# Patient Record
Sex: Female | Born: 1950 | Race: White | Hispanic: No | Marital: Married | Smoking: Former smoker
Health system: Southern US, Community
[De-identification: ages and names within clinical notes are randomized; demographics above are authoritative.]

## PROBLEM LIST (undated history)

## (undated) DIAGNOSIS — Z973 Presence of spectacles and contact lenses: Secondary | ICD-10-CM

## (undated) DIAGNOSIS — E785 Hyperlipidemia, unspecified: Secondary | ICD-10-CM

## (undated) DIAGNOSIS — K219 Gastro-esophageal reflux disease without esophagitis: Secondary | ICD-10-CM

## (undated) DIAGNOSIS — K08109 Complete loss of teeth, unspecified cause, unspecified class: Secondary | ICD-10-CM

## (undated) DIAGNOSIS — C801 Malignant (primary) neoplasm, unspecified: Secondary | ICD-10-CM

## (undated) DIAGNOSIS — I209 Angina pectoris, unspecified: Secondary | ICD-10-CM

## (undated) DIAGNOSIS — E119 Type 2 diabetes mellitus without complications: Secondary | ICD-10-CM

## (undated) DIAGNOSIS — R0602 Shortness of breath: Secondary | ICD-10-CM

## (undated) DIAGNOSIS — E669 Obesity, unspecified: Secondary | ICD-10-CM

## (undated) DIAGNOSIS — C44311 Basal cell carcinoma of skin of nose: Secondary | ICD-10-CM

## (undated) DIAGNOSIS — M1990 Unspecified osteoarthritis, unspecified site: Secondary | ICD-10-CM

## (undated) DIAGNOSIS — I2510 Atherosclerotic heart disease of native coronary artery without angina pectoris: Principal | ICD-10-CM

## (undated) DIAGNOSIS — M797 Fibromyalgia: Secondary | ICD-10-CM

## (undated) DIAGNOSIS — C539 Malignant neoplasm of cervix uteri, unspecified: Secondary | ICD-10-CM

## (undated) DIAGNOSIS — I10 Essential (primary) hypertension: Secondary | ICD-10-CM

## (undated) DIAGNOSIS — Z972 Presence of dental prosthetic device (complete) (partial): Secondary | ICD-10-CM

## (undated) HISTORY — DX: Gastro-esophageal reflux disease without esophagitis: K21.9

## (undated) HISTORY — DX: Obesity, unspecified: E66.9

## (undated) HISTORY — DX: Hyperlipidemia, unspecified: E78.5

## (undated) HISTORY — DX: Malignant (primary) neoplasm, unspecified: C80.1

## (undated) HISTORY — DX: Essential (primary) hypertension: I10

## (undated) HISTORY — PX: CORONARY ANGIOPLASTY WITH STENT PLACEMENT: SHX49

## (undated) HISTORY — DX: Type 2 diabetes mellitus without complications: E11.9

## (undated) HISTORY — DX: Fibromyalgia: M79.7

## (undated) HISTORY — PX: FRACTURE SURGERY: SHX138

---

## 1977-10-31 HISTORY — PX: TUBAL LIGATION: SHX77

## 1984-10-31 DIAGNOSIS — C539 Malignant neoplasm of cervix uteri, unspecified: Secondary | ICD-10-CM

## 1984-10-31 HISTORY — DX: Malignant neoplasm of cervix uteri, unspecified: C53.9

## 1985-03-31 HISTORY — PX: CERVICAL CONE BIOPSY: SUR198

## 1992-07-01 HISTORY — PX: CARPAL TUNNEL RELEASE: SHX101

## 1998-06-11 ENCOUNTER — Other Ambulatory Visit: Admission: RE | Admit: 1998-06-11 | Discharge: 1998-06-11 | Payer: Self-pay | Admitting: Obstetrics and Gynecology

## 2000-04-06 ENCOUNTER — Other Ambulatory Visit: Admission: RE | Admit: 2000-04-06 | Discharge: 2000-04-06 | Payer: Self-pay | Admitting: Obstetrics and Gynecology

## 2002-01-22 ENCOUNTER — Other Ambulatory Visit: Admission: RE | Admit: 2002-01-22 | Discharge: 2002-01-22 | Payer: Self-pay | Admitting: Obstetrics and Gynecology

## 2003-01-27 ENCOUNTER — Encounter: Payer: Self-pay | Admitting: Emergency Medicine

## 2003-01-27 ENCOUNTER — Emergency Department (HOSPITAL_COMMUNITY): Admission: EM | Admit: 2003-01-27 | Discharge: 2003-01-27 | Payer: Self-pay | Admitting: Emergency Medicine

## 2003-08-04 ENCOUNTER — Emergency Department (HOSPITAL_COMMUNITY): Admission: EM | Admit: 2003-08-04 | Discharge: 2003-08-04 | Payer: Self-pay | Admitting: Emergency Medicine

## 2004-10-31 HISTORY — PX: SHOULDER OPEN ROTATOR CUFF REPAIR: SHX2407

## 2007-05-11 ENCOUNTER — Inpatient Hospital Stay (HOSPITAL_COMMUNITY): Admission: EM | Admit: 2007-05-11 | Discharge: 2007-05-15 | Payer: Self-pay | Admitting: Emergency Medicine

## 2007-05-11 ENCOUNTER — Ambulatory Visit: Payer: Self-pay | Admitting: Infectious Diseases

## 2008-05-31 DIAGNOSIS — C44311 Basal cell carcinoma of skin of nose: Secondary | ICD-10-CM

## 2008-05-31 HISTORY — PX: MOHS SURGERY: SHX181

## 2008-05-31 HISTORY — DX: Basal cell carcinoma of skin of nose: C44.311

## 2009-07-31 DIAGNOSIS — I219 Acute myocardial infarction, unspecified: Secondary | ICD-10-CM

## 2009-07-31 HISTORY — DX: Acute myocardial infarction, unspecified: I21.9

## 2009-08-06 ENCOUNTER — Ambulatory Visit: Payer: Self-pay | Admitting: Cardiology

## 2009-08-07 ENCOUNTER — Observation Stay (HOSPITAL_COMMUNITY): Admission: EM | Admit: 2009-08-07 | Discharge: 2009-08-08 | Payer: Self-pay | Admitting: Emergency Medicine

## 2009-09-11 ENCOUNTER — Emergency Department (HOSPITAL_COMMUNITY): Admission: EM | Admit: 2009-09-11 | Discharge: 2009-09-11 | Payer: Self-pay | Admitting: Emergency Medicine

## 2009-09-23 ENCOUNTER — Inpatient Hospital Stay (HOSPITAL_COMMUNITY): Admission: AD | Admit: 2009-09-23 | Discharge: 2009-09-24 | Payer: Self-pay | Admitting: Interventional Cardiology

## 2011-02-02 LAB — DIFFERENTIAL
Eosinophils Absolute: 0.2 10*3/uL (ref 0.0–0.7)
Lymphs Abs: 1.4 10*3/uL (ref 0.7–4.0)
Monocytes Absolute: 0.6 10*3/uL (ref 0.1–1.0)
Monocytes Relative: 8 % (ref 3–12)
Neutro Abs: 5.7 10*3/uL (ref 1.7–7.7)
Neutrophils Relative %: 72 % (ref 43–77)

## 2011-02-02 LAB — CBC
Hemoglobin: 12.6 g/dL (ref 12.0–15.0)
Hemoglobin: 13.4 g/dL (ref 12.0–15.0)
MCHC: 34.8 g/dL (ref 30.0–36.0)
MCHC: 35.2 g/dL (ref 30.0–36.0)
Platelets: 224 10*3/uL (ref 150–400)
Platelets: 258 10*3/uL (ref 150–400)
RDW: 13.9 % (ref 11.5–15.5)
RDW: 13.7 % (ref 11.5–15.5)

## 2011-02-02 LAB — GLUCOSE, CAPILLARY
Glucose-Capillary: 151 mg/dL — ABNORMAL HIGH (ref 70–99)
Glucose-Capillary: 165 mg/dL — ABNORMAL HIGH (ref 70–99)
Glucose-Capillary: 194 mg/dL — ABNORMAL HIGH (ref 70–99)

## 2011-02-02 LAB — APTT: aPTT: 24 seconds (ref 24–37)

## 2011-02-02 LAB — COMPREHENSIVE METABOLIC PANEL
ALT: 28 U/L (ref 0–35)
Albumin: 4 g/dL (ref 3.5–5.2)
Calcium: 9.7 mg/dL (ref 8.4–10.5)
Glucose, Bld: 205 mg/dL — ABNORMAL HIGH (ref 70–99)
Sodium: 135 mEq/L (ref 135–145)
Total Protein: 6.9 g/dL (ref 6.0–8.3)

## 2011-02-02 LAB — BASIC METABOLIC PANEL
BUN: 11 mg/dL (ref 6–23)
CO2: 26 mEq/L (ref 19–32)
Calcium: 8.9 mg/dL (ref 8.4–10.5)
Creatinine, Ser: 0.69 mg/dL (ref 0.4–1.2)
GFR calc non Af Amer: >60 mL/min (ref >60)
Glucose, Bld: 171 mg/dL — ABNORMAL HIGH (ref 70–99)

## 2011-02-02 LAB — POCT CARDIAC MARKERS
CKMB, poc: <1 ng/mL — ABNORMAL LOW (ref 1.0–8.0)
Myoglobin, poc: 68.7 ng/mL (ref 12–200)

## 2011-02-02 LAB — PROTIME-INR: INR: 1.03 (ref 0.00–1.49)

## 2011-02-03 LAB — CBC
HCT: 38.2 % (ref 36.0–46.0)
Hemoglobin: 13.2 g/dL (ref 12.0–15.0)
MCHC: 34.6 g/dL (ref 30.0–36.0)
MCV: 91 fL (ref 78.0–100.0)
Platelets: 218 10*3/uL (ref 150–400)
Platelets: 248 10*3/uL (ref 150–400)
RBC: 4.2 MIL/uL (ref 3.87–5.11)
RDW: 12.9 % (ref 11.5–15.5)
RDW: 13 % (ref 11.5–15.5)
WBC: 7.9 10*3/uL (ref 4.0–10.5)

## 2011-02-03 LAB — BASIC METABOLIC PANEL
BUN: 13 mg/dL (ref 6–23)
Calcium: 10 mg/dL (ref 8.4–10.5)
Chloride: 105 mEq/L (ref 96–112)
GFR calc Af Amer: >60 mL/min (ref >60)
GFR calc non Af Amer: >60 mL/min (ref >60)
Glucose, Bld: 334 mg/dL — ABNORMAL HIGH (ref 70–99)
Potassium: 3.7 mEq/L (ref 3.5–5.1)
Sodium: 136 mEq/L (ref 135–145)

## 2011-02-03 LAB — GLUCOSE, CAPILLARY
Glucose-Capillary: 147 mg/dL — ABNORMAL HIGH (ref 70–99)
Glucose-Capillary: 238 mg/dL — ABNORMAL HIGH (ref 70–99)
Glucose-Capillary: 240 mg/dL — ABNORMAL HIGH (ref 70–99)

## 2011-02-03 LAB — DIFFERENTIAL
Basophils Absolute: 0 10*3/uL (ref 0.0–0.1)
Eosinophils Absolute: 0.1 10*3/uL (ref 0.0–0.7)
Eosinophils Relative: 1 % (ref 0–5)

## 2011-02-03 LAB — CK TOTAL AND CKMB (NOT AT ARMC)
CK, MB: 2.3 ng/mL (ref 0.3–4.0)
Relative Index: INVALID (ref 0.0–2.5)
Total CK: 41 U/L (ref 7–177)

## 2011-02-03 LAB — COMPREHENSIVE METABOLIC PANEL
Albumin: 3.6 g/dL (ref 3.5–5.2)
BUN: 10 mg/dL (ref 6–23)
Chloride: 103 mEq/L (ref 96–112)
Creatinine, Ser: 0.68 mg/dL (ref 0.4–1.2)
GFR calc non Af Amer: >60 mL/min (ref >60)
Total Bilirubin: 0.7 mg/dL (ref 0.3–1.2)

## 2011-02-03 LAB — CARDIAC PANEL(CRET KIN+CKTOT+MB+TROPI)
CK, MB: 10.7 ng/mL — ABNORMAL HIGH (ref 0.3–4.0)
CK, MB: 6.9 ng/mL — ABNORMAL HIGH (ref 0.3–4.0)
Relative Index: INVALID (ref 0.0–2.5)
Total CK: 82 U/L (ref 7–177)
Troponin I: 0.41 ng/mL — ABNORMAL HIGH (ref 0.00–0.06)

## 2011-02-03 LAB — LIPID PANEL
LDL Cholesterol: 161 mg/dL — ABNORMAL HIGH (ref 0–99)
Triglycerides: 222 mg/dL — ABNORMAL HIGH (ref <150)
VLDL: 44 mg/dL — ABNORMAL HIGH (ref 0–40)

## 2011-02-03 LAB — PROTIME-INR
INR: 1.06 (ref 0.00–1.49)
Prothrombin Time: 13.7 seconds (ref 11.6–15.2)

## 2011-02-03 LAB — TROPONIN I: Troponin I: 0.07 ng/mL — ABNORMAL HIGH (ref 0.00–0.06)

## 2011-02-03 LAB — POCT CARDIAC MARKERS: Troponin i, poc: <0.05 ng/mL (ref 0.00–0.09)

## 2011-03-15 NOTE — H&P (Signed)
NAME:  Suzanne Watkins, Suzanne Watkins NO.:  [id number]   MEDICAL RECORD NO.:  [id number]          PATIENT TYPE:  INP   LOCATION:  2302                         FACILITY:  [facility]   PHYSICIAN:  Hind I Elsaid, MD      DATE OF BIRTH:  06-22-1951   DATE OF ADMISSION:  05/11/2007  DATE OF DISCHARGE:                              HISTORY & PHYSICAL   CHIEF COMPLAINT:  Altered mental status, fever and confusion.   HISTORY OF PRESENT ILLNESS:  This is a 60 year old female with morbid  obesity.  History is very limited as the patient has altered mental  status and is confused.  We tried to contact the husband while in the ER  but not able to obtain further history.  History obtained from EMS and  ER chart.  When patient arrived EMS had provided care and the report  indicated the patient complained of severe headache for 2 days.  Also  started with a very high fever of 104.  She also had slurred speech.  No  drooping, no abnormal drift and no weakness or numbness of her muscles.  At the ER the patient became more confused, more agitated.  History of  meningitis versus encephalitis.  We cannot obtain any troubling history.  No history of rash in the record.  Also as per record the patient had an  unsteady gait.  No further history can be obtained.   MEDICATIONS:  1. Darvon dose unknown.  2. Nexium dose unknown.   ALLERGIES:  NO KNOWN DRUG ALLERGIES.   PAST SURGICAL HISTORY:  Unobtainable.   PHYSICAL EXAMINATION:  GENERAL:  Patient only examined in the ER.  She  was very confused.  Very agitated.  She received fentanyl and Versed and  still confused and still very agitated.  Very morbid obese female,  disoriented.  She moves all extremities.  I could not appreciate any  abnormality on the cranial nerves.  VITAL SIGNS:  Temperature 101.6.  Pulse rate 120.  Regular rate and  rhythm.  Blood pressure 141/78.  Saturation 95%.  HEENT:  Extraocular muscle movements within normal.  LUNGS:   Normal vesicular breathing.  HEART:  S1, S2.  Tachycardiac.  No gallop, no murmur.  ABDOMEN:  Distended.  Bowel sounds positive.  EXTREMITIES:  No lower limb edema.  Peripheral pulses intact.   LABORATORY DATA:  White blood cell count 22.3, hemoglobin 13.6,  hematocrit 39.3, platelets 218, sodium 133, potassium 3.5, chloride 98,  carbon dioxide 24, BUN 9, creatinine 0.9,  calcium 9.5, total bilirubin  6.8, albumin 3.5, AST 29, ALT 41.  Chest x-ray no consolidations present  to suggest pneumonia.  Bibasilar linear opacities most consistent with  atelectasis.  CT of the head showed no acute intracranial process.  Again, exam is limited by patient motion.   ASSESSMENT/PLAN:  1. Febrile illness with altered mental status.  2. Possible early meningitis versus encephalitis.   The plan is to get a lumbar puncture.  Send sample for routine white  blood cells due to bacteria culture and gram stain, fungal cultures and  gram stain, VDRL  west Nile virus.  Live antibodies.  Herpes simplex  virus 1 and 2, BCR, white blood cells and differential.  [facility]  Spotted Fever is on the differential.  Will send serology for [facility] Spotted Fever.  We will start the patierocephine 2 grams IV q.  12 hours, acyclovir 700 mg IV q. 8 hours, vancomycin 1 gram IV q. 12  hours.  Will consult ID.  Further recommendations regarding the  antibiotic plan as per lumbar puncture results.  Possibility will  consider MRI of the brain if the above was negative.  We will get  urinalysis and culture.  For patient agitation will continue with Haldol  and Ativan at this time and we will admit the patient for step-down.  Hypertension.  We will hold the blood pressure medications.  DVT and GI  prophylaxis.  Further dictation to be updated if we contact the husband.      Hind Bosie Helper, MD  Electronically Signed     HIE/MEDQ  D:  05/11/2007  T:  05/12/2007  Job:  [id number]

## 2011-03-15 NOTE — Discharge Summary (Signed)
NAME:  Suzanne Watkins, Suzanne Watkins NO.:  [id number]   MEDICAL RECORD NO.:  [id number]          PATIENT TYPE:  INP   LOCATION:  2038                         FACILITY:  [facility]   PHYSICIAN:  Beckey Rutter, MD  DATE OF BIRTH:  04-Oct-1951   DATE OF ADMISSION:  05/11/2007  DATE OF DISCHARGE:  05/15/2007                               DISCHARGE SUMMARY   CHIEF COMPLAINT ON PRESENTATION:  Altered mental status and  aggressiveness.   HISTORY OF PRESENT ILLNESS:  This is a 60 year old Caucasian female with  past medical history significant for diabetes, hypertension and GERD who  presented with altered mental status and fever.  The patient, because of  her presentation, had undergone lumbar puncture which was essentially  negative.  The patient then was continued on antibiotics after the blood  culture grew E-coli and the second one coagulase negative staph aureus.  Now the patient has stable blood count that returned to normal and is to  be discharged.   HOSPITAL CONSULTATION:  Infectious Disease, Dr. Ninetta Lights.   DISCHARGE MEDICATIONS:  1. Diovan dose as before admission.  2. Nexium 40 mg p.o. daily.  3. Augmentin 875 mg p.o. b.i.d. for 10 days.   DISCHARGE DIAGNOSES:  1. Sepsis with altered mental status secondary to E-coli.  2. Urinary tract infection.  3. Diabetes mellitus.   SECONDARY DIAGNOSES:  1. Hypertension.  2. GERD.   DISCHARGE/PLAN:  1. The patient is stable for discharge today to continue on Augmentin      875 mg p.o. for 10 more days.  2. Follow up with her physician as recommended.      Beckey Rutter, MD  Electronically Signed     EME/MEDQ  D:  05/15/2007  T:  05/16/2007  Job:  [id number]

## 2011-08-16 LAB — CULTURE, BLOOD (ROUTINE X 2)
Culture: NO GROWTH
Culture: NO GROWTH

## 2011-08-16 LAB — URINALYSIS, ROUTINE W REFLEX MICROSCOPIC
Glucose, UA: 100 — AB
Nitrite: NEGATIVE
Protein, ur: 30 — AB
Urobilinogen, UA: 0.2
pH: 6.5

## 2011-08-16 LAB — CBC
HCT: 33.5 — ABNORMAL LOW
HCT: 34 — ABNORMAL LOW
Hemoglobin: 11.8 — ABNORMAL LOW
Hemoglobin: 13.6
MCHC: 34.3
MCV: 91.5
MCV: 91.9
Platelets: 217
Platelets: 220
RBC: 3.42 — ABNORMAL LOW
RBC: 4.33
RDW: 13.1
RDW: 13.4
RDW: 13.4
RDW: 13.8
WBC: 17 — ABNORMAL HIGH
WBC: 20.2 — ABNORMAL HIGH

## 2011-08-16 LAB — URINE CULTURE
Colony Count: NO GROWTH
Culture: NO GROWTH
Special Requests: NEGATIVE

## 2011-08-16 LAB — POCT I-STAT 3, ART BLOOD GAS (G3+)
Acid-base deficit: 2
Bicarbonate: 21.7
O2 Saturation: 96
TCO2: 23
pH, Arterial: 7.43 — ABNORMAL HIGH

## 2011-08-16 LAB — COMPREHENSIVE METABOLIC PANEL
ALT: 41 — ABNORMAL HIGH
ALT: 48 — ABNORMAL HIGH
AST: 29
AST: 45 — ABNORMAL HIGH
Albumin: 2.3 — ABNORMAL LOW
Alkaline Phosphatase: 80
Calcium: 8.1 — ABNORMAL LOW
Creatinine, Ser: 0.79
GFR calc Af Amer: >60
GFR calc Af Amer: >60
GFR calc non Af Amer: >60
Glucose, Bld: 260 — ABNORMAL HIGH
Potassium: 3.5
Sodium: 133 — ABNORMAL LOW
Sodium: 137
Total Protein: 5.4 — ABNORMAL LOW
Total Protein: 6.8

## 2011-08-16 LAB — DIFFERENTIAL
Basophils Relative: 0
Eosinophils Absolute: 0
Eosinophils Relative: 0
Monocytes Absolute: 2 — ABNORMAL HIGH
Monocytes Relative: 9
Neutrophils Relative %: 83 — ABNORMAL HIGH

## 2011-08-16 LAB — CSF CELL COUNT WITH DIFFERENTIAL: Tube #: 3

## 2011-08-16 LAB — CSF CULTURE W GRAM STAIN: Culture: NO GROWTH

## 2011-08-16 LAB — ARBOVIRUS PANEL, ~~LOC~~ LAB

## 2011-08-16 LAB — BASIC METABOLIC PANEL
Calcium: 8.5
GFR calc Af Amer: >60
GFR calc non Af Amer: >60
Potassium: 3.9
Sodium: 135

## 2011-08-16 LAB — RAPID URINE DRUG SCREEN, HOSP PERFORMED
Amphetamines: NOT DETECTED
Benzodiazepines: POSITIVE — AB
Cocaine: NOT DETECTED
Opiates: NOT DETECTED
Tetrahydrocannabinol: NOT DETECTED

## 2011-08-16 LAB — APTT: aPTT: 23 — ABNORMAL LOW

## 2011-08-16 LAB — HSV PCR: HSV, PCR: NEGATIVE

## 2011-08-16 LAB — B. BURGDORFI ANTIBODIES: B burgdorferi Ab IgG+IgM: 0.05

## 2011-08-16 LAB — PROTIME-INR: INR: 1.1

## 2011-08-16 LAB — ROCKY MTN SPOTTED FVR AB, IGM-BLOOD: RMSF IgM: 0.14

## 2011-08-16 LAB — URINE MICROSCOPIC-ADD ON

## 2011-08-16 LAB — FUNGUS CULTURE W SMEAR

## 2012-03-16 ENCOUNTER — Encounter: Payer: Self-pay | Admitting: *Deleted

## 2012-03-16 ENCOUNTER — Encounter: Payer: BC Managed Care – PPO | Attending: Family Medicine | Admitting: *Deleted

## 2012-03-16 DIAGNOSIS — E119 Type 2 diabetes mellitus without complications: Secondary | ICD-10-CM | POA: Insufficient documentation

## 2012-03-16 DIAGNOSIS — Z713 Dietary counseling and surveillance: Secondary | ICD-10-CM | POA: Insufficient documentation

## 2012-03-16 NOTE — Progress Notes (Addendum)
  Medical Nutrition Therapy:  Appt start time: 1000  End time:  1100.  Assessment:  T2DM.  Pt here with A1c of 10.0% for assessment. States it is very hard to give up fatty foods and control diet. Does not exercise, but takes stairs at work and parks farther from office. 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 Checks FBG 2-3 times/week and reports FBGs of 190-200s (mg), compared to 280-300s in March 2013. Consumes excessive CHO and high fat foods daily. No pain reported.   MEDICATIONS: See medication list.   DIETARY INTAKE:  Usual eating pattern includes 3 meals and 1-2 snacks per day.  24-hr recall:  B ( AM): Special K flat bread sausage, egg, and cheese  Snk ( AM): NONE or oatmeal cookies or banana w/ peanut butter  L ( PM): Lean Cuisine Snk ( PM): Pizza rolls OR breaded chicken strip D (7 PM): Can of ravioli OR ham and FF cheese sandwich on wheat, handful of chips Snk ( PM): Skinny Cow ice cream OR 2-3 mini chocolate donuts Beverages: Diet coke, water  Usual physical activity:  No structured activity noted.   Estimated energy needs: 1200 calories 135 g carbohydrates 85-90 g protein 35-40 g fat  Progress Towards Goal(s):  In progress.   Nutritional Diagnosis:  [place]-2.1 Inpaired nutrition utilization As related to glucose metabolism.  As evidenced by recent A1c 10.0% and MD referral for assessment and education.    Intervention:  Nutrition education.  Handouts given during visit include:  Living Well with Diabetes - [employer]  Target Blood Glucose Levels  Monitoring/Evaluation:  Dietary intake, exercise, A1c, BG trends, and body weight in 4-6 week(s).

## 2012-03-16 NOTE — Patient Instructions (Addendum)
Diabetes Self-Care  Feet  Daily inspection of feet (look for red/white spots, signs of infection, blisters, dryness)  Dryness of skin: Bathe daily and use lotion containing lanolin  Dry/Callus Areas: Use pumice stone to remove callus tissue  Eyes  Yearly dilated eye exam  Teeth and Mouth  Brush and Floss Daily  Cleaning/Exam every 6 months  Monitoring Blood Glucose  Fasting glucose (80-120)  Check daily  Pre-Meal (80-120)  2 hrs after the first bit of a meal (80-160)  Check 2-3 times a week  Bedtime (100-140)  HgbA1C  Check every 3-6 months per MD  Goal <7%  Exercise  Increase aerobic exercise (walking, biking, hiking, swimming)   Every day for 20-30 min  NUTRITION  Carbohydrates  Convert to glucose  Consume carbohydrates as a part of meals and snacks  Avoid the use of concentrated sweets as they promote the rapid rise in blood glucose (sweet tea, juice, regular soda, and sweetened beverages)  Choose sugar substitutes for sweetening  Fiber  Slow the uptake of glucose from carbohydrate  Fibrous foods include whole grain breads, cereals, vegetables, beans, peas, and lentils  Use food label to determine foods highest in fiber  Breads should have >2 grams per serving  Cereals should have >3 grams per serving  Sugar  Aim for 0-9 grams per serving  Choose your use of foods containing higher sugar levels wisely  Counting carbohydrates  Breakfast =  30 grams carbohydrate (2 carb choices)  AM Snack = 15 grams carbohydrate + protein (1 carb choices)  Lunch =  30 grams carbohydrate (2 carb choices)  PM Snack = 15 grams carbohydrate + protein (1 carb choices)  Dinner =  30 grams carbohydrate (2 carb choices)  Bedtime Snack = 0-15 grams carbohydrate + protein (0-1 carb choices)  OR   Plate Method for Carbohydrate Control  Use an 8 inch plate  Make 1/2 plate non-starchy vegetables  Make 1/4 plate carbohydrate choice (high  fiber)  Make 1/4 plate lean protein  Non-Starchy Vegetables  High in fiber and low in starches/calories  Try to have a large serving a lunch/dinner  Considered a "FREE" choice  High in vitamins, minerals, and anti-oxidants  Deep rich colored vegetables  Proteins  Build and repair tissues and do not contain glucose  Watch for added carbohydrate to protein (such as gravy, breading, sauces)  Choose lean proteins  Bake, broil, grill, and roast proteins,   Serving size = Size of the palm of the hand  Have a protein source at all meals and snacks  Fats  Keep at 1-2 servings per meal  Measure fat servings  Choose reduced fat or low-fat varieties of salad dressings and condiments  Use food label for serving sizes

## 2012-03-17 ENCOUNTER — Encounter: Payer: Self-pay | Admitting: *Deleted

## 2012-04-27 ENCOUNTER — Encounter: Payer: Self-pay | Admitting: *Deleted

## 2012-04-27 ENCOUNTER — Encounter: Payer: BC Managed Care – PPO | Attending: Family Medicine | Admitting: *Deleted

## 2012-04-27 VITALS — Ht 63.0 in | Wt 193.5 lb

## 2012-04-27 DIAGNOSIS — E119 Type 2 diabetes mellitus without complications: Secondary | ICD-10-CM

## 2012-04-27 DIAGNOSIS — Z713 Dietary counseling and surveillance: Secondary | ICD-10-CM | POA: Insufficient documentation

## 2012-04-27 NOTE — Progress Notes (Signed)
  Medical Nutrition Therapy:  Appt start time: 1000  End time:  1100.  Primary Concerns Today:  T2DM; F/U.  Checks FBG 6-7 times/week and reports 140-160 mg and reports pre-prandial BGs (before lunch) of 117-120 (mg). Has decreased intake of CHO and high fat foods and increased exercise/ADLs.   MEDICATIONS:  No changes reported.    DIETARY INTAKE:  Usual eating pattern includes 3 meals and 1-2 snacks per day.  24-hr recall:  B (AM): Special K flat bread sausage, egg, and cheese sandwich OR Smart Ones egg scramble Snk (AM): Protein bar (cashew/almond trail mix bar)  L ( PM): Lean Cuisine or Smart Ones Snk ( PM): Totinos pizza rolls (6) D (7 PM): Grilled chicken, twice baked potato, and green beans OR sandwich on whole wht bread Snk ( PM): 1 med sized chocolate donut OR glass of chocolate milk Beverages: Diet coke, water  Usual physical activity:  Walks 1-2 times/week; parks far away from office to increase activity; Richard Simmons video   Estimated energy needs: 1200 calories 135 g carbohydrates 85-90 g protein 35-40 g fat  Progress Towards Goal(s):  In progress.   Nutritional Diagnosis:  [place]-2.1 Inpaired nutrition utilization As related to glucose metabolism.  As evidenced by recent A1c 10.0% and MD referral for assessment and education.    Intervention:  Nutrition education.  Monitoring/Evaluation:  Dietary intake, exercise, A1c, BG trends, and body weight in 3 months.

## 2012-04-27 NOTE — Patient Instructions (Signed)
   Continue previous goals.   Follow up in 3-6 months or as needed.

## 2012-11-25 ENCOUNTER — Ambulatory Visit (INDEPENDENT_AMBULATORY_CARE_PROVIDER_SITE_OTHER): Payer: BC Managed Care – PPO | Admitting: Emergency Medicine

## 2012-11-25 VITALS — BP 136/78 | HR 102 | Temp 98.7°F | Resp 18 | Ht 63.0 in | Wt 187.0 lb

## 2012-11-25 DIAGNOSIS — N12 Tubulo-interstitial nephritis, not specified as acute or chronic: Secondary | ICD-10-CM

## 2012-11-25 DIAGNOSIS — M549 Dorsalgia, unspecified: Secondary | ICD-10-CM

## 2012-11-25 LAB — POCT UA - MICROSCOPIC ONLY
Casts, Ur, LPF, POC: NEGATIVE
Crystals, Ur, HPF, POC: NEGATIVE
Yeast, UA: NEGATIVE

## 2012-11-25 LAB — POCT URINALYSIS DIPSTICK
Protein, UA: NEGATIVE
Spec Grav, UA: 1.015
Urobilinogen, UA: 0.2

## 2012-11-25 MED ORDER — FLUCONAZOLE 150 MG PO TABS: 150.0000 mg | ORAL_TABLET | Freq: Once | ORAL | Status: DC

## 2012-11-25 MED ORDER — SULFAMETHOXAZOLE-TRIMETHOPRIM 800-160 MG PO TABS: 1.0000 | ORAL_TABLET | Freq: Two times a day (BID) | ORAL | Status: DC

## 2012-11-25 NOTE — Patient Instructions (Addendum)
Pyelonephritis, Adult  Pyelonephritis is a kidney infection. In general, there are 2 main types of pyelonephritis:   Infections that come on quickly without any warning (acute pyelonephritis).   Infections that persist for a long period of time (chronic pyelonephritis).  CAUSES   Two main causes of pyelonephritis are:   Bacteria traveling from the bladder to the kidney. This is a problem especially in pregnant women. The urine in the bladder can become filled with bacteria from multiple causes, including:   Inflammation of the prostate gland (prostatitis).   Sexual intercourse in females.   Bladder infection (cystitis).   Bacteria traveling from the bloodstream to the tissue part of the kidney.  Problems that may increase your risk of getting a kidney infection include:   Diabetes.   Kidney stones or bladder stones.   Cancer.   Catheters placed in the bladder.   Other abnormalities of the kidney or ureter.  SYMPTOMS    Abdominal pain.   Pain in the side or flank area.   Fever.   Chills.   Upset stomach.   Blood in the urine (dark urine).   Frequent urination.   Strong or persistent urge to urinate.   Burning or stinging when urinating.  DIAGNOSIS   Your caregiver may diagnose your kidney infection based on your symptoms. A urine sample may also be taken.  TREATMENT   In general, treatment depends on how severe the infection is.    If the infection is mild and caught early, your caregiver may treat you with oral antibiotics and send you home.   If the infection is more severe, the bacteria may have gotten into the bloodstream. This will require intravenous (IV) antibiotics and a hospital stay. Symptoms may include:   High fever.   Severe flank pain.   Shaking chills.   Even after a hospital stay, your caregiver may require you to be on oral antibiotics for a period of time.   Other treatments may be required depending upon the cause of the infection.  HOME CARE INSTRUCTIONS    Take your  antibiotics as directed. Finish them even if you start to feel better.   Make an appointment to have your urine checked to make sure the infection is gone.   Drink enough fluids to keep your urine clear or pale yellow.   Take medicines for the bladder if you have urgency and frequency of urination as directed by your caregiver.  SEEK IMMEDIATE MEDICAL CARE IF:    You have a fever or persistent symptoms for more than 2-3 days.   You have a fever and your symptoms suddenly get worse.   You are unable to take your antibiotics or fluids.   You develop shaking chills.   You experience extreme weakness or fainting.   There is no improvement after 2 days of treatment.  MAKE SURE YOU:   Understand these instructions.   Will watch your condition.   Will get help right away if you are not doing well or get worse.  Document Released: 10/17/2005 Document Revised: 04/17/2012 Document Reviewed: 03/23/2011  Exit[employer] Patient Information 2013 [employer].

## 2012-11-25 NOTE — Progress Notes (Signed)
[facility] [address], [place] [address] [phone]  Date:  11/25/2012   Name:  Suzanne Watkins   DOB:  April 06, 1951   MRN:  [id number]  PCP:  Paulino Rily, MD    Chief Complaint: Pyelonephritis, Back Pain and Headache   History of Present Illness:  Suzanne Watkins is a 62 y.o. very pleasant female patient who presents with the following:  Ill since Tuesday with back pain and fever of 102-103.  Some chills, nausea and a couple rounds of vomiting.  No stool change.  No cough or coryza.  Has dysuria and urgency.  No abdominal pain.  Feels fatigued and malaise and myalgias.  There is no problem list on file for this patient.   Past Medical History  Diagnosis Date  . Hypertension   . Hyperlipidemia   . GERD (gastroesophageal reflux disease)   . Obesity   . Fibromyalgia   . Myocardial infarction 2010    Minor (per pt)  . Diabetes mellitus   . Heart attack 2010    pt states that it was a minor heart attack, w/ no damage    Past Surgical History  Procedure Date  . Cervical cone biopsy 03/1985  . Tubal ligation 1979  . Carpal tunnel release 07/1992    Right wrist  . Rotator cuff repair 1/06    Right shoulder  . Coronary angioplasty with stent placement 07/2009, 08/2009    History  Substance Use Topics  . Smoking status: Former Games developer  . Smokeless tobacco: Never Used  . Alcohol Use: No    Family History  Problem Relation Age of Onset  . Heart disease Mother   . Hyperlipidemia Mother   . Cancer Father     Throat  . Hyperlipidemia Brother   . Hypertension Brother   . Drug abuse Brother   . Diabetes Daughter   . Drug abuse Daughter   . Heart disease Daughter   . Heart disease Paternal Grandmother   . Stroke Paternal Grandfather   . Lupus Daughter     No Known Allergies  Medication list has been reviewed and updated.  Current Outpatient Prescriptions on File Prior to Visit  Medication Sig Dispense Refill  . aspirin 81 MG tablet  Take 81 mg by mouth daily.      Marland Kitchen atorvastatin (LIPITOR) 80 MG tablet Take 80 mg by mouth daily.      . metoprolol succinate (TOPROL-XL) 25 MG 24 hr tablet Take 25 mg by mouth daily.      . Multiple Vitamin (MULTIVITAMIN) capsule Take 1 capsule by mouth daily.      Marland Kitchen omeprazole (PRILOSEC) 20 MG capsule Take 20 mg by mouth daily.      . sitaGLIPtan-metformin (JANUMET) 50-1000 MG per tablet Take 1 tablet by mouth 2 (two) times daily with a meal.      . Ticagrelor (BRILINTA) 90 MG TABS tablet Take 90 mg by mouth 2 (two) times daily.      . valsartan (DIOVAN) 80 MG tablet Take 80 mg by mouth daily.        Review of Systems:  As per HPI, otherwise negative.    Physical Examination: Filed Vitals:   11/25/12 0900  BP: 136/78  Pulse: 102  Temp: 98.7 F (37.1 C)  Resp: 18   Filed Vitals:   11/25/12 0900  Height: 5\' 3"  (1.6 m)  Weight: 187 lb (84.823 kg)   Body mass index is 33.13 kg/(m^2). Ideal Body Weight:  Weight in (lb) to have BMI = 25: 140.8   GEN: WDWN, ill appearing, Non-toxic, A & O x 3 HEENT: Atraumatic, Normocephalic. Neck supple. No masses, No LAD. Ears and Nose: No external deformity. CV: RRR, No M/G/R. No JVD. No thrill. No extra heart sounds. PULM: CTA B, no wheezes, crackles, rhonchi. No retractions. No resp. distress. No accessory muscle use. ABD: S, NT, ND, +BS. No rebound. No HSM. Right CVA tenderness EXTR: No c/c/e NEURO Normal gait.  PSYCH: Normally interactive. Conversant. Not depressed or anxious appearing.  Calm demeanor.    Assessment and Plan: Pyelonephritis Larene Pickett, MD  Results for orders placed in visit on 11/25/12  POCT URINALYSIS DIPSTICK      Component Value Range   Color, UA yellow     Clarity, UA cloudy     Glucose, UA neg     Bilirubin, UA neg     Ketones, UA neg     Spec Grav, UA 1.015     Blood, UA trace     pH, UA 5.5     Protein, UA neg     Urobilinogen, UA 0.2     Nitrite, UA positive     Leukocytes, UA  Trace    POCT UA - MICROSCOPIC ONLY      Component Value Range   WBC, Ur, HPF, POC 2-3     RBC, urine, microscopic neg     Bacteria, U Microscopic 4+     Mucus, UA neg     Epithelial cells, urine per micros 0-1     Crystals, Ur, HPF, POC neg     Casts, Ur, LPF, POC neg     Yeast, UA neg

## 2013-05-18 ENCOUNTER — Ambulatory Visit (INDEPENDENT_AMBULATORY_CARE_PROVIDER_SITE_OTHER): Payer: BC Managed Care – PPO | Admitting: Family Medicine

## 2013-05-18 VITALS — BP 150/74 | HR 80 | Temp 97.3°F | Resp 16 | Ht 63.0 in | Wt 188.0 lb

## 2013-05-18 DIAGNOSIS — I10 Essential (primary) hypertension: Secondary | ICD-10-CM

## 2013-05-18 DIAGNOSIS — E785 Hyperlipidemia, unspecified: Secondary | ICD-10-CM

## 2013-05-18 DIAGNOSIS — E119 Type 2 diabetes mellitus without complications: Secondary | ICD-10-CM | POA: Insufficient documentation

## 2013-05-18 DIAGNOSIS — Z79899 Other long term (current) drug therapy: Secondary | ICD-10-CM

## 2013-05-18 LAB — MICROALBUMIN / CREATININE URINE RATIO: Microalb, Ur: 2.28 mg/dL — ABNORMAL HIGH (ref 0.00–1.89)

## 2013-05-18 MED ORDER — SITAGLIPTIN PHOS-METFORMIN HCL 50-1000 MG PO TABS: 1.0000 | ORAL_TABLET | Freq: Two times a day (BID) | ORAL | Status: DC

## 2013-05-18 NOTE — Patient Instructions (Addendum)
Diabetes Meal Planning Guide The diabetes meal planning guide is a tool to help you plan your meals and snacks. It is important for people with diabetes to manage their blood glucose (sugar) levels. Choosing the right foods and the right amounts throughout your day will help control your blood glucose. Eating right can even help you improve your blood pressure and reach or maintain a healthy weight. CARBOHYDRATE COUNTING MADE EASY When you eat carbohydrates, they turn to sugar. This raises your blood glucose level. Counting carbohydrates can help you control this level so you feel better. When you plan your meals by counting carbohydrates, you can have more flexibility in what you eat and balance your medicine with your food intake. Carbohydrate counting simply means adding up the total amount of carbohydrate grams in your meals and snacks. Try to eat about the same amount at each meal. Foods with carbohydrates are listed below. Each portion below is 1 carbohydrate serving or 15 grams of carbohydrates. Ask your dietician how many grams of carbohydrates you should eat at each meal or snack. Grains and Starches  1 slice bread.   English muffin or hotdog/hamburger bun.   cup cold cereal (unsweetened).   cup cooked pasta or rice.   cup starchy vegetables (corn, potatoes, peas, beans, winter squash).  1 tortilla (6 inches).   bagel.  1 waffle or pancake (size of a CD).   cup cooked cereal.  4 to 6 small crackers. *Whole grain is recommended. Fruit  1 cup fresh unsweetened berries, melon, papaya, pineapple.  1 small fresh fruit.   banana or mango.   cup fruit juice (4 oz unsweetened).   cup canned fruit in natural juice or water.  2 tbs dried fruit.  12 to 15 grapes or cherries. Milk and Yogurt  1 cup fat-free or 1% milk.  1 cup soy milk.  6 oz light yogurt with sugar-free sweetener.  6 oz low-fat soy yogurt.  6 oz plain yogurt. Vegetables  1 cup raw or  cup  cooked is counted as 0 carbohydrates or a "free" food.  If you eat 3 or more servings at 1 meal, count them as 1 carbohydrate serving. Other Carbohydrates   oz chips or pretzels.   cup ice cream or frozen yogurt.   cup sherbet or sorbet.  2 inch square cake, no frosting.  1 tbs honey, sugar, jam, jelly, or syrup.  2 small cookies.  3 squares of graham crackers.  3 cups popcorn.  6 crackers.  1 cup broth-based soup.  Count 1 cup casserole or other mixed foods as 2 carbohydrate servings.  Foods with less than 20 calories in a serving may be counted as 0 carbohydrates or a "free" food. You may want to purchase a book or computer software that lists the carbohydrate gram counts of different foods. In addition, the nutrition facts panel on the labels of the foods you eat are a good source of this information. The label will tell you how big the serving size is and the total number of carbohydrate grams you will be eating per serving. Divide this number by 15 to obtain the number of carbohydrate servings in a portion. Remember, 1 carbohydrate serving equals 15 grams of carbohydrate. SERVING SIZES Measuring foods and serving sizes helps you make sure you are getting the right amount of food. The list below tells how big or small some common serving sizes are.  1 oz.........4 stacked dice.  3 oz.........Deck of cards.  1 tsp........Tip   of little finger.  1 tbs......Marland KitchenMarland KitchenThumb.  2 tbs.......Marland KitchenGolf ball.   cup......Marland KitchenHalf of a fist.  1 cup.......Marland KitchenA fist. SAMPLE DIABETES MEAL PLAN Below is a sample meal plan that includes foods from the grain and starches, dairy, vegetable, fruit, and meat groups. A dietician can individualize a meal plan to fit your calorie needs and tell you the number of servings needed from each food group. However, controlling the total amount of carbohydrates in your meal or snack is more important than making sure you include all of the food groups at every  meal. You may interchange carbohydrate containing foods (dairy, starches, and fruits). The meal plan below is an example of a 2000 calorie diet using carbohydrate counting. This meal plan has 17 carbohydrate servings. Breakfast  1 cup oatmeal (2 carb servings).   cup light yogurt (1 carb serving).  1 cup blueberries (1 carb serving).   cup almonds. Snack  1 large apple (2 carb servings).  1 low-fat string cheese stick. Lunch  Chicken breast salad.  1 cup spinach.   cup chopped tomatoes.  2 oz chicken breast, sliced.  2 tbs low-fat [place] dressing.  12 whole-wheat crackers (2 carb servings).  12 to 15 grapes (1 carb serving).  1 cup low-fat milk (1 carb serving). Snack  1 cup carrots.   cup hummus (1 carb serving). Dinner  3 oz broiled salmon.  1 cup brown rice (3 carb servings). Snack  1  cups steamed broccoli (1 carb serving) drizzled with 1 tsp olive oil and lemon juice.  1 cup light pudding (2 carb servings). DIABETES MEAL PLANNING WORKSHEET Your dietician can use this worksheet to help you decide how many servings of foods and what types of foods are right for you.  BREAKFAST Food Group and Servings / Carb Servings Grain/Starches __________________________________ Dairy __________________________________________ Vegetable ______________________________________ Fruit ___________________________________________ Meat __________________________________________ Fat ____________________________________________ LUNCH Food Group and Servings / Carb Servings Grain/Starches ___________________________________ Dairy ___________________________________________ Fruit ____________________________________________ Meat ___________________________________________ Fat _____________________________________________ Wonda Cheng Food Group and Servings / Carb Servings Grain/Starches ___________________________________ Dairy  ___________________________________________ Fruit ____________________________________________ Meat ___________________________________________ Fat _____________________________________________ SNACKS Food Group and Servings / Carb Servings Grain/Starches ___________________________________ Dairy ___________________________________________ Vegetable _______________________________________ Fruit ____________________________________________ Meat ___________________________________________ Fat _____________________________________________ DAILY TOTALS Starches _________________________ Vegetable ________________________ Fruit ____________________________ Dairy ____________________________ Meat ____________________________ Fat ______________________________ Document Released: 07/14/2005 Document Revised: 01/09/2012 Document Reviewed: 05/25/2009 [employer] Patient Information 2014 [employer]. Diabetes, Eating Away From Home Sometimes, you might eat in a restaurant or have meals that are prepared by someone else. You can enjoy eating out. However, the portions in restaurants may be much larger than needed. Listed below are some ideas to help you choose foods that will keep your blood glucose (sugar) in better control.  TIPS FOR EATING OUT  Know your meal plan and how many carbohydrate servings you should have at each meal. You may wish to carry a copy of your meal plan in your purse or wallet. Learn the foods included in each food group.  Make a list of restaurants near you that offer healthy choices. Take a copy of the carry-out menus to see what they offer. Then, you can plan what you will order ahead of time.  Become familiar with serving sizes by practicing them at home using measuring cups and spoons. Once you learn to recognize portion sizes, you will be able to correctly estimate the amount of total carbohydrate you are allowed to eat at the restaurant. Ask for a takeout box if the  portion is more than you  should have. When your food comes, leave the amount you should have on the plate, and put the rest in the takeout box before you start eating.  Plan ahead if your mealtime will be different from usual. Check with your caregiver to find out how to time meals and medicine if you are taking insulin.  Avoid high-fat foods, such as fried foods, cream sauces, high-fat salad dressings, or any added butter or margarine.  Do not be afraid to ask questions. Ask your server about the portion size, cooking methods, ingredients and if items can be substituted. Restaurants do not list all available items on the menu. You can ask for your main entree to be prepared using skim milk, oil instead of butter or margarine, and without gravy or sauces. Ask your waiter or waitress to serve salad dressings, gravy, sauces, margarine, and sour cream on the side. You can then add the amount your meal plan suggests.  Add more vegetables whenever possible.  Avoid items that are labeled "jumbo," "giant," "deluxe," or "supersized."  You may want to split an entre with someone and order an extra side salad.  Watch for hidden calories in foods like croutons, bacon, or cheese.  Ask your server to take away the bread basket or chips from your table.  Order a dinner salad as an appetizer. You can eat most foods served in a restaurant. Some foods are better choices than others. Breads and Starches  Recommended: All kinds of bread (wheat, rye, white, oatmeal, [place], [place], raisin), hard or soft dinner rolls, frankfurter or hamburger buns, small bagels, small corn or whole-wheat flour tortillas.  Avoid: Frosted or glazed breads, butter rolls, egg or cheese breads, croissants, sweet rolls, pastries, coffee cake, glazed or frosted doughnuts, muffins. Crackers  Recommended: Animal crackers, graham, rye, saltine, oyster, and matzoth crackers. Bread sticks, melba toast, rusks, pretzels, popcorn (without  fat), zwieback toast.  Avoid: High-fat snack crackers or chips. Buttered popcorn. Cereals  Recommended: Hot and cold cereals. Whole grains such as oatmeal or shredded wheat are good choices.  Avoid: Sugar-coated or granola type cereals. Potatoes/Pasta/Rice/Beans  Recommended: Order baked, boiled, or mashed potatoes, rice or noodles without added fat, whole beans. Order gravies, butter, margarine, or sauces on the side so you can control the amount you add.  Avoid: Hash browns or fried potatoes. Potatoes, pasta, or rice prepared with cream or cheese sauce. Potato or pasta salads prepared with large amounts of dressing. Fried beans or fried rice. Vegetables  Recommended: Order steamed, baked, boiled, or stewed vegetables without sauces or extra fat. Ask that sauce be served on the side. If vegetables are not listed on the menu, ask what is available.  Avoid: Vegetables prepared with cream, butter, or cheese sauce. Fried vegetables. Salad Bars  Recommended: Many of the vegetables at a salad bar are considered "free." Use lemon juice, vinegar, or low-calorie salad dressing (fewer than 20 calories per serving) as "free" dressings for your salad. Look for salad bar ingredients that have no added fat or sugar such as tomatoes, lettuce, cucumbers, broccoli, carrots, onions, and mushrooms.  Avoid: Prepared salads with large amounts of dressing, such as coleslaw, caesar salad, macaroni salad, bean salad, or carrot salad. Fruit  Recommended: Eat fresh fruit or fresh fruit salad without added dressing. A salad bar often offers fresh fruit choices, but canned fruit at a restaurant is usually packed in sugar or syrup.  Avoid: Sweetened canned or frozen fruits, plain or sweetened fruit juice. Fruit salads with dressing,  sour cream, or sugar added to them. Meat and Meat Substitutes  Recommended: Order broiled, baked, roasted, or grilled meat, poultry, or fish. Trim off all visible fat. Do not eat the  skin of poultry. The size stated on the menu is the raw weight. Meat shrinks by  in cooking (for example, 4 oz raw equals 3 oz cooked meat).  Avoid: Deep-fat fried meat, poultry, or fish. Breaded meats. Eggs  Recommended: Order soft, hard-cooked, poached, or scrambled eggs. Omelets may be okay, depending on what ingredients are added. Egg substitutes are also a good choice.  Avoid: Fried eggs, eggs prepared with cream or cheese sauce. Milk  Recommended: Order low-fat or fat-free milk according to your meal plan. Plain, nonfat yogurt or flavored yogurt with no sugar added may be used as a substitute for milk. Soy milk may also be used.  Avoid: Milk shakes or sweetened milk beverages. Soups and Combination Foods  Recommended: Clear broth or consomm are "free" foods and may be used as an appetizer. Broth-based soups with fat removed count as a starch serving and are preferred over cream soups. Soups made with beans or split peas may be eaten but count as a starch.  Avoid: Fatty soups, soup made with cream, cheese soup. Combination foods prepared with excessive amounts of fat or with cream or cheese sauces. Desserts and Sweets  Recommended: Ask for fresh fruit. Sponge or angel food cake without icing, ice milk, no sugar added ice cream, sherbet, or frozen yogurt may fit into your meal plan occasionally.  Avoid: Pastries, puddings, pies, cakes with icing, custard, gelatin desserts. Fats and Oils  Recommended: Choose healthy fats such as olive oil, canola oil, or tub margarine, reduced fat or fat-free sour cream, cream cheese, avocado, or nuts.  Avoid: Any fats in excess of your allowed portion. Deep-fried foods or any food with a large amount of fat. Note: Ask for all fats to be served on the side, and limit your portion sizes according to your meal plan. Document Released: 10/17/2005 Document Revised: 01/09/2012 Document Reviewed: 05/07/2009 [facility] Patient Information 2014 [place],  [place]. Diabetes and Small Vessel Disease Small vessel disease (microvascular disease) includes nephropathy, retinopathy, and neuropathy. People with diabetes are at risk for these problems, but keeping blood glucose (sugar) controlled is helpful in preventing problems. DIABETIC KIDNEY PROBLEMS (DIABETIC NEPHROPATHY)  Diabetic nephropathy occurs in many patients with diabetes.  Damage to the small vessels in the kidneys is the leading cause of end-stage renal disease (ESRD).  Protein in the urine (albuminuria) in the range of 30 to 300 mg/24 h (microalbuminuria) is a sign of the earliest stage of diabetic nephropathy.  Good blood glucose (sugar) and blood pressure control significantly reduce the progression of nephropathy. DIABETIC EYE PROBLEMS (DIABETIC RETINOPATHY)  Diabetic retinopathy is the most common cause of new cases of blindness in adults. It is related to the number of years you have had diabetes.  Common risk factors include high blood sugar (hyperglycemia), high blood pressure (hypertension), and poorly controlled blood lipids such as high blood cholesterol (hypercholesterolemia). DIABETIC NERVE PROBLEMS (DIABETIC NEUROPATHY) Diabetic neuropathy is the most common, long-term complication of diabetes. It is responsible for more than half of leg amputations not due to accidents. The main risk for developing diabetic neuropathy seems to be uncontrolled blood sugars. Hyperglycemia damages the nerve fibers causing sensation (feeling) problems. The closer you can keep the following guidelines, the better chance you will have avoiding problems from small vessel disease.  Working toward near  normal blood glucose or as normal as possible. You will need to keep your blood glucose and A1c at the target range prescribed by your caregiver.  Keep your blood pressure less than 120/80.  Keep your low-density lipoprotein (LDL) cholesterol (one of the fats in your blood) at less than 100 mg/dL. An  LDL less than 70 mg/dL may be recommended for high risk patients. You cannot change your family history, but it is important to change the risk factors that you can. Risk factors you can control include:  Controlling high blood pressure.  Stopping smoking.  Using alcohol only in moderation. Generally, this means about one drink per day for women and two drinks per day for men.  Controlling your blood lipids (cholesterol and triglycerides).  Treating heart problems, if these are contributing to risk. SEEK MEDICAL CARE IF:   You are having problems keeping your blood glucose in goal range.  You notice a change in your vision or new problems with your vision.  You have wound or sore that does not heal.  Your blood pressure is above the target range. Document Released: 10/20/2003 Document Revised: 10/03/2012 Document Reviewed: 03/27/2009 [facility] Patient Information 2014 [place], [place]. Diabetes and Standards of Medical Care  Diabetes is complicated. You may find that your diabetes team includes a dietitian, nurse, diabetes educator, eye doctor, and more. To help everyone know what is going on and to help you get the care you deserve, the following schedule of care was developed to help keep you on track. Below are the tests, exams, vaccines, medicines, education, and plans you will need. A1c test  Performed at least 2 times a year if you are meeting treatment goals.  Performed 4 times a year if therapy has changed or if you are not meeting treatment goals. Blood pressure test  Performed at every routine medical visit. The goal is less than 120/80 mmHg. Dental exam  Follow up with the dentist regularly. Eye exam  Diagnosed with type 1 diabetes as a child: Get an exam upon reaching the age of 10 years or older and having had diabetes for 3 5 years. Yearly eye exams are recommended after that initial eye exam.  Diagnosed with type 1 diabetes as an adult: Get an exam within 5 years  of diagnosis and then yearly.  Diagnosed with type 2 diabetes: Get an exam as soon as possible after the diagnosis and then yearly. Foot care exam  Visual foot exams are performed at every routine medical visit. The exams check for cuts, injuries, or other problems with the feet.  A comprehensive foot exam should be done yearly. This includes visual inspection as well as assessing foot pulses and testing for loss of sensation. Kidney function test (urine microalbumin)  Performed once a year.  Type 1 diabetes: The first test is performed 5 years after diagnosis.  Type 2 diabetes: The first test is performed at the time of diagnosis.  A serum creatinine and estimated glomerular filtration rate (eGFR) test is done once a year to tell the level of chronic kidney disease (CKD), if present. Lipid profile (Cholesterol, HDL, LDL, Triglycerides)  Performed every 5 years for most people.  The goal for LDL is less than 100 mg/dl. If at high risk, the goal is less than 70 mg/dl.  The goal for HDL is 40 mg/dl 50 mg/dl for men and 50 mg/dl 60 mg/dl for women. An HDL cholesterol of 60 mg/dL or higher gives some protection against heart disease.  The goal for triglycerides is less than 150 mg/dl. Influenza vaccine, pneumococcal vaccine, and hepatitis B vaccine  The influenza vaccine is recommended yearly.  The pneumococcal vaccine is generally given once in a lifetime. However, there are some instances when another vaccination is recommended. Check with your caregiver.  The hepatitis B vaccine is also recommended for adults with diabetes. Diabetes self-management education  Recommended at diagnosis and ongoing as needed. Treatment plan  Reviewed at every medical visit. Document Released: 08/14/2009 Document Revised: 10/03/2012 Document Reviewed: 04/19/2011 [facility] Patient Information 2014 [place], [place].

## 2013-05-18 NOTE — Progress Notes (Signed)
  Subjective:    Patient ID: Suzanne Watkins, female    DOB: 03/10/1951, 62 y.o.   MRN: [id number] Chief Complaint  Patient presents with  . Medication Refill    HPI  Has transferred care to [facility] now and here for refill on her DM medicine.  Has been off of Janumet for about 3 wks Last a1c was 6 10/13 down from 10. Went to dm counseling which really helped her control her cbgs. Fasting cbgs 134-154 and then during the day almost always >150. this a.m. was 174 but has been only taking glucophage 1000 bid.  Is under a lot of stress this week which is why BP was higher.  Cardiology refills all of her lipitor, toprol, brilinta, valsartan. She has had a MI and has 2 stents placed 07/2009.  Sees Dr. Abigail Miyamoto yearly now every Sept/Oct.  Saw optho 2 wks ago and nml exam, no retinopathy.  Not fasting.  Past Medical History  Diagnosis Date  . Hypertension   . Hyperlipidemia   . GERD (gastroesophageal reflux disease)   . Obesity   . Fibromyalgia   . Myocardial infarction 2010    Minor (per pt)  . Diabetes mellitus   . Heart attack 2010    pt states that it was a minor heart attack, w/ no damage   Current Outpatient Prescriptions on File Prior to Visit  Medication Sig Dispense Refill  . aspirin 81 MG tablet Take 81 mg by mouth daily.      Marland Kitchen atorvastatin (LIPITOR) 80 MG tablet Take 80 mg by mouth daily.      . metoprolol succinate (TOPROL-XL) 25 MG 24 hr tablet Take 25 mg by mouth daily.      . Multiple Vitamin (MULTIVITAMIN) capsule Take 1 capsule by mouth daily.      Marland Kitchen omeprazole (PRILOSEC) 20 MG capsule Take 20 mg by mouth daily.      . Ticagrelor (BRILINTA) 90 MG TABS tablet Take 90 mg by mouth 2 (two) times daily.      . valsartan (DIOVAN) 80 MG tablet Take 80 mg by mouth daily.      . fluconazole (DIFLUCAN) 150 MG tablet Take 1 tablet (150 mg total) by mouth once. Repeat if needed  2 tablet  0  . sulfamethoxazole-trimethoprim (BACTRIM DS,SEPTRA DS) 800-160 MG per tablet Take 1 tablet  by mouth 2 (two) times daily.  20 tablet  0   No current facility-administered medications on file prior to visit.   No Known Allergies  Review of Systems    BP 150/74  Pulse 80  Temp(Src) 97.3 F (36.3 C) (Oral)  Resp 16  Ht 5\' 3"  (1.6 m)  Wt 188 lb (85.276 kg)  BMI 33.31 kg/m2  SpO2 97% Objective:   Physical Exam        Assessment & Plan:  Type II or unspecified type diabetes mellitus without mention of complication, uncontrolled - Plan: Microalbumin/Creatinine Ratio, Urine  Encounter for long-term (current) use of other medications  Unspecified essential hypertension - Plan: Microalbumin/Creatinine Ratio, Urine  Other and unspecified hyperlipidemia  Meds ordered this encounter  Medications  . sitaGLIPtan-metformin (JANUMET) 50-1000 MG per tablet    Sig: Take 1 tablet by mouth 2 (two) times daily with a meal.    Dispense:  180 tablet    Refill:  1

## 2013-08-06 ENCOUNTER — Other Ambulatory Visit: Payer: Self-pay | Admitting: Interventional Cardiology

## 2013-08-28 ENCOUNTER — Encounter (HOSPITAL_COMMUNITY): Payer: Self-pay | Admitting: Emergency Medicine

## 2013-08-28 ENCOUNTER — Emergency Department (HOSPITAL_COMMUNITY)
Admission: EM | Admit: 2013-08-28 | Discharge: 2013-08-28 | Disposition: A | Discharge status: Home / Self Care | Payer: BC Managed Care – PPO | Attending: Emergency Medicine | Admitting: Emergency Medicine

## 2013-08-28 ENCOUNTER — Emergency Department (HOSPITAL_COMMUNITY): Payer: BC Managed Care – PPO

## 2013-08-28 DIAGNOSIS — Z9861 Coronary angioplasty status: Secondary | ICD-10-CM | POA: Insufficient documentation

## 2013-08-28 DIAGNOSIS — K219 Gastro-esophageal reflux disease without esophagitis: Secondary | ICD-10-CM | POA: Insufficient documentation

## 2013-08-28 DIAGNOSIS — R072 Precordial pain: Secondary | ICD-10-CM | POA: Insufficient documentation

## 2013-08-28 DIAGNOSIS — E669 Obesity, unspecified: Secondary | ICD-10-CM | POA: Insufficient documentation

## 2013-08-28 DIAGNOSIS — E785 Hyperlipidemia, unspecified: Secondary | ICD-10-CM | POA: Insufficient documentation

## 2013-08-28 DIAGNOSIS — R0602 Shortness of breath: Secondary | ICD-10-CM | POA: Insufficient documentation

## 2013-08-28 DIAGNOSIS — I10 Essential (primary) hypertension: Secondary | ICD-10-CM | POA: Insufficient documentation

## 2013-08-28 DIAGNOSIS — I252 Old myocardial infarction: Secondary | ICD-10-CM | POA: Insufficient documentation

## 2013-08-28 DIAGNOSIS — E119 Type 2 diabetes mellitus without complications: Secondary | ICD-10-CM | POA: Insufficient documentation

## 2013-08-28 DIAGNOSIS — Z79899 Other long term (current) drug therapy: Secondary | ICD-10-CM | POA: Insufficient documentation

## 2013-08-28 DIAGNOSIS — R079 Chest pain, unspecified: Secondary | ICD-10-CM

## 2013-08-28 DIAGNOSIS — Z8739 Personal history of other diseases of the musculoskeletal system and connective tissue: Secondary | ICD-10-CM | POA: Insufficient documentation

## 2013-08-28 DIAGNOSIS — Z87891 Personal history of nicotine dependence: Secondary | ICD-10-CM | POA: Insufficient documentation

## 2013-08-28 DIAGNOSIS — R5381 Other malaise: Secondary | ICD-10-CM | POA: Insufficient documentation

## 2013-08-28 LAB — POCT I-STAT TROPONIN I: Troponin i, poc: 0.01 ng/mL (ref 0.00–0.08)

## 2013-08-28 LAB — CBC
HCT: 36.1 % (ref 36.0–46.0)
Platelets: 229 10*3/uL (ref 150–400)
RDW: 13.2 % (ref 11.5–15.5)
WBC: 8.8 10*3/uL (ref 4.0–10.5)

## 2013-08-28 LAB — BASIC METABOLIC PANEL
Calcium: 9.9 mg/dL (ref 8.4–10.5)
Chloride: 100 mEq/L (ref 96–112)
GFR calc Af Amer: >90 mL/min (ref >90)
Potassium: 4.1 mEq/L (ref 3.5–5.1)

## 2013-08-28 LAB — TROPONIN I: Troponin I: <0.3 ng/mL (ref <0.30)

## 2013-08-28 LAB — PRO B NATRIURETIC PEPTIDE: Pro B Natriuretic peptide (BNP): 60.5 pg/mL (ref 0–125)

## 2013-08-28 MED ORDER — NITROGLYCERIN 0.4 MG SL SUBL: 0.4000 mg | SUBLINGUAL_TABLET | SUBLINGUAL | Status: DC | PRN

## 2013-08-28 MED ORDER — ASPIRIN 325 MG PO TABS
325.0000 mg | ORAL_TABLET | ORAL | Status: AC
  Administered 2013-08-28: 325 mg via ORAL
  Filled 2013-08-28: qty 1

## 2013-08-28 NOTE — ED Notes (Signed)
Presents with chest pain that began at midnight tonight pain is sternal chest pressure with radiation into shoulder associated with SOB, dizziness, denies nausea. Nitro made pain better, sitting up makes pain better. Lying flat makes pain worse. Pain rated 3/10 with 2 nitros

## 2013-08-28 NOTE — ED Provider Notes (Addendum)
CSN: [id number]     Arrival date & time 08/28/13  0023 History   First MD Initiated Contact with Patient 08/28/13 0112     Chief Complaint  Patient presents with  . Chest Pain    HPI Is reports intermittent chest discomfort today it was transient but then worsened this evening.  She reports pain is substernal in nature and radiates into her left shoulder with associated shortness of breath.  She denies nausea and vomiting.  She has a history of cardiac disease.  Her last intervention was in 2010.  She tried nitroglycerin at home with some improvement in her symptoms but still has some ongoing chest discomfort at this time.  She states it feels like an aching tightness.  She does report that her pain seems to worsen with lying flat and improved with sitting up.  She does have some associated shortness of breath.  She reports generalized fatigue for most of the day.  She states this feels like her prior cardiac episode that required intervention.  She's given an aspirin on arrival to the emergency department.   Past Medical History  Diagnosis Date  . Hypertension   . Hyperlipidemia   . GERD (gastroesophageal reflux disease)   . Obesity   . Fibromyalgia   . Myocardial infarction 2010    Minor (per pt)  . Diabetes mellitus   . Heart attack 2010    pt states that it was a minor heart attack, w/ no damage   Past Surgical History  Procedure Laterality Date  . Cervical cone biopsy  03/1985  . Tubal ligation  1979  . Carpal tunnel release  07/1992    Right wrist  . Rotator cuff repair  1/06    Right shoulder  . Coronary angioplasty with stent placement  07/2009, 08/2009   Family History  Problem Relation Age of Onset  . Heart disease Mother   . Hyperlipidemia Mother   . Cancer Father     Throat  . Hyperlipidemia Brother   . Hypertension Brother   . Drug abuse Brother   . Diabetes Daughter   . Drug abuse Daughter   . Heart disease Daughter   . Heart disease Paternal Grandmother    . Stroke Paternal Grandfather   . Lupus Daughter    History  Substance Use Topics  . Smoking status: Former Games developer  . Smokeless tobacco: Never Used  . Alcohol Use: No   OB History   Grav Para Term Preterm Abortions TAB SAB Ect Mult Living                 Review of Systems  All other systems reviewed and are negative.    Allergies  Review of patient's allergies indicates no known allergies.  Home Medications   Current Outpatient Rx  Name  Route  Sig  Dispense  Refill  . atorvastatin (LIPITOR) 80 MG tablet      TAKE 1 TABLET BY MOUTH DAILY   30 tablet   0   . metoprolol succinate (TOPROL-XL) 25 MG 24 hr tablet   Oral   Take 25 mg by mouth daily.         . Multiple Vitamin (MULTIVITAMIN) capsule   Oral   Take 1 capsule by mouth daily.         Marland Kitchen omeprazole (PRILOSEC) 20 MG capsule   Oral   Take 20 mg by mouth daily.         . sitaGLIPtan-metformin (JANUMET) 50-1000 MG  per tablet   Oral   Take 1 tablet by mouth 2 (two) times daily with a meal.   180 tablet   1   . valsartan (DIOVAN) 80 MG tablet      TAKE 1 TABLET BY MOUTH DAILY   30 tablet   0    BP 143/71  Pulse 86  Temp(Src) 98.8 F (37.1 C) (Oral)  Resp 21  SpO2 96% Physical Exam  Nursing note and vitals reviewed. Constitutional: She is oriented to person, place, and time. She appears well-developed and well-nourished. No distress.  HENT:  Head: Normocephalic and atraumatic.  Eyes: EOM are normal.  Neck: Normal range of motion.  Cardiovascular: Normal rate, regular rhythm and normal heart sounds.   Pulmonary/Chest: Effort normal and breath sounds normal.  Abdominal: Soft. She exhibits no distension. There is no tenderness.  Musculoskeletal: Normal range of motion.  Neurological: She is alert and oriented to person, place, and time.  Skin: Skin is warm and dry.  Psychiatric: She has a normal mood and affect. Judgment normal.    ED Course  Procedures (including critical care  time) Labs Review Labs Reviewed  BASIC METABOLIC PANEL - Abnormal; Notable for the following:    Glucose, Bld 199 (*)    GFR calc non Af Amer 89 (*)    All other components within normal limits  CBC  PRO B NATRIURETIC PEPTIDE  POCT I-STAT TROPONIN I   Imaging Review No results found.  EKG Interpretation     Ventricular Rate:  89 PR Interval:  138 QRS Duration: 72 QT Interval:  374 QTC Calculation: 455 R Axis:   46 Text Interpretation:  Normal sinus rhythm Normal ECG No significant change was found            MDM   1. Chest pain    3:07 AM The patient feels better at this time.  Her symptoms are concerning to her and feel like her prior NSTEMI that resulted in PCI in 2010.  I think it's worthwhile the patient be seen and evaluated by the cardiologist I think she'll benefit from admission and serial enzymes through the night with possible provocative testing tomorrow.  I will ask cardiology to evaluate.    Lyanne Co, MD 08/28/13 0308  Lyanne Co, MD 08/28/13 [phone]  5:23 AM Patient continues to be chest pain-free at this time.  Repeat troponin is normal.  The patient was seen and evaluated by Dr. Adolm Joseph of cardiology.  He stated that the patient's next troponin I was normal he felt comfortable that this was not a presentation of acute coronary syndrome and that the patient can be discharged home safely with outpatient followup.  Please see consultation note for complete details  Lyanne Co, MD 08/28/13 [phone]

## 2013-08-28 NOTE — Consult Note (Signed)
CARDIOLOGY CONSULT NOTE  Patient ID: Suzanne Watkins, MRN: [id number], DOB/AGE: [birth year] 60 y.o. Admit date: 08/28/2013 Date of Consult: 08/28/2013  Primary Physician: Carmelina Dane, MD Primary Cardiologist: Dr. Eldridge Dace  Chief Complaint: chest pain Reason for Consultation: cardiac cause of chest pain?  HPI: 62 y.o. female w/ PMHx significant for CAD s/p stents 2010, HTN, fibromyalgia who presented to [facility] on 08/28/2013 with complaints of chest discomfort.  She recently saw Dr. Eldridge Dace a month ago at which time she was doing well. She stopped brilinta at that time. Since then, she has been doing well until this evening when she noted intermittent chest pain when she tried to lay down to go to bed. Substernal chest pain seem to get worse when she would lay down. Would improve with sitting up. Also noted getting short of breath. She began to get quite anxious about the symptoms and began monitoring her blood pressure which steadily rose resulting in more anxiety. She denies any excessive exertion or injury to her chest. She does not that her chest wall feels tender now.  She states that her symptoms now do have some similarities to 4 years ago but not completely. Her fibromylgia manifests as fatigue and multipoint tenderness- different that current symptoms.   At home she took a nitro and then came immediately to the ER (lives [place] the road) and the pain had essentially resolved by then.   She reports mild tenderness to her chest currently. She has been up to the rest room and ambulating without chest pain. She reports that she has not been wearing a bra on vacation this week and perhaps that it contributing to her chest discomfort.  Past Medical History  Diagnosis Date  . Hypertension   . Hyperlipidemia   . GERD (gastroesophageal reflux disease)   . Obesity   . Fibromyalgia   . Myocardial infarction 2010    Minor (per pt)  . Diabetes mellitus   . Heart attack  2010    pt states that it was a minor heart attack, w/ no damage      Surgical History:  Past Surgical History  Procedure Laterality Date  . Cervical cone biopsy  03/1985  . Tubal ligation  1979  . Carpal tunnel release  07/1992    Right wrist  . Rotator cuff repair  1/06    Right shoulder  . Coronary angioplasty with stent placement  07/2009, 08/2009     Home Meds: Prior to Admission medications   Medication Sig Start Date End Date Taking? Authorizing Provider  aspirin 325 MG tablet Take 325 mg by mouth daily.   Yes Historical Provider, MD  atorvastatin (LIPITOR) 80 MG tablet Take 80 mg by mouth daily.   Yes Historical Provider, MD  metoprolol succinate (TOPROL-XL) 25 MG 24 hr tablet Take 25 mg by mouth daily.   Yes Historical Provider, MD  Multiple Vitamin (MULTIVITAMIN WITH MINERALS) TABS tablet Take 1 tablet by mouth daily.   Yes Historical Provider, MD  omeprazole (PRILOSEC) 20 MG capsule Take 20 mg by mouth daily.   Yes Historical Provider, MD  sitaGLIPtan-metformin (JANUMET) 50-1000 MG per tablet Take 1 tablet by mouth 2 (two) times daily with a meal. 05/18/13  Yes Sherren Mocha, MD  valsartan (DIOVAN) 80 MG tablet Take 80 mg by mouth daily.   Yes Historical Provider, MD    Inpatient Medications:      Allergies: No Known Allergies  History   Social History  .  Marital Status: Married    Spouse Name: N/A    Number of Children: N/A  . Years of Education: N/A   Occupational History  . Not on file.   Social History Main Topics  . Smoking status: Former Games developer  . Smokeless tobacco: Never Used  . Alcohol Use: No  . Drug Use: Not on file  . Sexual Activity: Not on file   Other Topics Concern  . Not on file   Social History Narrative  . No narrative on file     Family History  Problem Relation Age of Onset  . Heart disease Mother   . Hyperlipidemia Mother   . Cancer Father     Throat  . Hyperlipidemia Brother   . Hypertension Brother   . Drug abuse Brother    . Diabetes Daughter   . Drug abuse Daughter   . Heart disease Daughter   . Heart disease Paternal Grandmother   . Stroke Paternal Grandfather   . Lupus Daughter      Review of Systems: General: negative for chills, fever, night sweats or weight changes.  Cardiovascular: see HPI Dermatological: negative for rash Respiratory: negative for cough or wheezing Urologic: negative for hematuria Abdominal: negative for nausea, vomiting, diarrhea, bright red blood per rectum, melena, or hematemesis Neurologic: negative for visual changes, syncope, or dizziness All other systems reviewed and are otherwise negative except as noted above.  Labs:  Recent Labs  08/28/13 0325  TROPONINI <0.30   Lab Results  Component Value Date   WBC 8.8 08/28/2013   HGB 12.9 08/28/2013   HCT 36.1 08/28/2013   MCV 89.1 08/28/2013   PLT 229 08/28/2013    Recent Labs Lab 08/28/13 0046  NA 138  K 4.1  CL 100  CO2 22  BUN 16  CREATININE 0.77  CALCIUM 9.9  GLUCOSE 199*   Lab Results  Component Value Date   CHOL  Value: 245        ATP III CLASSIFICATION:  <200     mg/dL   Desirable  161-096  mg/dL   Borderline High  >=045    mg/dL   High       * 40/07/8118   HDL 40 08/07/2009   LDLCALC  Value: 161        Total Cholesterol/HDL:CHD Risk Coronary Heart Disease Risk Table                     Men   Women  1/2 Average Risk   3.4   3.3  Average Risk       5.0   4.4  2 X Average Risk   9.6   7.1  3 X Average Risk  23.4   11.0        Use the calculated Patient Ratio above and the CHD Risk Table to determine the patient's CHD Risk.        ATP III CLASSIFICATION (LDL):  <100     mg/dL   Optimal  147-829  mg/dL   Near or Above                    Optimal  130-159  mg/dL   Borderline  562-130  mg/dL   High  >865     mg/dL   Very High* 78/01/6961   TRIG 222* 08/07/2009   No results found for this basename: DDIMER    Radiology/Studies:  Dg Chest Portable 1 View  08/28/2013   CLINICAL  DATA:  Chest pain, shortness of  Breath.  EXAM: PORTABLE CHEST - 1 VIEW  COMPARISON:  09/11/2009  FINDINGS: The heart size and mediastinal contours are within normal limits. Both lungs are clear. The visualized skeletal structures are unremarkable. No effusion. Orthopedic anchors in the right humeral head.  IMPRESSION: No acute cardiopulmonary disease.   Electronically Signed   By: Oley Balm M.D.   On: 08/28/2013 01:43    EKG: sinus, completely normal  Physical Exam: Blood pressure 130/89, pulse 81, temperature 98.8 F (37.1 C), temperature source Oral, resp. rate 16, SpO2 98.00%. General: Well developed, well nourished, in no acute distress. Head: Normocephalic, atraumatic, sclera non-icteric, no xanthomas, nares are without discharge.  Neck: Supple. Negative for carotid bruits. JVD not elevated. Lungs: Clear bilaterally to auscultation without wheezes, rales, or rhonchi. Breathing is unlabored. Heart: RRR with S1 S2. No murmurs, rubs, or gallops appreciated. Mild chest wall tenderness across the entire precordium. Abdomen: Soft, non-tender, non-distended with normoactive bowel sounds. No hepatomegaly. No rebound/guarding. No obvious abdominal masses. Msk:  Strength and tone appear normal for age. Extremities: No clubbing or cyanosis. No edema.  Distal pedal pulses are 2+ and equal bilaterally. Neuro: Alert and oriented X 3. Moves all extremities spontaneously. Psych:  Responds to questions appropriately with a normal affect.   Problem List 1. Chest pain, atypical 2. CAD with history of stents 3. HTN 4. Assessment and Plan:  62 y.o. female w/ PMHx significant for CAD s/p stents 2010, HTN, fibromyalgia who presented to [facility] on 08/28/2013 with complaints of chest discomfort.  Encouragingly, she is chest pain free currently and her initial biomarkers are negative and her EKG at presentation was completely normal. Also arguing against this being cardiac is the atypical aspect of chest wall tenderness and  the positional aspect of the pain (no evidence of pericarditis or CHF which could also have "PND" like symptoms).  Given such a low suspicion for a cardiac cause of her symptoms, I think a repeat troponin now should suffice and if negative, she discharge home. She lives very close to the hospital and both her and her husband are in agreement that is a reasonable plan. She is somewhat bothered by not having a clear alternative diagnosis but relieved that it is unlikely cardiac. MSK causes are at the top of my differential and can be treated with hot/cold packs and/or Tylenol prn.  Summary of recs: -repeat troponin now, if negative, can follow up prn with Dr. Eldridge Dace  Thank you for this consult. Please call with questions.  Signed, Adolm Joseph, Sharaya Boruff C. MD 08/28/2013, 4:23 AM

## 2013-08-29 ENCOUNTER — Other Ambulatory Visit: Payer: Self-pay | Admitting: Interventional Cardiology

## 2013-08-30 ENCOUNTER — Other Ambulatory Visit: Payer: Self-pay | Admitting: Interventional Cardiology

## 2013-08-30 DIAGNOSIS — E782 Mixed hyperlipidemia: Secondary | ICD-10-CM

## 2013-08-30 NOTE — Telephone Encounter (Signed)
Pt is due for fasting lipids.

## 2013-09-04 ENCOUNTER — Other Ambulatory Visit (INDEPENDENT_AMBULATORY_CARE_PROVIDER_SITE_OTHER): Payer: BC Managed Care – PPO

## 2013-09-04 ENCOUNTER — Inpatient Hospital Stay (HOSPITAL_COMMUNITY)
Admission: EM | Admit: 2013-09-04 | Discharge: 2013-09-05 | DRG: 247 | Disposition: A | Discharge status: Home / Self Care | Payer: BC Managed Care – PPO | Attending: Interventional Cardiology | Admitting: Interventional Cardiology

## 2013-09-04 ENCOUNTER — Encounter (HOSPITAL_COMMUNITY)
Admission: EM | Disposition: A | Discharge status: Home / Self Care | Payer: Self-pay | Source: Home / Self Care | Attending: Cardiology

## 2013-09-04 ENCOUNTER — Emergency Department (HOSPITAL_COMMUNITY): Payer: BC Managed Care – PPO

## 2013-09-04 ENCOUNTER — Encounter (HOSPITAL_COMMUNITY): Payer: Self-pay | Admitting: Emergency Medicine

## 2013-09-04 DIAGNOSIS — I2510 Atherosclerotic heart disease of native coronary artery without angina pectoris: Principal | ICD-10-CM

## 2013-09-04 DIAGNOSIS — I252 Old myocardial infarction: Secondary | ICD-10-CM

## 2013-09-04 DIAGNOSIS — E782 Mixed hyperlipidemia: Secondary | ICD-10-CM

## 2013-09-04 DIAGNOSIS — E669 Obesity, unspecified: Secondary | ICD-10-CM

## 2013-09-04 DIAGNOSIS — K219 Gastro-esophageal reflux disease without esophagitis: Secondary | ICD-10-CM

## 2013-09-04 DIAGNOSIS — IMO0001 Reserved for inherently not codable concepts without codable children: Secondary | ICD-10-CM

## 2013-09-04 DIAGNOSIS — E785 Hyperlipidemia, unspecified: Secondary | ICD-10-CM

## 2013-09-04 DIAGNOSIS — E1169 Type 2 diabetes mellitus with other specified complication: Secondary | ICD-10-CM | POA: Diagnosis present

## 2013-09-04 DIAGNOSIS — M797 Fibromyalgia: Secondary | ICD-10-CM

## 2013-09-04 DIAGNOSIS — I200 Unstable angina: Secondary | ICD-10-CM

## 2013-09-04 DIAGNOSIS — E119 Type 2 diabetes mellitus without complications: Secondary | ICD-10-CM | POA: Diagnosis present

## 2013-09-04 DIAGNOSIS — I10 Essential (primary) hypertension: Secondary | ICD-10-CM

## 2013-09-04 DIAGNOSIS — Z87891 Personal history of nicotine dependence: Secondary | ICD-10-CM

## 2013-09-04 HISTORY — DX: Atherosclerotic heart disease of native coronary artery without angina pectoris: I25.10

## 2013-09-04 HISTORY — DX: Type 2 diabetes mellitus without complications: E11.9

## 2013-09-04 HISTORY — DX: Unstable angina: I20.0

## 2013-09-04 HISTORY — DX: Malignant neoplasm of cervix uteri, unspecified: C53.9

## 2013-09-04 HISTORY — PX: LEFT HEART CATHETERIZATION WITH CORONARY ANGIOGRAM: SHX5451

## 2013-09-04 HISTORY — DX: Unspecified osteoarthritis, unspecified site: M19.90

## 2013-09-04 HISTORY — DX: Basal cell carcinoma of skin of nose: C44.311

## 2013-09-04 HISTORY — DX: Shortness of breath: R06.02

## 2013-09-04 LAB — BASIC METABOLIC PANEL
BUN: 19 mg/dL (ref 6–23)
Calcium: 10.2 mg/dL (ref 8.4–10.5)
Chloride: 98 mEq/L (ref 96–112)
Creatinine, Ser: 0.64 mg/dL (ref 0.50–1.10)
GFR calc Af Amer: >90 mL/min (ref >90)

## 2013-09-04 LAB — COMPREHENSIVE METABOLIC PANEL
ALT: 21 U/L (ref 0–35)
AST: 23 U/L (ref 0–37)
Albumin: 3.9 g/dL (ref 3.5–5.2)
Alkaline Phosphatase: 83 U/L (ref 39–117)
Chloride: 99 mEq/L (ref 96–112)
GFR calc Af Amer: >90 mL/min (ref >90)
GFR calc non Af Amer: >90 mL/min (ref >90)
Glucose, Bld: 125 mg/dL — ABNORMAL HIGH (ref 70–99)
Potassium: 4.3 mEq/L (ref 3.5–5.1)
Total Bilirubin: 0.5 mg/dL (ref 0.3–1.2)

## 2013-09-04 LAB — GLUCOSE, CAPILLARY: Glucose-Capillary: 224 mg/dL — ABNORMAL HIGH (ref 70–99)

## 2013-09-04 LAB — CBC
HCT: 38 % (ref 36.0–46.0)
MCH: 31.7 pg (ref 26.0–34.0)
MCHC: 35.3 g/dL (ref 30.0–36.0)
MCV: 89.8 fL (ref 78.0–100.0)
RDW: 12.9 % (ref 11.5–15.5)
WBC: 9.8 10*3/uL (ref 4.0–10.5)

## 2013-09-04 LAB — TROPONIN I: Troponin I: <0.3 ng/mL (ref <0.30)

## 2013-09-04 LAB — LIPID PANEL
HDL: 35.6 mg/dL — ABNORMAL LOW (ref >39.00)
LDL Cholesterol: 84 mg/dL (ref 0–99)
Triglycerides: 170 mg/dL — ABNORMAL HIGH (ref 0.0–149.0)

## 2013-09-04 LAB — APTT: aPTT: 156 seconds — ABNORMAL HIGH (ref 24–37)

## 2013-09-04 LAB — PROTIME-INR: INR: 1.04 (ref 0.00–1.49)

## 2013-09-04 LAB — HEPATIC FUNCTION PANEL
Albumin: 4.2 g/dL (ref 3.5–5.2)
Total Bilirubin: 0.5 mg/dL (ref 0.3–1.2)
Total Protein: 7.3 g/dL (ref 6.0–8.3)

## 2013-09-04 LAB — POCT I-STAT TROPONIN I: Troponin i, poc: 0.08 ng/mL (ref 0.00–0.08)

## 2013-09-04 SURGERY — LEFT HEART CATHETERIZATION WITH CORONARY ANGIOGRAM
Anesthesia: LOCAL

## 2013-09-04 MED ORDER — SODIUM CHLORIDE 0.9 % IV SOLN: INTRAVENOUS | Status: DC

## 2013-09-04 MED ORDER — FENTANYL CITRATE 0.05 MG/ML IJ SOLN
INTRAMUSCULAR | Status: AC
  Filled 2013-09-04: qty 2

## 2013-09-04 MED ORDER — SODIUM CHLORIDE 0.9 % IJ SOLN: 3.0000 mL | Freq: Two times a day (BID) | INTRAMUSCULAR | Status: DC

## 2013-09-04 MED ORDER — HEPARIN (PORCINE) IN NACL 2-0.9 UNIT/ML-% IJ SOLN
INTRAMUSCULAR | Status: AC
  Filled 2013-09-04: qty 1000

## 2013-09-04 MED ORDER — METOPROLOL SUCCINATE ER 25 MG PO TB24
25.0000 mg | ORAL_TABLET | Freq: Every day | ORAL | Status: DC
  Filled 2013-09-04: qty 1

## 2013-09-04 MED ORDER — ASPIRIN EC 81 MG PO TBEC: 81.0000 mg | DELAYED_RELEASE_TABLET | Freq: Every day | ORAL | Status: DC

## 2013-09-04 MED ORDER — DIPHENHYDRAMINE HCL 50 MG PO CAPS
100.0000 mg | ORAL_CAPSULE | Freq: Every evening | ORAL | Status: DC | PRN
  Filled 2013-09-04: qty 2

## 2013-09-04 MED ORDER — ASPIRIN 81 MG PO CHEW
324.0000 mg | CHEWABLE_TABLET | Freq: Once | ORAL | Status: AC
  Administered 2013-09-04: 324 mg via ORAL
  Filled 2013-09-04: qty 4

## 2013-09-04 MED ORDER — LIDOCAINE HCL (PF) 1 % IJ SOLN
INTRAMUSCULAR | Status: AC
  Filled 2013-09-04: qty 30

## 2013-09-04 MED ORDER — HEPARIN SODIUM (PORCINE) 1000 UNIT/ML IJ SOLN
INTRAMUSCULAR | Status: AC
  Filled 2013-09-04: qty 1

## 2013-09-04 MED ORDER — ATORVASTATIN CALCIUM 80 MG PO TABS
80.0000 mg | ORAL_TABLET | Freq: Every day | ORAL | Status: DC
  Administered 2013-09-04: 22:00:00 80 mg via ORAL
  Filled 2013-09-04 (×2): qty 1

## 2013-09-04 MED ORDER — ONDANSETRON HCL 4 MG/2ML IJ SOLN: 4.0000 mg | Freq: Four times a day (QID) | INTRAMUSCULAR | Status: DC | PRN

## 2013-09-04 MED ORDER — TICAGRELOR 90 MG PO TABS
ORAL_TABLET | ORAL | Status: AC
  Administered 2013-09-04: 90 mg via ORAL
  Filled 2013-09-04: qty 1

## 2013-09-04 MED ORDER — LIVING WELL WITH DIABETES BOOK
Freq: Once | Status: AC
  Administered 2013-09-04: 20:00:00
  Filled 2013-09-04: qty 1

## 2013-09-04 MED ORDER — ACETAMINOPHEN 325 MG PO TABS: 650.0000 mg | ORAL_TABLET | ORAL | Status: DC | PRN

## 2013-09-04 MED ORDER — SODIUM CHLORIDE 0.9 % IV SOLN: 250.0000 mL | INTRAVENOUS | Status: DC | PRN

## 2013-09-04 MED ORDER — MIDAZOLAM HCL 2 MG/2ML IJ SOLN
INTRAMUSCULAR | Status: AC
  Filled 2013-09-04: qty 2

## 2013-09-04 MED ORDER — PANTOPRAZOLE SODIUM 40 MG PO TBEC: 40.0000 mg | DELAYED_RELEASE_TABLET | Freq: Every day | ORAL | Status: DC

## 2013-09-04 MED ORDER — ASPIRIN 81 MG PO CHEW: 81.0000 mg | CHEWABLE_TABLET | Freq: Every day | ORAL | Status: DC

## 2013-09-04 MED ORDER — ASPIRIN 81 MG PO CHEW: 81.0000 mg | CHEWABLE_TABLET | ORAL | Status: DC

## 2013-09-04 MED ORDER — ASPIRIN 300 MG RE SUPP: 300.0000 mg | RECTAL | Status: DC

## 2013-09-04 MED ORDER — SODIUM CHLORIDE 0.9 % IJ SOLN: 3.0000 mL | INTRAMUSCULAR | Status: DC | PRN

## 2013-09-04 MED ORDER — ASPIRIN 81 MG PO CHEW: 324.0000 mg | CHEWABLE_TABLET | ORAL | Status: DC

## 2013-09-04 MED ORDER — TICAGRELOR 90 MG PO TABS
90.0000 mg | ORAL_TABLET | Freq: Two times a day (BID) | ORAL | Status: DC
  Administered 2013-09-04: 90 mg via ORAL
  Filled 2013-09-04 (×4): qty 1

## 2013-09-04 MED ORDER — VERAPAMIL HCL 2.5 MG/ML IV SOLN
INTRAVENOUS | Status: AC
Start: 2013-09-04 — End: 2013-09-04
  Filled 2013-09-04: qty 2

## 2013-09-04 MED ORDER — NITROGLYCERIN 0.4 MG SL SUBL: 0.4000 mg | SUBLINGUAL_TABLET | SUBLINGUAL | Status: DC | PRN

## 2013-09-04 MED ORDER — IRBESARTAN 75 MG PO TABS
75.0000 mg | ORAL_TABLET | Freq: Every day | ORAL | Status: DC
  Filled 2013-09-04: qty 1

## 2013-09-04 MED ORDER — DIAZEPAM 5 MG PO TABS: 5.0000 mg | ORAL_TABLET | ORAL | Status: DC

## 2013-09-04 MED ORDER — NITROGLYCERIN 0.2 MG/ML ON CALL CATH LAB
INTRAVENOUS | Status: AC
  Filled 2013-09-04: qty 1

## 2013-09-04 MED ORDER — TICAGRELOR 90 MG PO TABS
ORAL_TABLET | ORAL | Status: AC
  Filled 2013-09-04: qty 1

## 2013-09-04 NOTE — ED Notes (Signed)
Pt here c/o left sided CP with SOB worse with exertion x 4 days; pt sts seen here last week for same

## 2013-09-04 NOTE — Progress Notes (Signed)
TR band removed @ 1835.  Radial and ulnar pulses +2, cap refill less than 3 seconds, site without signs of bleeding.  Occlusive dressing applied.  Pt tolerated procedure well.  Assistance with deflation and removal by Ollen Barges, RN. Ricka Westra, [facility]

## 2013-09-04 NOTE — ED Notes (Signed)
Pt brought back from radiology.

## 2013-09-04 NOTE — CV Procedure (Signed)
       PROCEDURE:  PCI LAD, PCI RCA  INDICATIONS:  Unstable angina  The risks, benefits, and details of the procedure were explained to the patient.  The patient verbalized understanding and wanted to proceed.  Informed written consent was obtained.  PROCEDURE TECHNIQUE:  Dr. Anne Fu performed a diagnostic catheterization which revealed a 90% proximal right coronary artery lesion and an 80% mid left anterior descending lesion. Right coronary angiography was done using a 5 French Judkins R4 guide catheter.  Left coronary angiography was done using a 5 [place] EBU 3.5 guide catheter.  Heparin was used for anticoagulation. An ACT was used to check that the heparin was therapeutic.   The LAD lesion was addressed first. A pro-water wire was placed across the lesion. We tried to direct stent of the stent would not cross the lesion. A 2.5 x 12 balloon was used to predilate. A 2.5 x 16 promise drug-eluting stent was then successfully deployed. The stent was post dilated with a 2.75 x 12 noncompliant. There is an excellent vein graft result.  The RCA lesion was then addressed. The same pro-water wire was used to cross the lesion. The same 2.5 x 12 balloon was used to predilate. A 2.75 x 16 promise drug-eluting stent was deployed. This was post dilated with a 3.0 x 12 noncompliant balloon.  There was an excellent angiographic result. A TR band was used for hemostasis.  Several doses of nitroglycerin were administered through the sheath due to patient discomfort and vasospasm   CONTRAST:  Total of 70 cc.  COMPLICATIONS:  None.       IMPRESSIONS:   1. Successful PCI of left anterior descending artery, midportion with a 2.5 x 16 mm promus drug-eluting stent..  2. Successful PCI of right coronary artery with a 2.75 x 16 promus drug-eluting stent.   RECOMMENDATION:  DAPT for at least a year with Brilinta and aspirin. She did not tolerate Plavix in the past 2 to an intolerance.  I recommended that she  increase her exercise.

## 2013-09-04 NOTE — H&P (Addendum)
Admit date: 09/04/2013 Primary Physician  Carmelina Dane, MD Primary Cardiologist  Dr. Eldridge Dace  CC: Chest pain  HPI: 62 year old female with known coronary artery disease status post DES to RCA and circumflex in 2010, diabetes, obesity, hypertension, hyperlipidemia with mother who died of myocardial infarction at age 37 who presents to the emergency room this morning after several days of worsening exertional chest discomfort concerning for unstable angina.  She works as a Comptroller at [employer]. and has noted over the past several days worsening shortness of breath as well as centralized chest discomfort progressing to a burning in her chest wall When wa from her place of work to the parking garage. She has been taking more and more nitroglycerin over the last several days. Prior to this, she has never needed nitroglycerin she states. He categorizes her symptoms as severe. Her anginal class is 3, markedly limited.  After original ER visit, plan was to perform nuclear stress test as an outpatient. She's had further escalation of symptoms since then.  She has been to the emergency department in late October as well as this morning.   Previously with her shortness of breath, Brilinta was discontinued as possible culprit. She still continues to have symptoms.    In regards to her diabetes, last hemoglobin A1c was 6 down from 10.      PMH:   Past Medical History  Diagnosis Date  . Hypertension   . Hyperlipidemia   . GERD (gastroesophageal reflux disease)   . Obesity   . Fibromyalgia   . Myocardial infarction 2010    Minor (per pt)  . Diabetes mellitus   . Heart attack 2010    pt states that it was a minor heart attack, w/ no damage  . Coronary atherosclerosis of native coronary artery 09/04/2013    RCA and obtuse marginal stent/DES-2010    PSH:   Past Surgical History  Procedure Laterality Date  . Cervical cone biopsy  03/1985  . Tubal ligation  1979  . Carpal tunnel  release  07/1992    Right wrist  . Rotator cuff repair  1/06    Right shoulder  . Coronary angioplasty with stent placement  07/2009, 08/2009   Allergies:  Plavix Prior to Admit Meds:   Prior to Admission medications   Medication Sig Start Date End Date Taking? Authorizing Provider  aspirin 325 MG tablet Take 325 mg by mouth at bedtime.    Yes Historical Provider, MD  atorvastatin (LIPITOR) 80 MG tablet Take 80 mg by mouth at bedtime.    Yes Historical Provider, MD  diphenhydrAMINE (BENADRYL) 50 MG capsule Take 100 mg by mouth at bedtime as needed for sleep.   Yes Historical Provider, MD  ibuprofen (ADVIL,MOTRIN) 200 MG tablet Take 400-600 mg by mouth daily as needed (pain).   Yes Historical Provider, MD  metoprolol succinate (TOPROL-XL) 25 MG 24 hr tablet Take 25 mg by mouth daily.   Yes Historical Provider, MD  Multiple Vitamin (MULTIVITAMIN WITH MINERALS) TABS tablet Take 1 tablet by mouth daily.   Yes Historical Provider, MD  omeprazole (PRILOSEC) 20 MG capsule Take 20 mg by mouth daily.   Yes Historical Provider, MD  sitaGLIPtan-metformin (JANUMET) 50-1000 MG per tablet Take 1 tablet by mouth 2 (two) times daily with a meal. 05/18/13  Yes Sherren Mocha, MD  valsartan (DIOVAN) 80 MG tablet Take 80 mg by mouth daily.   Yes Historical Provider, MD   Fam HX:  Family History  Problem Relation Age of Onset  . Heart disease Mother   . Hyperlipidemia Mother   . Cancer Father     Throat  . Hyperlipidemia Brother   . Hypertension Brother   . Drug abuse Brother   . Diabetes Daughter   . Drug abuse Daughter   . Heart disease Daughter   . Heart disease Paternal Grandmother   . Stroke Paternal Grandfather   . Lupus Daughter    Mother with myocardial infarction/death at age 30.   Social HX:    History   Social History  . Marital Status: Married    Spouse Name: N/A    Number of Children: N/A  . Years of Education: N/A   Occupational History  . Not on file.   Social History Main  Topics  . Smoking status: Former Games developer  . Smokeless tobacco: Never Used  . Alcohol Use: No  . Drug Use: Not on file  . Sexual Activity: Not on file   Other Topics Concern  . Not on file   Social History Narrative  . No narrative on file     ROS:  Nice any strokelike symptoms, no fevers, no chills, no syncope, no palpitations, no orthopnea, no rashes.  All 11 ROS were addressed and are negative except what is stated in the HPI   Physical Exam: Blood pressure 134/72, pulse 81, temperature 98.4 F (36.9 C), temperature source Oral, resp. rate 15, SpO2 100.00%.   General: Well developed, well nourished, in no acute distress Head: Eyes PERRLA, No xanthomas.   Normal cephalic and atramatic  Lungs:  Clear bilaterally to auscultation and percussion. Normal respiratory effort. No wheezes, no rales. Heart:  HRRR S1 S2 Pulses are 2+ & equal. No murmurs, rubs or gallops.             No carotid bruit. No JVD.  No abdominal bruits. Abdomen: Bowel sounds are positive, abdomen soft and non-tender without masses No hepatosplenomegaly. Obese Msk:  Back normal, normal gait. Normal strength and tone for age. Extremities:  No clubbing, cyanosis or edema.  DP +1, 2+ radial pulse  Neuro: Alert and oriented X 3, non-focal, MAE x 4 GU: Deferred Rectal: Deferred Psych:  Good affect, responds appropriately         Labs:   Lab Results  Component Value Date   WBC 9.8 09/04/2013   HGB 13.4 09/04/2013   HCT 38.0 09/04/2013   MCV 89.8 09/04/2013   PLT 219 09/04/2013    Recent Labs Lab 09/04/13 0838  NA 136  K 4.7  CL 98  CO2 24  BUN 19  CREATININE 0.64  CALCIUM 10.2  GLUCOSE 156*    Recent Labs  09/04/13 0830  TROPONINI <0.30   Lab Results  Component Value Date   CHOL  Value: 245        ATP III CLASSIFICATION:  <200     mg/dL   Desirable  657-846  mg/dL   Borderline High  >=962    mg/dL   High       * 95/12/8411   HDL 40 08/07/2009   LDLCALC  Value: 161        Total  Cholesterol/HDL:CHD Risk Coronary Heart Disease Risk Table                     Men   Women  1/2 Average Risk   3.4   3.3  Average Risk  5.0   4.4  2 X Average Risk   9.6   7.1  3 X Average Risk  23.4   11.0        Use the calculated Patient Ratio above and the CHD Risk Table to determine the patient's CHD Risk.        ATP III CLASSIFICATION (LDL):  <100     mg/dL   Optimal  161-096  mg/dL   Near or Above                    Optimal  130-159  mg/dL   Borderline  045-409  mg/dL   High  >811     mg/dL   Very High* 91/01/7828   TRIG 222* 08/07/2009   No results found for this basename: DDIMER     Radiology:  Dg Chest 2 View  09/04/2013   CLINICAL DATA:  Chest pain  EXAM: CHEST  2 VIEW  COMPARISON:  August 28, 2013  FINDINGS: Lungs are clear. Heart size and pulmonary vascularity are normal. No adenopathy. There is postoperative change in the right shoulder.  IMPRESSION: No edema or consolidation.   Electronically Signed   By: Bretta Bang M.D.   On: 09/04/2013 08:57   Personally viewed.   EKG:  Normal sinus rhythm with nonspecific ST changes.  Personally viewed.  Cardiac catheterization: 08/07/2009 as well as 09/23/2009: RCA and obtuse marginal DES. Obtuse marginal stent jailed native circumflex but at end of case, good flow. Mid 40% LAD stenosis-small caliber vessel. Normal EF  ASSESSMENT/PLAN:   62 year old female with known coronary artery disease, diabetes, hypertension, hyperlipidemia, obesity with strong early family history of CAD here with increasing anginal symptoms consistent with unstable angina.   1. Unstable angina-I discussed with her as well as Dr. Eldridge Dace and I feel based upon her symptoms of increasing nitroglycerin use, increasing exertional angina, CCS class III that she should proceed to the cardiac catheterization lab for further evaluation of her coronary anatomy. We will try radial artery approach. Discussed risks and benefits including stroke, heart attack, death,  bleeding. Based upon the severity and escalation of her symptoms, I do not feel that clinically a myocardial perfusion study would be prudent as first step. Thus far, troponin and EKG have been reassuring. I will hold off on giving full dose heparin at this point since she has potential for going to cardiac catheterization lab within the next hour or so. We will determine anticoagulation as well as dual antiplatelet therapy at that time.  2. Diabetes-insulin sliding scale. Hold metformin.  3. Obesity-encourage weight loss.  4. Hypertension-continue with antihypertensives. Monitor.  5. Hyperlipidemia-continue with statin therapy.  Donato Schultz, MD  09/04/2013  10:03 AM

## 2013-09-04 NOTE — ED Notes (Addendum)
Pt c/o intermittent CP the past 1.5 weeks, reports that she has been taking Nitro at home and gets relief, but if she gets up and moves around the CP will return and then becomes SOB. sts she called her cardiologist and they did bld work this morning and they suggested doing a stress test but hasn't been able to set that up yet, last stress test was in 2007. Last heart cath in nov. 2010. Family hx of early MI. Pt sts she would have waited to go there but this morning while she was trying to get up for the day she was just go SOB and thought it was best to come to the ED. Denies cough/fevers at home. Nad, skin warm and dry, resp e/u.

## 2013-09-04 NOTE — Interval H&P Note (Signed)
History and Physical Interval Note:  09/04/2013 12:14 PM  Suzanne Watkins  has presented today for surgery, with the diagnosis of cp  The various methods of treatment have been discussed with the patient and family. After consideration of risks, benefits and other options for treatment, the patient has consented to  Procedure(s): LEFT HEART CATHETERIZATION WITH CORONARY ANGIOGRAM (N/A) as a surgical intervention .  The patient's history has been reviewed, patient examined, no change in status, stable for surgery.  I have reviewed the patient's chart and labs.  Questions were answered to the patient's satisfaction.     Luke Falero

## 2013-09-04 NOTE — ED Provider Notes (Signed)
CSN: [id number]     Arrival date & time 09/04/13  0800 History   First MD Initiated Contact with Patient 09/04/13 0813     Chief Complaint  Patient presents with  . Chest Pain   (Consider location/radiation/quality/duration/timing/severity/associated sxs/prior Treatment) HPI Comments: 62 yo female with DM, CAD/ 2 stents, last stent 2010, past smoker presents with worsening cp sxs since last ED visit.  Exertional, lasting minutes to hour, improves with rest/ nitro, similar to previous stent placement.  No PE/ DVT, recent surgery/ travel, leg pain/ swelling hx.  Non radiating.  Past smoker.   Patient is a 62 y.o. female presenting with chest pain. The history is provided by the patient.  Chest Pain Associated symptoms: nausea   Associated symptoms: no abdominal pain, no back pain, no diaphoresis, no fever, no headache, no shortness of breath and not vomiting     Past Medical History  Diagnosis Date  . Hypertension   . Hyperlipidemia   . GERD (gastroesophageal reflux disease)   . Obesity   . Fibromyalgia   . Myocardial infarction 2010    Minor (per pt)  . Diabetes mellitus   . Heart attack 2010    pt states that it was a minor heart attack, w/ no damage   Past Surgical History  Procedure Laterality Date  . Cervical cone biopsy  03/1985  . Tubal ligation  1979  . Carpal tunnel release  07/1992    Right wrist  . Rotator cuff repair  1/06    Right shoulder  . Coronary angioplasty with stent placement  07/2009, 08/2009   Family History  Problem Relation Age of Onset  . Heart disease Mother   . Hyperlipidemia Mother   . Cancer Father     Throat  . Hyperlipidemia Brother   . Hypertension Brother   . Drug abuse Brother   . Diabetes Daughter   . Drug abuse Daughter   . Heart disease Daughter   . Heart disease Paternal Grandmother   . Stroke Paternal Grandfather   . Lupus Daughter    History  Substance Use Topics  . Smoking status: Former Games developer  . Smokeless tobacco: Never  Used  . Alcohol Use: No   OB History   Grav Para Term Preterm Abortions TAB SAB Ect Mult Living                 Review of Systems  Constitutional: Negative for fever, chills and diaphoresis.  HENT: Negative for congestion.   Eyes: Negative for visual disturbance.  Respiratory: Negative for shortness of breath.   Cardiovascular: Positive for chest pain.  Gastrointestinal: Positive for nausea. Negative for vomiting and abdominal pain.  Genitourinary: Negative for dysuria and flank pain.  Musculoskeletal: Negative for back pain, neck pain and neck stiffness.  Skin: Negative for rash.  Neurological: Negative for light-headedness and headaches.    Allergies  Review of patient's allergies indicates no known allergies.  Home Medications   Current Outpatient Rx  Name  Route  Sig  Dispense  Refill  . aspirin 325 MG tablet   Oral   Take 325 mg by mouth daily.         Marland Kitchen atorvastatin (LIPITOR) 80 MG tablet   Oral   Take 80 mg by mouth daily.         Marland Kitchen atorvastatin (LIPITOR) 80 MG tablet      TAKE 1 TABLET BY MOUTH EVERY DAY   30 tablet   1   . metoprolol  succinate (TOPROL-XL) 25 MG 24 hr tablet   Oral   Take 25 mg by mouth daily.         . Multiple Vitamin (MULTIVITAMIN WITH MINERALS) TABS tablet   Oral   Take 1 tablet by mouth daily.         Marland Kitchen omeprazole (PRILOSEC) 20 MG capsule   Oral   Take 20 mg by mouth daily.         . sitaGLIPtan-metformin (JANUMET) 50-1000 MG per tablet   Oral   Take 1 tablet by mouth 2 (two) times daily with a meal.   180 tablet   1   . valsartan (DIOVAN) 80 MG tablet   Oral   Take 80 mg by mouth daily.         . valsartan (DIOVAN) 80 MG tablet      TAKE 1 TABLET BY MOUTH DAILY   30 tablet   1    BP 134/72  Pulse 81  Temp(Src) 98.4 F (36.9 C) (Oral)  Resp 15  SpO2 100% Physical Exam  Nursing note and vitals reviewed. Constitutional: She is oriented to person, place, and time. She appears well-developed and  well-nourished.  HENT:  Head: Normocephalic and atraumatic.  Eyes: Conjunctivae are normal. Right eye exhibits no discharge. Left eye exhibits no discharge.  Neck: Normal range of motion. Neck supple. No tracheal deviation present.  Cardiovascular: Normal rate and regular rhythm.   Pulmonary/Chest: Effort normal and breath sounds normal.  Abdominal: Soft. She exhibits no distension. There is no tenderness. There is no guarding.  Musculoskeletal: She exhibits no edema and no tenderness.  Neurological: She is alert and oriented to person, place, and time.  Skin: Skin is warm. No rash noted.  Psychiatric: She has a normal mood and affect.    ED Course  Procedures (including critical care time) Labs Review Labs Reviewed  BASIC METABOLIC PANEL - Abnormal; Notable for the following:    Glucose, Bld 156 (*)    All other components within normal limits  APTT - Abnormal; Notable for the following:    aPTT 156 (*)    All other components within normal limits  COMPREHENSIVE METABOLIC PANEL - Abnormal; Notable for the following:    Glucose, Bld 125 (*)    All other components within normal limits  CBC  TROPONIN I  PROTIME-INR  HEMOGLOBIN A1C  TROPONIN I  TROPONIN I  TROPONIN I  POCT I-STAT TROPONIN I   Imaging Review No results found.  EKG Interpretation     Ventricular Rate:  88 PR Interval:  134 QRS Duration: 70 QT Interval:  368 QTC Calculation: 445 R Axis:   -42 Text Interpretation:  Normal sinus rhythm Left axis deviation Nonspecific ST abnormality Abnormal ECG Subtle elevation avL and depression inferior            MDM   1. Hypertension   2. Hyperlipidemia   3. GERD (gastroesophageal reflux disease)   4. Obesity   5. Fibromyalgia   6. Coronary atherosclerosis of native coronary artery   7. Old myocardial infarction   8. Type II or unspecified type diabetes mellitus without mention of complication, uncontrolled   9. Unstable angina    Clinically unstable  angina. No pain in ED. Discussed with cards for admission for cath.  Cardiology evaluated.  The patients results and plan were reviewed and discussed.   Any x-rays performed were personally reviewed by myself.   Differential diagnosis were considered with the presenting HPI.  Diagnosis: unstable angina, htn, chest pain, obesity, CAD  EKG: reviewed  Admission/ observation were discussed with the admitting physician, patient and/or family and they are comfortable with the plan.      Enid Skeens, MD 09/04/13 [phone]

## 2013-09-04 NOTE — CV Procedure (Addendum)
    CARDIAC CATHETERIZATION  PROCEDURE:  Left heart catheterization with selective coronary angiography, left ventriculogram via the radial artery approach.  INDICATIONS:  [age]-year-old female with previous drug eluting stent to the RCA as well as obtuse marginal branch in 2010 has been having progressive worsening symptoms utilization of nitroglycerin as well as beta blocker here with unstable angina.  The risks, benefits, and details of the procedure were explained to the patient, including possibilities of stroke, heart attack, death, renal impairment, arterial damage, bleeding.  The patient verbalized understanding and wanted to proceed.  Informed written consent was obtained.  PROCEDURE TECHNIQUE:  Allen's test was performed pre-and post procedure and was normal. The right radial artery site was prepped and draped in a sterile fashion. One percent lidocaine was used for local anesthesia. Using the modified Seldinger technique a 5 French hydrophilic sheath was inserted into the radial artery without difficulty. 3 mg of verapamil was administered via the sheath. A Judkins right #4 catheter with the guidance of a Versicore wire was placed in the right coronary cusp and selectively cannulated the right coronary artery. After traversing the aortic arch, 4000 units of heparin IV was administered. A Judkins left #3.5 catheter was used to selectively cannulate the left main artery. Multiple views with hand injection of Omnipaque were obtained. Catheter a pigtail catheter was used to cross into the left ventricle, hemodynamics were obtained, and a left ventriculogram was performed in the RAO position with power injection. Following the procedure, sheath was removed, patient was hemodynamically stable, hemostasis was maintained with a Terumo T band.   CONTRAST:  Total of 100 ml.    FLOUROSCOPY TIME: 4.0 min.  COMPLICATIONS:  None.    HEMODYNAMICS:  Aortic pressure was 113/51mmHg; LV systolic pressure was  ; LVEDP .  There was no gradient between the left ventricle and aorta.    ANGIOGRAPHIC DATA:    Left main: Branches into LAD as well the circumflex. No angiographic significant disease.  Left anterior descending (LAD): Mid LAD stenosis has progressed from 40% on prior catheterization in 2010 to now 80% past the septal branch. Mid LAD stenosis.  Circumflex artery (CIRC): Previously placed obtuse marginal stent is widely patent. No other angiographically significant disease.  Right coronary artery (RCA): There is 90% stenosis of proximal RCA, new lesion. PVC placed stent in the mid to distal RCA is widely patent.  LEFT VENTRICULOGRAM:  Left ventricular angiogram was done in the 30 RAO projection and revealed normal left ventricular wall motion and systolic function with an estimated ejection fraction of 65%.   IMPRESSIONS:  New hemodynamically significant lesion in the mid LAD as well as proximal RCA. Previously placed stents in RCA/OM widely patent. Normal left ventricular systolic function.  LVEDP 8 mmHg.  Ejection fraction 65%.  RECOMMENDATION:  We will proceed with percutaneous intervention of LAD and right coronary artery. Discussed with patient and Dr. Eldridge Dace.   TIMI risk - 4 Unstable angina (ACS) CCS 3

## 2013-09-05 DIAGNOSIS — E785 Hyperlipidemia, unspecified: Secondary | ICD-10-CM

## 2013-09-05 DIAGNOSIS — I10 Essential (primary) hypertension: Secondary | ICD-10-CM

## 2013-09-05 DIAGNOSIS — E669 Obesity, unspecified: Secondary | ICD-10-CM

## 2013-09-05 LAB — BASIC METABOLIC PANEL
BUN: 17 mg/dL (ref 6–23)
CO2: 22 mEq/L (ref 19–32)
Chloride: 104 mEq/L (ref 96–112)
Creatinine, Ser: 0.68 mg/dL (ref 0.50–1.10)
GFR calc non Af Amer: >90 mL/min (ref >90)

## 2013-09-05 LAB — LIPID PANEL
Cholesterol: 147 mg/dL (ref 0–200)
Total CHOL/HDL Ratio: 5.9 RATIO
Triglycerides: 185 mg/dL — ABNORMAL HIGH (ref <150)
VLDL: 37 mg/dL (ref 0–40)

## 2013-09-05 LAB — CBC
HCT: 35.6 % — ABNORMAL LOW (ref 36.0–46.0)
MCH: 31.3 pg (ref 26.0–34.0)
MCV: 91.3 fL (ref 78.0–100.0)
RBC: 3.9 MIL/uL (ref 3.87–5.11)
RDW: 13.3 % (ref 11.5–15.5)
WBC: 9.5 10*3/uL (ref 4.0–10.5)

## 2013-09-05 LAB — TROPONIN I: Troponin I: 0.42 ng/mL (ref <0.30)

## 2013-09-05 MED ORDER — NITROGLYCERIN 0.4 MG SL SUBL: 0.4000 mg | SUBLINGUAL_TABLET | SUBLINGUAL | Status: DC | PRN

## 2013-09-05 MED ORDER — ASPIRIN 81 MG PO CHEW: 81.0000 mg | CHEWABLE_TABLET | Freq: Every day | ORAL | Status: DC

## 2013-09-05 MED ORDER — TICAGRELOR 90 MG PO TABS: 90.0000 mg | ORAL_TABLET | Freq: Two times a day (BID) | ORAL | Status: DC

## 2013-09-05 MED ORDER — SITAGLIPTIN PHOS-METFORMIN HCL 50-1000 MG PO TABS: 1.0000 | ORAL_TABLET | Freq: Two times a day (BID) | ORAL | Status: DC

## 2013-09-05 NOTE — Progress Notes (Signed)
CARDIAC REHAB PHASE I   PRE:  Rate/Rhythm: 78 SR    BP: sitting 128/63    SaO2: 100ra  MODE:  Ambulation: 450 ft   POST:  Rate/Rhythm: 94 SR    BP: sitting 136/56     SaO2:    Tolerated well, no c/o. Ed completed. Pt admits she needs more exercise and plans to start walking with her husband. Not interested in CRPII. [phone]  Elissa Lovett [place] CES, ACSM 09/05/2013 8:35 AM

## 2013-09-05 NOTE — Discharge Summary (Signed)
Patient ID: Suzanne Watkins MRN: [id number] DOB/AGE: 1951/10/15 62 y.o.  Admit date: 09/04/2013 Discharge date: 09/06/2013  Primary Discharge Diagnosis unstable angina  Secondary Discharge Diagnosis coronary artery disease, hyperlipidemia, obesity  Significant Diagnostic Studies: angiography: Cardiac cath revealing significant disease in the mid LAD and proximal RCA. LAD stented with a 2.5 x 16 promus drug-eluting stent, postdilated to 2.75 mm.  RCA stented with a 2.75 x 16 promise drug-eluting stent, postdilated to 3 mm.  Consults: None  Hospital Course: 62 year old woman who had been noticing angina coming on with less and less activity. She was using nitroglycerin just to walk from the parking lot to her place of work. She came to the emergency room. Initial cardiac enzymes were negative but her history was concerning for unstable angina. She underwent cardiac catheterization on 09/04/2013. This revealed significant two-vessel coronary artery disease. She had the interventions as noted above. Her right radial artery was accessed. It is difficult to access this vessel. Ultrasound guidance was needed. She had pain during the procedure likely from vasospasm. Overall, she tolerated the procedure well. The next day, she walked with cardiac rehabilitation and had no difficulties. She thought that if she had done that type of walk prior to the intervention, she would've had chest discomfort. Her triglycerides were noted to be mildly elevated and her HDL was low. We talked about the importance of increasing exercise.   Discharge Exam: Blood pressure 128/63, pulse 80, temperature 98.4 F (36.9 C), temperature source Oral, resp. rate 18, height 5\' 3"  (1.6 m), weight 185 lb 13.6 oz (84.3 kg), SpO2 98.00%.   Graf/18 RRR, S1-S2 CTA bilaterally Obese 2+ right radial pulse, no hematoma Labs:   Lab Results  Component Value Date   WBC 9.5 09/05/2013   HGB 12.2 09/05/2013   HCT 35.6* 09/05/2013   MCV 91.3  09/05/2013   PLT 190 09/05/2013     Recent Labs Lab 09/04/13 1550 09/05/13 0226  NA 135 139  K 4.3 4.2  CL 99 104  CO2 22 22  BUN 16 17  CREATININE 0.60 0.68  CALCIUM 9.7 9.6  PROT 6.9  --   BILITOT 0.5  --   ALKPHOS 83  --   ALT 21  --   AST 23  --   GLUCOSE 125* 132*   Lab Results  Component Value Date   CKTOTAL 59 08/07/2009   CKMB 8.8* 08/07/2009   TROPONINI 0.42* 09/05/2013    Lab Results  Component Value Date   CHOL 147 09/05/2013   CHOL 154 09/04/2013   CHOL  Value: 245        ATP III CLASSIFICATION:  <200     mg/dL   Desirable  409-811  mg/dL   Borderline High  >=914    mg/dL   High       * 78/12/9560   Lab Results  Component Value Date   HDL 25* 09/05/2013   HDL 35.60* 09/04/2013   HDL 40 08/07/2009   Lab Results  Component Value Date   LDLCALC 85 09/05/2013   LDLCALC 84 09/04/2013   LDLCALC  Value: 161        Total Cholesterol/HDL:CHD Risk Coronary Heart Disease Risk Table                     Men   Women  1/2 Average Risk   3.4   3.3  Average Risk       5.0   4.4  2 X Average  Risk   9.6   7.1  3 X Average Risk  23.4   11.0        Use the calculated Patient Ratio above and the CHD Risk Table to determine the patient's CHD Risk.        ATP III CLASSIFICATION (LDL):  <100     mg/dL   Optimal  161-096  mg/dL   Near or Above                    Optimal  130-159  mg/dL   Borderline  045-409  mg/dL   High  >811     mg/dL   Very High* 91/01/7828   Lab Results  Component Value Date   TRIG 185* 09/05/2013   TRIG 170.0* 09/04/2013   TRIG 222* 08/07/2009   Lab Results  Component Value Date   CHOLHDL 5.9 09/05/2013   CHOLHDL 4 09/04/2013   CHOLHDL 6.1 08/07/2009   No results found for this basename: LDLDIRECT      EKG:  Normal sinus rhythm, nonspecific ST segment changes inferiorly  FOLLOW UP PLANS AND APPOINTMENTS      Future Appointments Provider Department Dept Phone   09/23/2013 11:15 AM Everette Rank, MD [facility] [phone]       Medication  List    STOP taking these medications       aspirin 325 MG tablet  Replaced by:  aspirin 81 MG chewable tablet     ibuprofen 200 MG tablet  Commonly known as:  ADVIL,MOTRIN      TAKE these medications       aspirin 81 MG chewable tablet  Chew 1 tablet (81 mg total) by mouth daily.     atorvastatin 80 MG tablet  Commonly known as:  LIPITOR  Take 80 mg by mouth at bedtime.     diphenhydrAMINE 50 MG capsule  Commonly known as:  BENADRYL  Take 100 mg by mouth at bedtime as needed for sleep.     metoprolol succinate 25 MG 24 hr tablet  Commonly known as:  TOPROL-XL  Take 25 mg by mouth daily.     multivitamin with minerals Tabs tablet  Take 1 tablet by mouth daily.     nitroGLYCERIN 0.4 MG SL tablet  Commonly known as:  NITROSTAT  Place 1 tablet (0.4 mg total) under the tongue every 5 (five) minutes as needed for chest pain.     omeprazole 20 MG capsule  Commonly known as:  PRILOSEC  Take 20 mg by mouth daily.     sitaGLIPtin-metformin 50-1000 MG per tablet  Commonly known as:  JANUMET  Take 1 tablet by mouth 2 (two) times daily with a meal.     Ticagrelor 90 MG Tabs tablet  Commonly known as:  BRILINTA  Take 1 tablet (90 mg total) by mouth 2 (two) times daily.     valsartan 80 MG tablet  Commonly known as:  DIOVAN  Take 80 mg by mouth daily.       Follow-up Information   Follow up with Corky Crafts., MD In 2 weeks.   Specialty:  Cardiology   Contact information:   [address] [place] [address] [phone]       BRING ALL MEDICATIONS WITH YOU TO FOLLOW UP APPOINTMENTS  Time spent with patient to include physician time: 25 minutes Signed: Hulda Reddix S. 09/06/2013, 8:09 AM

## 2013-09-05 NOTE — Progress Notes (Signed)
CRITICAL VALUE ALERT  Critical value received:  Troponin=0.42  Date of notification:  09/05/13  Time of notification:  04:40 AM  Critical value read back:yes  Nurse who received alert:  Sander Radon RN  MD notified (1st page):  Dr. Terressa Koyanagi  Time of first page:  05:05 AM  MD notified (2nd page):  Time of second page:  Responding MD:  Dr. Terressa Koyanagi  Time MD responded:  05:10 AM

## 2013-09-05 NOTE — Progress Notes (Signed)
Utilization Review Completed.Dorcas Carrow T11/03/2013

## 2013-09-12 ENCOUNTER — Telehealth: Payer: Self-pay | Admitting: Cardiology

## 2013-09-12 DIAGNOSIS — E785 Hyperlipidemia, unspecified: Secondary | ICD-10-CM

## 2013-09-12 MED ORDER — EZETIMIBE 10 MG PO TABS: 10.0000 mg | ORAL_TABLET | Freq: Every day | ORAL | Status: DC

## 2013-09-12 NOTE — Telephone Encounter (Signed)
Pt notified. Meds updated and sent in. Labs ordered.

## 2013-09-12 NOTE — Telephone Encounter (Signed)
Message copied by Theda Sers on Thu Sep 12, 2013  4:41 PM ------      Message from: SMART, Gaspar Skeeters      Created: Thu Sep 05, 2013  5:36 PM       RF:  PCI yesterday for UA, h/o MI, Family h/o CAD, Diabetes, HTN, age - LDL goal < 70, non-HDL goal < 100.      Meds:  Atorvastatin 80 mg qd.        Given recent disease and LDL (84) and non-HDL (118) elevated, will add Zetia 10 mg qd and check an NMR in 3 months to assess LDL-P (as diabetic and family h/o CAD).      LFTs normal.      Plan:      1.  Add Zetia 10 mg qd.      2.  Continue atorvastatin 80 mg qd.      3.  Check an NMR LipoProfile and ALT in 3 months.      Please notify patient, update meds, and set up labs. Thanks. ------

## 2013-09-21 ENCOUNTER — Encounter: Payer: Self-pay | Admitting: Interventional Cardiology

## 2013-09-23 ENCOUNTER — Ambulatory Visit (INDEPENDENT_AMBULATORY_CARE_PROVIDER_SITE_OTHER): Payer: BC Managed Care – PPO | Admitting: Interventional Cardiology

## 2013-09-23 ENCOUNTER — Encounter: Payer: Self-pay | Admitting: Interventional Cardiology

## 2013-09-23 VITALS — BP 134/84 | HR 85 | Ht 63.0 in | Wt 184.0 lb

## 2013-09-23 DIAGNOSIS — I10 Essential (primary) hypertension: Secondary | ICD-10-CM

## 2013-09-23 DIAGNOSIS — I2510 Atherosclerotic heart disease of native coronary artery without angina pectoris: Secondary | ICD-10-CM

## 2013-09-23 DIAGNOSIS — I252 Old myocardial infarction: Secondary | ICD-10-CM

## 2013-09-23 DIAGNOSIS — E669 Obesity, unspecified: Secondary | ICD-10-CM

## 2013-09-23 DIAGNOSIS — E785 Hyperlipidemia, unspecified: Secondary | ICD-10-CM

## 2013-09-23 NOTE — Progress Notes (Signed)
Patient ID: Suzanne Watkins, female   DOB: 1951/03/14, 62 y.o.   MRN: [id number]    [address] [place], [place]  [address] Phone: [phone] Fax:  [phone]  Date:  09/23/2013   ID:  Suzanne Watkins, DOB Mar 22, 1951, MRN [id number]  PCP:  Carmelina Dane, MD      History of Present Illness: Suzanne Watkins is a 62 y.o. female who has stents in her RCA and circumflex.  She had more angina.  She had another RCA stent and an LAD stent or [place].   Since then: CAD/ASCVD:  Denies : Chest pain.  Dizziness.  Leg edema.  Nitroglycerin.  Orthopnea.  Palpitations.  Paroxysmal nocturnal dyspnea.  Syncope.  walking some for exercise. uses stairs. no angina with that.    Wt Readings from Last 3 Encounters:  09/23/13 184 lb (83.462 kg)  09/05/13 185 lb 13.6 oz (84.3 kg)  09/05/13 185 lb 13.6 oz (84.3 kg)     Past Medical History  Diagnosis Date  . Hypertension   . Hyperlipidemia   . GERD (gastroesophageal reflux disease)   . Obesity   . Coronary atherosclerosis of native coronary artery 09/04/2013    RCA and obtuse marginal stent/DES-2010  . Myocardial infarction 07/2009    pt states that it was a minor heart attack, w/ no damage  . Shortness of breath     "just related to angina I was having recently" (09/04/2013)  . Type II diabetes mellitus   . Arthritis     "probably in my hands" (09/04/2013)  . Fibromyalgia     "dx'd in 1994"  . Cervical cancer 1986  . 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 aspirin 81 MG chewable tablet Chew 1 tablet (81 mg total) by mouth daily.      Marland Kitchen atorvastatin (LIPITOR) 80 MG tablet Take 80 mg by mouth at bedtime.       . diphenhydrAMINE (BENADRYL) 50 MG capsule Take 100 mg by mouth at bedtime as needed for sleep.      Marland Kitchen ezetimibe (ZETIA) 10 MG tablet Take 1 tablet (10 mg total) by mouth daily.  90 tablet  1  . metoprolol succinate (TOPROL-XL) 25 MG 24 hr  tablet Take 25 mg by mouth daily.      . Multiple Vitamin (MULTIVITAMIN WITH MINERALS) TABS tablet Take 1 tablet by mouth daily.      . nitroGLYCERIN (NITROSTAT) 0.4 MG SL tablet Place 1 tablet (0.4 mg total) under the tongue every 5 (five) minutes as needed for chest pain.  25 tablet  12  . omeprazole (PRILOSEC) 20 MG capsule Take 20 mg by mouth daily.      . sitaGLIPtin-metformin (JANUMET) 50-1000 MG per tablet Take 1 tablet by mouth 2 (two) times daily with a meal.  180 tablet  1  . Ticagrelor (BRILINTA) 90 MG TABS tablet Take 1 tablet (90 mg total) by mouth 2 (two) times daily.  60 tablet  11  . valsartan (DIOVAN) 80 MG tablet Take 80 mg by mouth daily.       No current facility-administered medications for this visit.    Allergies:    Allergies  Allergen Reactions  . Plavix [Clopidogrel Bisulfate] Other (See Comments)    Stomach upset    Social History:  The patient  reports that she quit smoking about 7 years ago. Her smoking use included Cigarettes. She has a 80  pack-year smoking history. She has never used smokeless tobacco. She reports that she does not drink alcohol or use illicit drugs.   Family History:  The patient's family history includes Cancer in her father; Diabetes in her daughter; Drug abuse in her brother and daughter; Heart disease in her daughter, mother, and paternal grandmother; Hyperlipidemia in her brother and mother; Hypertension in her brother; Lupus in her daughter; Stroke in her paternal grandfather.   ROS:  Please see the history of present illness.  No nausea, vomiting.  No fevers, chills.  No focal weakness.  No dysuria.  Angina resolved after most recent stents in October 2014. She is not planning on doing cardiac rehabilitation.  All other systems reviewed and negative.   PHYSICAL EXAM: VS:  BP 134/84  Pulse 85  Ht 5\' 3"  (1.6 m)  Wt 184 lb (83.462 kg)  BMI 32.60 kg/m2 Well nourished, well developed, in no acute distress HEENT: normal Neck: no JVD,  no carotid bruits Cardiac:  normal S1, S2; RRR;  Lungs:  clear to auscultation bilaterally, no wheezing, rhonchi or rales Abd: soft, nontender, no hepatomegaly Ext: no edema, 1+ right radial pulse; small area of bruising Skin: warm and dry Neuro:   no focal abnormalities noted  EKG:       ASSESSMENT AND PLAN:  Coronary atherosclerosis of native coronary artery  Continue Brilinta Tablet, 90 MG, 1 tablet, Orally, twice a day, (maintanence aspirin should be less than 100 mg when used with Brilinta), 60  Aspirin EC Lo-Dose Tablet Delayed Release, 81 MG, 1 tablet, Orally, Once a day, 30 day(s), 30 Refill Nitroglycerin 0.4 mg tablet, 0.4 mg, 1 tablet as directed, SL, as directed prn chest pain, 1 vial, Refills 6 IMAGING: EKG   Watkins,Suzanne 07/19/2013 11:52:20 AM > Suzanne Watkins,JAY 07/19/2013 12:23:02 PM > NSR, no ST segment changes   Notes: No angina. Old Stents are nearly 60 years old. Back on  Brilinta due to recent stents.  2. Essential hypertension, benign  Continue Diovan Tablet, 80 MG, 1 tablet, Orally, Once a day Continue Metoprolol Succinate Tablet Extended Release 24 Hour, 25 MG, 1 tablet, Orally, Once a day  3. Mixed hyperlipidemia  Continue Atorvastatin Calcium Tablet, 80 MG, 1 tablet, Orally, Once a day, 30 day(s), 30, Refills 11  Notes: LDL controlled in March. HDL low at 28. Not interested in study for new drug to raise HDL. LDL 85; HDL 25, TG 185 in 16/10. 4. Obesity, unspecified  Notes: She has lost weight with lifestyle modifications.    Signed, Fredric Mare, MD, [facility] 09/23/2013 12:07 PM

## 2013-09-23 NOTE — Patient Instructions (Signed)
Your physician recommends that you continue on your current medications as directed. Please refer to the Current Medication list given to you today.  Your physician recommends that you schedule a follow-up appointment in: 4 months with Dr. Varanasi.  

## 2013-10-22 ENCOUNTER — Other Ambulatory Visit: Payer: Self-pay | Admitting: Interventional Cardiology

## 2013-10-22 ENCOUNTER — Encounter: Payer: Self-pay | Admitting: Interventional Cardiology

## 2013-10-23 ENCOUNTER — Other Ambulatory Visit: Payer: Self-pay | Admitting: *Deleted

## 2013-10-23 MED ORDER — EZETIMIBE 10 MG PO TABS: 10.0000 mg | ORAL_TABLET | Freq: Every day | ORAL | Status: DC

## 2013-10-23 NOTE — Telephone Encounter (Signed)
Called pt and left message explaining prescription receipt confirmed by pharmacy and available for pt pick up

## 2013-11-03 ENCOUNTER — Other Ambulatory Visit: Payer: Self-pay | Admitting: Interventional Cardiology

## 2013-11-19 ENCOUNTER — Other Ambulatory Visit: Payer: Self-pay | Admitting: Interventional Cardiology

## 2013-12-06 ENCOUNTER — Other Ambulatory Visit: Payer: Self-pay | Admitting: Interventional Cardiology

## 2013-12-16 ENCOUNTER — Other Ambulatory Visit (INDEPENDENT_AMBULATORY_CARE_PROVIDER_SITE_OTHER): Payer: BC Managed Care – PPO

## 2013-12-16 DIAGNOSIS — E785 Hyperlipidemia, unspecified: Secondary | ICD-10-CM

## 2013-12-16 LAB — HEPATIC FUNCTION PANEL
ALBUMIN: 4 g/dL (ref 3.5–5.2)
ALT: 26 U/L (ref 0–35)
AST: 22 U/L (ref 0–37)
Alkaline Phosphatase: 71 U/L (ref 39–117)
BILIRUBIN TOTAL: 0.4 mg/dL (ref 0.3–1.2)
Bilirubin, Direct: 0 mg/dL (ref 0.0–0.3)
Total Protein: 6.9 g/dL (ref 6.0–8.3)

## 2013-12-18 LAB — NMR LIPOPROFILE WITH LIPIDS
Cholesterol, Total: 111 mg/dL (ref <200)
HDL Particle Number: 23.9 umol/L — ABNORMAL LOW (ref >=30.5)
HDL SIZE: 8.4 nm — AB (ref >=9.2)
HDL-C: 31 mg/dL — ABNORMAL LOW (ref >=40)
LDL CALC: 52 mg/dL (ref <100)
LDL Particle Number: 1120 nmol/L — ABNORMAL HIGH (ref <1000)
LDL SIZE: 19.7 nm — AB (ref >20.5)
LP-IR SCORE: 77 — AB (ref <=45)
Large HDL-P: <1.3 umol/L — ABNORMAL LOW (ref >=4.8)
Large VLDL-P: 5.8 nmol/L — ABNORMAL HIGH (ref <=2.7)
SMALL LDL PARTICLE NUMBER: 902 nmol/L — AB (ref <=527)
Triglycerides: 139 mg/dL (ref <150)
VLDL Size: 51 nm — ABNORMAL HIGH (ref <=46.6)

## 2013-12-27 ENCOUNTER — Telehealth: Payer: Self-pay | Admitting: Cardiology

## 2013-12-27 ENCOUNTER — Other Ambulatory Visit: Payer: Self-pay | Admitting: Interventional Cardiology

## 2013-12-27 DIAGNOSIS — E785 Hyperlipidemia, unspecified: Secondary | ICD-10-CM

## 2013-12-27 MED ORDER — PSYLLIUM 30.9 % PO POWD: ORAL | Status: DC

## 2013-12-27 NOTE — Telephone Encounter (Signed)
Meds updated and labs ordered.  

## 2014-01-27 ENCOUNTER — Ambulatory Visit: Payer: BC Managed Care – PPO | Admitting: Interventional Cardiology

## 2014-01-29 ENCOUNTER — Other Ambulatory Visit: Payer: Self-pay | Admitting: Interventional Cardiology

## 2014-02-07 ENCOUNTER — Other Ambulatory Visit: Payer: Self-pay | Admitting: Interventional Cardiology

## 2014-02-26 ENCOUNTER — Ambulatory Visit: Payer: BC Managed Care – PPO | Admitting: Interventional Cardiology

## 2014-02-27 ENCOUNTER — Other Ambulatory Visit: Payer: Self-pay | Admitting: Interventional Cardiology

## 2014-03-02 ENCOUNTER — Other Ambulatory Visit: Payer: Self-pay | Admitting: Interventional Cardiology

## 2014-03-18 ENCOUNTER — Encounter: Payer: Self-pay | Admitting: Interventional Cardiology

## 2014-03-18 ENCOUNTER — Ambulatory Visit (INDEPENDENT_AMBULATORY_CARE_PROVIDER_SITE_OTHER): Payer: BC Managed Care – PPO | Admitting: Interventional Cardiology

## 2014-03-18 VITALS — BP 156/72 | HR 70 | Ht 62.0 in | Wt 187.0 lb

## 2014-03-18 DIAGNOSIS — I252 Old myocardial infarction: Secondary | ICD-10-CM

## 2014-03-18 DIAGNOSIS — I2510 Atherosclerotic heart disease of native coronary artery without angina pectoris: Secondary | ICD-10-CM

## 2014-03-18 DIAGNOSIS — E669 Obesity, unspecified: Secondary | ICD-10-CM

## 2014-03-18 DIAGNOSIS — E785 Hyperlipidemia, unspecified: Secondary | ICD-10-CM

## 2014-03-18 NOTE — Progress Notes (Signed)
Patient ID: Nafeesa Dils, female   DOB: 09/22/1951, 63 y.o.   MRN: [id number] Patient ID: Tionna Gigante, female   DOB: 1951-02-13, 63 y.o.   MRN: [id number]    [place], [place], [place]  [address] Phone: [phone] Fax:  [phone]  Date:  03/18/2014   ID:  Hatley Henegar, DOB 10-12-1951, MRN [id number]  PCP:  Roselee Culver, MD      History of Present Illness: Mayan Dolney is a 63 y.o. female who has stents in her RCA and circumflex.  She had more angina.  She had another RCA stent and an LAD stent or [place].  Walking limited by hip. Since then: CAD/ASCVD:  Denies : Chest pain.  Dizziness.  Leg edema.  Nitroglycerin.  Orthopnea.  Palpitations.  Paroxysmal nocturnal dyspnea.  Syncope.  walking some for exercise. uses stairs. no angina with that.    Wt Readings from Last 3 Encounters:  03/18/14 187 lb (84.823 kg)  09/23/13 184 lb (83.462 kg)  09/05/13 185 lb 13.6 oz (84.3 kg)     Past Medical History  Diagnosis Date  . Hypertension   . Hyperlipidemia   . GERD (gastroesophageal reflux disease)   . Obesity   . Coronary atherosclerosis of native coronary artery 09/04/2013    RCA and obtuse marginal stent/DES-2010  . Myocardial infarction 07/2009    pt states that it was a minor heart attack, w/ no damage  . Shortness of breath     "just related to angina I was having recently" (09/04/2013)  . Type II diabetes mellitus   . Arthritis     "probably in my hands" (09/04/2013)  . Fibromyalgia     "dx'd in 1994"  . Cervical cancer 1986  . 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 aspirin 81 MG chewable tablet Chew 1 tablet (81 mg total) by mouth daily.      Marland Kitchen atorvastatin (LIPITOR) 80 MG tablet TAKE 1 TABLET BY MOUTH DAILY  90 tablet  0  . diphenhydrAMINE (BENADRYL) 50 MG capsule Take 100 mg by mouth at bedtime as needed for sleep.      Marland Kitchen ezetimibe (ZETIA) 10 MG tablet Take 1  tablet (10 mg total) by mouth daily.  30 tablet  5  . metoprolol succinate (TOPROL-XL) 25 MG 24 hr tablet TAKE 1 TABLET BY MOUTH EVERY DAY  30 tablet  0  . Multiple Vitamin (MULTIVITAMIN WITH MINERALS) TABS tablet Take 1 tablet by mouth daily.      . nitroGLYCERIN (NITROSTAT) 0.4 MG SL tablet Place 1 tablet (0.4 mg total) under the tongue every 5 (five) minutes as needed for chest pain.  25 tablet  12  . omeprazole (PRILOSEC) 20 MG capsule Take 20 mg by mouth daily.      . Psyllium (METAMUCIL) 30.9 % POWD 1 tablespoon with water or juice daily      . sitaGLIPtin-metformin (JANUMET) 50-1000 MG per tablet Take 1 tablet by mouth 2 (two) times daily with a meal.  180 tablet  1  . Ticagrelor (BRILINTA) 90 MG TABS tablet Take 1 tablet (90 mg total) by mouth 2 (two) times daily.  60 tablet  11  . valsartan (DIOVAN) 80 MG tablet TAKE 1 TABLET BY MOUTH DAILY  90 tablet  0   No current facility-administered medications for this visit.    Allergies:    Allergies  Allergen Reactions  . Plavix [Clopidogrel  Bisulfate] Other (See Comments)    Stomach upset    Social History:  The patient  reports that she quit smoking about 7 years ago. Her smoking use included Cigarettes. She has a 29 pack-year smoking history. She has never used smokeless tobacco. She reports that she does not drink alcohol or use illicit drugs.   Family History:  The patient's family history includes Cancer in her father; Diabetes in her daughter; Drug abuse in her brother and daughter; Heart disease in her daughter, mother, and paternal grandmother; Hyperlipidemia in her brother and mother; Hypertension in her brother; Lupus in her daughter; Stroke in her paternal grandfather.   ROS:  Please see the history of present illness.  No nausea, vomiting.  No fevers, chills.  No focal weakness.  No dysuria.  Angina resolved after most recent stents in October 2014. She is not planning on doing cardiac rehabilitation.  All other systems  reviewed and negative.   PHYSICAL EXAM: VS:  BP 156/72  Pulse 70  Ht 5\' 2"  (1.575 m)  Wt 187 lb (84.823 kg)  BMI 34.19 kg/m2 Well nourished, well developed, in no acute distress HEENT: normal Neck: no JVD, no carotid bruits Cardiac:  normal S1, S2; RRR;  Lungs:  clear to auscultation bilaterally, no wheezing, rhonchi or rales Abd: soft, nontender, no hepatomegaly Ext: no edema, 1+ right radial pulse; small area of bruising Skin: warm and dry Neuro:   no focal abnormalities noted       ASSESSMENT AND PLAN:  Coronary atherosclerosis of native coronary artery  Continue Brilinta Tablet, 90 MG, 1 tablet, Orally, twice a day, (maintanence aspirin should be less than 100 mg when used with Brilinta), 60  Aspirin EC Lo-Dose Tablet Delayed Release, 81 MG, 1 tablet, Orally, Once a day, 30 day(s), 30 Refill Nitroglycerin 0.4 mg tablet, 0.4 mg, 1 tablet as directed, SL, as directed prn chest pain, 1 vial, Refills 6   Notes: No angina. Old Stents are nearly 61 years old. Back on  Brilinta due to recent stents in 11/14.  2. Essential hypertension, benign  Continue Diovan Tablet, 80 MG, 1 tablet, Orally, Once a day Continue Metoprolol Succinate Tablet Extended Release 24 Hour, 25 MG, 1 tablet, Orally, Once a day BP in the 481E and below systolic at home.  3. Mixed hyperlipidemia  Continue Atorvastatin Calcium Tablet, 80 MG, 1 tablet, Orally, Once a day, 30 day(s), 30, Refills 11  Notes: LDL controlled in March. HDL low at 28. Not interested in study for new drug to raise HDL. LDL 85; HDL 25, TG 185 in 10/14.; 2/15: LDL 52; TG 139.  Better on Zetia. 4. Obesity, unspecified  Notes: She has lost weight with lifestyle modifications. Look for exercises that she can do that do not irritate her hip.     Signed, Mina Marble, MD, [facility] 03/18/2014 4:02 PM

## 2014-03-18 NOTE — Patient Instructions (Signed)
Your physician recommends that you continue on your current medications as directed. Please refer to the Current Medication list given to you today.   Your physician wants you to follow-up in: 6 MONTHS WITH DR VARANASI You will receive a reminder letter in the mail two months in advance. If you don't receive a letter, please call our office to schedule the follow-up appointment.  

## 2014-03-29 ENCOUNTER — Other Ambulatory Visit: Payer: Self-pay | Admitting: Interventional Cardiology

## 2014-04-28 ENCOUNTER — Ambulatory Visit (INDEPENDENT_AMBULATORY_CARE_PROVIDER_SITE_OTHER): Payer: BC Managed Care – PPO | Admitting: *Deleted

## 2014-04-28 DIAGNOSIS — E785 Hyperlipidemia, unspecified: Secondary | ICD-10-CM

## 2014-04-28 LAB — HEPATIC FUNCTION PANEL
ALT: 24 U/L (ref 0–35)
AST: 24 U/L (ref 0–37)
Albumin: 4.3 g/dL (ref 3.5–5.2)
Alkaline Phosphatase: 70 U/L (ref 39–117)
BILIRUBIN DIRECT: 0.1 mg/dL (ref 0.0–0.3)
TOTAL PROTEIN: 7.3 g/dL (ref 6.0–8.3)
Total Bilirubin: 0.4 mg/dL (ref 0.2–1.2)

## 2014-04-29 LAB — NMR LIPOPROFILE WITH LIPIDS
Cholesterol, Total: 112 mg/dL (ref <200)
HDL Particle Number: 26.9 umol/L — ABNORMAL LOW (ref >=30.5)
HDL Size: 8.1 nm — ABNORMAL LOW (ref >=9.2)
HDL-C: 33 mg/dL — ABNORMAL LOW (ref >=40)
LDL (calc): 43 mg/dL (ref <100)
LDL Particle Number: 998 nmol/L (ref <1000)
LDL Size: 19.9 nm — ABNORMAL LOW (ref >20.5)
LP-IR Score: 85 — ABNORMAL HIGH (ref <=45)
Large VLDL-P: 9.1 nmol/L — ABNORMAL HIGH (ref <=2.7)
Small LDL Particle Number: 722 nmol/L — ABNORMAL HIGH (ref <=527)
TRIGLYCERIDES: 178 mg/dL — AB (ref <150)
VLDL Size: 55.1 nm — ABNORMAL HIGH (ref <=46.6)

## 2014-05-01 ENCOUNTER — Other Ambulatory Visit: Payer: Self-pay | Admitting: Interventional Cardiology

## 2014-05-05 NOTE — Progress Notes (Signed)
Agree with Jeremy's recs

## 2014-05-09 ENCOUNTER — Other Ambulatory Visit: Payer: Self-pay | Admitting: Cardiology

## 2014-05-09 ENCOUNTER — Encounter: Payer: Self-pay | Admitting: Family Medicine

## 2014-05-09 ENCOUNTER — Ambulatory Visit (INDEPENDENT_AMBULATORY_CARE_PROVIDER_SITE_OTHER): Payer: BC Managed Care – PPO | Admitting: Family Medicine

## 2014-05-09 VITALS — BP 149/76 | HR 89 | Temp 98.9°F | Resp 16 | Ht 62.25 in | Wt 183.8 lb

## 2014-05-09 DIAGNOSIS — Z79899 Other long term (current) drug therapy: Secondary | ICD-10-CM

## 2014-05-09 DIAGNOSIS — IMO0001 Reserved for inherently not codable concepts without codable children: Secondary | ICD-10-CM

## 2014-05-09 DIAGNOSIS — Z23 Encounter for immunization: Secondary | ICD-10-CM

## 2014-05-09 DIAGNOSIS — E1165 Type 2 diabetes mellitus with hyperglycemia: Principal | ICD-10-CM

## 2014-05-09 DIAGNOSIS — E785 Hyperlipidemia, unspecified: Secondary | ICD-10-CM

## 2014-05-09 DIAGNOSIS — I10 Essential (primary) hypertension: Secondary | ICD-10-CM

## 2014-05-09 LAB — POCT URINALYSIS DIPSTICK
BILIRUBIN UA: NEGATIVE
Blood, UA: NEGATIVE
Glucose, UA: NEGATIVE
Ketones, UA: NEGATIVE
LEUKOCYTES UA: NEGATIVE
Nitrite, UA: NEGATIVE
PROTEIN UA: NEGATIVE
Spec Grav, UA: 1.01
Urobilinogen, UA: 0.2
pH, UA: 5.5

## 2014-05-09 LAB — BASIC METABOLIC PANEL
BUN: 21 mg/dL (ref 6–23)
CO2: 23 mEq/L (ref 19–32)
Calcium: 10.3 mg/dL (ref 8.4–10.5)
Chloride: 101 mEq/L (ref 96–112)
Creat: 0.74 mg/dL (ref 0.50–1.10)
GLUCOSE: 104 mg/dL — AB (ref 70–99)
Potassium: 4.9 mEq/L (ref 3.5–5.3)
Sodium: 136 mEq/L (ref 135–145)

## 2014-05-09 LAB — GLUCOSE, POCT (MANUAL RESULT ENTRY): POC Glucose: 117 mg/dl — AB (ref 70–99)

## 2014-05-09 LAB — POCT GLYCOSYLATED HEMOGLOBIN (HGB A1C): HEMOGLOBIN A1C: 5.7

## 2014-05-09 MED ORDER — VALSARTAN 160 MG PO TABS: ORAL_TABLET | ORAL | Status: DC

## 2014-05-09 MED ORDER — ZOSTER VACCINE LIVE 19400 UNT/0.65ML ~~LOC~~ SOLR: 0.6500 mL | Freq: Once | SUBCUTANEOUS | Status: DC

## 2014-05-09 MED ORDER — SITAGLIPTIN PHOS-METFORMIN HCL 50-1000 MG PO TABS: 1.0000 | ORAL_TABLET | Freq: Two times a day (BID) | ORAL | Status: DC

## 2014-05-09 NOTE — Progress Notes (Signed)
Subjective:  This chart was scribed for Delman Cheadle, MD by Roxan Diesel, Scribe.  This patient was seen in [place] [facility] and the patient's care was started at 3:11 PM.   Patient ID: Suzanne Watkins, female    DOB: [birth year], 63 y.o.   MRN: [id number]  Chief Complaint  Patient presents with  . Medication Refill    janumet refill    HPI  HPI Comments: Suzanne Watkins is a 63 y.o. female who presents to [facility] for diabetes f/u and Janumet refill.  Diabetes: Pt was last seen for her diabetes 1 year ago.  At that time her hemoglobin A1C had improved significantly on Janumet, down to 6.3.  She was also working hard on TLC after having gone to diabetic education.  She is working hard to keep her sugar low.  She denies side effects from [place].  Cardiac disease: Pt had a hospitalization in November for unstable angina, with a cardiac cath and had 2 additional stents placed.  She had 2 placed previously and now has 4 stents.  She was discharged to cardiac rehab.  Pt was continued on Lipitor 80 and started on Zetia by her cardiologist.  She is now to follow-up with her cardiologist every 6 months.  Her next appointment is in November.    Hypertension: Home BP has ranged from 101-751 systolic.  It is 149/76 on arrival today.  She is taking Diovan 80mg  with no side effects.   Patient Active Problem List   Diagnosis Date Noted  . Unstable angina 09/04/2013  . Coronary atherosclerosis of native coronary artery 09/04/2013  . 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 Hypertension   . Hyperlipidemia   . GERD (gastroesophageal reflux disease)   . Obesity   . Fibromyalgia   . Type II or unspecified type diabetes mellitus without mention of complication, uncontrolled 05/18/2013    Past Medical History  Diagnosis Date  . Hypertension   . Hyperlipidemia   . GERD (gastroesophageal reflux disease)   . Obesity   . Coronary atherosclerosis of native coronary artery 09/04/2013    RCA and obtuse  marginal stent/DES-2010  . Myocardial infarction 07/2009    pt states that it was a minor heart attack, w/ no damage  . Shortness of breath     "just related to angina I was having recently" (09/04/2013)  . Type II diabetes mellitus   . Arthritis     "probably in my hands" (09/04/2013)  . Fibromyalgia     "dx'd in 1994"  . Cervical cancer 1986  . 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 aspirin 81 MG chewable tablet Chew 1 tablet (81 mg total) by mouth daily.      Marland Kitchen atorvastatin (LIPITOR) 80 MG tablet TAKE 1 TABLET BY MOUTH EVERY DAY  90 tablet  1  . diphenhydrAMINE (BENADRYL) 50 MG capsule Take 100 mg by mouth at bedtime as needed for sleep.      . metoprolol succinate (TOPROL-XL) 25 MG 24 hr tablet TAKE 1 TABLET BY MOUTH EVERY DAY  90 tablet  1  . Multiple Vitamin (MULTIVITAMIN WITH MINERALS) TABS tablet Take 1 tablet by mouth daily.      . nitroGLYCERIN (NITROSTAT) 0.4 MG SL tablet Place 1 tablet (0.4 mg total) under the tongue every 5 (five) minutes as needed for chest pain.  25 tablet  12  . omeprazole (PRILOSEC) 20 MG  capsule Take 20 mg by mouth daily.      . Psyllium (METAMUCIL) 30.9 % POWD 1 tablespoon with water or juice daily      . sitaGLIPtin-metformin (JANUMET) 50-1000 MG per tablet Take 1 tablet by mouth 2 (two) times daily with a meal.  180 tablet  1  . Ticagrelor (BRILINTA) 90 MG TABS tablet Take 1 tablet (90 mg total) by mouth 2 (two) times daily.  60 tablet  11  . valsartan (DIOVAN) 80 MG tablet TAKE 1 TABLET BY MOUTH DAILY  90 tablet  0  . ZETIA 10 MG tablet TAKE 1 TABLET BY MOUTH DAILY  30 tablet  3   No current facility-administered medications on file prior to visit.    Allergies  Allergen Reactions  . Plavix [Clopidogrel Bisulfate] Other (See Comments)    Stomach upset     Review of Systems  Constitutional: Negative for fever, activity change, appetite change and  unexpected weight change.  Respiratory: Negative for cough and shortness of breath.   Cardiovascular: Negative for chest pain and leg swelling.  Gastrointestinal: Negative for nausea, vomiting and abdominal pain.  Endocrine: Negative for polyphagia.  Musculoskeletal: Negative for gait problem and myalgias.      BP 149/76  Pulse 89  Temp(Src) 98.9 F (37.2 C) (Oral)  Resp 16  Ht 5' 2.25" (1.581 m)  Wt 183 lb 12.8 oz (83.371 kg)  BMI 33.35 kg/m2  SpO2 97%      Objective:   Physical Exam  Nursing note and vitals reviewed. Constitutional: She is oriented to person, place, and time. She appears well-developed and well-nourished. No distress.  HENT:  Head: Normocephalic and atraumatic.  Eyes: Conjunctivae and EOM are normal.  Neck: Neck supple. No tracheal deviation present. No thyromegaly present.  Cardiovascular: Normal rate, regular rhythm, S1 normal and S2 normal.   No murmur heard. Pulmonary/Chest: Effort normal and breath sounds normal. No respiratory distress. She has no decreased breath sounds. She has no wheezes. She has no rhonchi.  Musculoskeletal: Normal range of motion.  Lymphadenopathy:    She has no cervical adenopathy.  Neurological: She is alert and oriented to person, place, and time.  Skin: Skin is warm and dry.  Psychiatric: She has a normal mood and affect. Her behavior is normal.   Results for orders placed in visit on 05/09/14  GLUCOSE, POCT (MANUAL RESULT ENTRY)      Result Value Ref Range   POC Glucose 117 (*) 70 - 99 mg/dl  POCT GLYCOSYLATED HEMOGLOBIN (HGB A1C)      Result Value Ref Range   Hemoglobin A1C 5.7    POCT URINALYSIS DIPSTICK      Result Value Ref Range   Color, UA yellow     Clarity, UA clear     Glucose, UA neg     Bilirubin, UA neg     Ketones, UA neg     Spec Grav, UA 1.010     Blood, UA neg     pH, UA 5.5     Protein, UA neg     Urobilinogen, UA 0.2     Nitrite, UA neg     Leukocytes, UA Negative         Assessment &  Plan:   Type II or unspecified type diabetes mellitus without mention of complication, uncontrolled - Plan: POCT glucose (manual entry), POCT glycosylated hemoglobin (Hb A1C), POCT urinalysis dipstick, HM Diabetes Foot Exam - Doing REALLY well!!!  Has been working hard on  diet and a1c dec from 6.3-> 5.7!!!! No side effects to the med so would like to cont on same dose for now, no hypoglycemia. If a1c cont to remain stable at next OV in 6 mos, rec trying to transition to metformin alone  Need for shingles vaccine - Plan: zoster vaccine live, PF, (ZOSTAVAX) 07121 UNT/0.65ML injection  Essential hypertension, benign - Plan: sitaGLIPtin-metformin (JANUMET) 50-1000 MG per tablet, TSH, Microalbumin, urine, Basic metabolic panel - increase valsartan from 80 to 160 - cont checking bp at home.  Other and unspecified hyperlipidemia - goal LDL <70 followed by cardiology on lipitor, zetia, coenzyme q10, and l-arginine. Has f/u w/ cards in 4 mos  Encounter for long-term (current) use of other medications - Plan: sitaGLIPtin-metformin (JANUMET) 50-1000 MG per tablet, TSH, Microalbumin, urine, Basic metabolic panel  Rec sched CPE and DM recheck in 6 mos - does not need to be fasting since cardiology does her lipid panel  Meds ordered this encounter  Medications  . zoster vaccine live, PF, (ZOSTAVAX) 97588 UNT/0.65ML injection    Sig: Inject 19,400 Units into the skin once.    Dispense:  1 each    Refill:  0  . sitaGLIPtin-metformin (JANUMET) 50-1000 MG per tablet    Sig: Take 1 tablet by mouth 2 (two) times daily with a meal.    Dispense:  180 tablet    Refill:  1  . valsartan (DIOVAN) 160 MG tablet    Sig: TAKE 1 TABLET BY MOUTH DAILY    Dispense:  90 tablet    Refill:  1    **Patient requests 90 days supply**    I personally performed the services described in this documentation, which was scribed in my presence. The recorded information has been reviewed and considered, and addended by me as  needed.  Delman Cheadle, MD MPH

## 2014-05-10 LAB — TSH: TSH: 3.854 u[IU]/mL (ref 0.350–4.500)

## 2014-05-10 LAB — MICROALBUMIN, URINE: Microalb, Ur: 0.69 mg/dL (ref 0.00–1.89)

## 2014-06-25 ENCOUNTER — Telehealth: Payer: Self-pay | Admitting: *Deleted

## 2014-06-25 NOTE — Telephone Encounter (Signed)
Left message to schedule appointment with Dr Brigitte Pulse to follow up diabetes. If appointment is schedule come at the date/time.

## 2014-06-28 ENCOUNTER — Other Ambulatory Visit: Payer: Self-pay | Admitting: Interventional Cardiology

## 2014-08-07 ENCOUNTER — Telehealth: Payer: Self-pay

## 2014-08-07 NOTE — Telephone Encounter (Signed)
Dr Brigitte Pulse    Patient is requesting an order of A1C and other labs as you wish to order for her.  Planning on coming in either Friday or Saturday morning.   [phone]

## 2014-08-29 ENCOUNTER — Other Ambulatory Visit (HOSPITAL_COMMUNITY): Payer: Self-pay | Admitting: Interventional Cardiology

## 2014-09-01 ENCOUNTER — Other Ambulatory Visit: Payer: Self-pay | Admitting: Interventional Cardiology

## 2014-09-24 ENCOUNTER — Other Ambulatory Visit: Payer: Self-pay | Admitting: Interventional Cardiology

## 2014-09-29 ENCOUNTER — Other Ambulatory Visit: Payer: Self-pay | Admitting: Interventional Cardiology

## 2014-10-03 ENCOUNTER — Other Ambulatory Visit: Payer: Self-pay | Admitting: Interventional Cardiology

## 2014-10-04 NOTE — Telephone Encounter (Signed)
Rx was sent to pharmacy electronically. 

## 2014-10-09 ENCOUNTER — Encounter (HOSPITAL_COMMUNITY): Payer: Self-pay | Admitting: Cardiology

## 2014-10-28 ENCOUNTER — Other Ambulatory Visit: Payer: Self-pay | Admitting: Interventional Cardiology

## 2014-10-28 ENCOUNTER — Other Ambulatory Visit (INDEPENDENT_AMBULATORY_CARE_PROVIDER_SITE_OTHER): Payer: BC Managed Care – PPO | Admitting: *Deleted

## 2014-10-28 DIAGNOSIS — E785 Hyperlipidemia, unspecified: Secondary | ICD-10-CM

## 2014-10-28 LAB — HEPATIC FUNCTION PANEL
ALBUMIN: 4.1 g/dL (ref 3.5–5.2)
ALK PHOS: 71 U/L (ref 39–117)
ALT: 20 U/L (ref 0–35)
AST: 19 U/L (ref 0–37)
Bilirubin, Direct: 0 mg/dL (ref 0.0–0.3)
TOTAL PROTEIN: 6.9 g/dL (ref 6.0–8.3)
Total Bilirubin: 0.4 mg/dL (ref 0.2–1.2)

## 2014-10-30 LAB — NMR LIPOPROFILE WITH LIPIDS
Cholesterol, Total: 127 mg/dL (ref 100–199)
HDL PARTICLE NUMBER: 25.5 umol/L — AB (ref >=30.5)
HDL SIZE: 8.1 nm — AB (ref >=9.2)
HDL-C: 30 mg/dL — ABNORMAL LOW (ref >39)
LDL CALC: 59 mg/dL (ref 0–99)
LDL Particle Number: 990 nmol/L (ref <1000)
LDL SIZE: 20.3 nm (ref >=20.8)
LP-IR SCORE: 100 — AB (ref <=45)
Large HDL-P: <1.3 umol/L — ABNORMAL LOW (ref >=4.8)
Large VLDL-P: 12.2 nmol/L — ABNORMAL HIGH (ref <=2.7)
Small LDL Particle Number: 627 nmol/L — ABNORMAL HIGH (ref <=527)
Triglycerides: 192 mg/dL — ABNORMAL HIGH (ref 0–149)
VLDL Size: 64.7 nm — ABNORMAL HIGH (ref <=46.6)

## 2014-11-04 ENCOUNTER — Encounter: Payer: Self-pay | Admitting: Interventional Cardiology

## 2014-11-04 ENCOUNTER — Ambulatory Visit (INDEPENDENT_AMBULATORY_CARE_PROVIDER_SITE_OTHER): Payer: BC Managed Care – PPO | Admitting: Interventional Cardiology

## 2014-11-04 VITALS — BP 110/58 | HR 88 | Ht 62.0 in | Wt 186.0 lb

## 2014-11-04 DIAGNOSIS — I2510 Atherosclerotic heart disease of native coronary artery without angina pectoris: Secondary | ICD-10-CM

## 2014-11-04 DIAGNOSIS — E785 Hyperlipidemia, unspecified: Secondary | ICD-10-CM

## 2014-11-04 DIAGNOSIS — E669 Obesity, unspecified: Secondary | ICD-10-CM

## 2014-11-04 DIAGNOSIS — I10 Essential (primary) hypertension: Secondary | ICD-10-CM

## 2014-11-04 NOTE — Progress Notes (Signed)
Patient ID: Suzanne Watkins, female   DOB: 11/23/1950, 64 y.o.   MRN: [id number] Patient ID: Suzanne Watkins, female   DOB: 1950/11/16, 64 y.o.   MRN: [id number] Patient ID: Suzanne Watkins, female   DOB: [birth year], 64 y.o.   MRN: [id number]    [address], [address], [place]  [address] Phone: [phone] Fax:  [phone]  Date:  11/04/2014   ID:  Lorel Lembo, DOB February 26, 1951, MRN [id number]  PCP:  Roselee Culver, MD      History of Present Illness: Rockie Vawter is a 64 y.o. female who has stents in her RCA and circumflex.  She had more angina.  She had another RCA stent and an LAD stent or [place] in 11/14.  Walking limited by hip. Since then: CAD/ASCVD:  Denies : Chest pain.  Dizziness.  Leg edema.  Nitroglycerin.  Orthopnea.  Palpitations.  Paroxysmal nocturnal dyspnea.  Syncope.  walking some for exercise. uses stairs. no angina with that.  No sx like she had prior to PCIs in the past.    Wt Readings from Last 3 Encounters:  11/04/14 186 lb (84.369 kg)  05/09/14 183 lb 12.8 oz (83.371 kg)  03/18/14 187 lb (84.823 kg)     Past Medical History  Diagnosis Date  . Hypertension   . Hyperlipidemia   . GERD (gastroesophageal reflux disease)   . Obesity   . Coronary atherosclerosis of native coronary artery 09/04/2013    RCA and obtuse marginal stent/DES-2010  . Myocardial infarction 07/2009    pt states that it was a minor heart attack, w/ no damage  . Shortness of breath     "just related to angina I was having recently" (09/04/2013)  . Type II diabetes mellitus   . Arthritis     "probably in my hands" (09/04/2013)  . Fibromyalgia     "dx'd in 1994"  . Cervical cancer 1986  . 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 aspirin 81 MG chewable tablet Chew 1 tablet (81 mg total) by mouth daily.    Marland Kitchen atorvastatin (LIPITOR) 80 MG tablet TAKE 1 TABLET BY MOUTH DAILY 90 tablet 0  . BRILINTA  90 MG TABS tablet TAKE 1 TABLET BY MOUTH TWICE DAILY 60 tablet 0  . diphenhydrAMINE (BENADRYL) 50 MG capsule Take 100 mg by mouth at bedtime as needed for sleep.    . metoprolol succinate (TOPROL-XL) 25 MG 24 hr tablet TAKE 1 TABLET BY MOUTH DAILY 90 tablet 1  . Multiple Vitamin (MULTIVITAMIN WITH MINERALS) TABS tablet Take 1 tablet by mouth daily.    . nitroGLYCERIN (NITROSTAT) 0.4 MG SL tablet Place 1 tablet (0.4 mg total) under the tongue every 5 (five) minutes as needed for chest pain. 25 tablet 12  . omeprazole (PRILOSEC) 20 MG capsule Take 20 mg by mouth daily.    . Psyllium (METAMUCIL) 30.9 % POWD 1 tablespoon with water or juice daily    . sitaGLIPtin-metformin (JANUMET) 50-1000 MG per tablet Take 1 tablet by mouth 2 (two) times daily with a meal. 180 tablet 1  . valsartan (DIOVAN) 160 MG tablet TAKE 1 TABLET BY MOUTH DAILY 90 tablet 1  . ZETIA 10 MG tablet TAKE 1 TABLET BY MOUTH EVERY DAY 30 tablet 3  . zoster vaccine live, PF, (ZOSTAVAX) 43329 UNT/0.65ML injection Inject 19,400 Units into the skin once. 1 each 0   No current facility-administered medications for this visit.  Allergies:    Allergies  Allergen Reactions  . Plavix [Clopidogrel Bisulfate] Other (See Comments)    Stomach upset    Social History:  The patient  reports that she quit smoking about 8 years ago. Her smoking use included Cigarettes. She has a 29 pack-year smoking history. She has never used smokeless tobacco. She reports that she does not drink alcohol or use illicit drugs.   Family History:  The patient's family history includes Cancer in her father; Diabetes in her daughter; Drug abuse in her brother and daughter; Heart disease in her daughter, mother, and paternal grandmother; Hyperlipidemia in her brother and mother; Hypertension in her brother; Lupus in her daughter; Stroke in her paternal grandfather.   ROS:  Please see the history of present illness.  No nausea, vomiting.  No fevers, chills.  No  focal weakness.  No dysuria.  Angina resolved after most recent stents in October 2014. She is not planning on doing cardiac rehabilitation.  All other systems reviewed and negative.   PHYSICAL EXAM: VS:  BP 110/58 mmHg  Pulse 88  Ht 5\' 2"  (1.575 m)  Wt 186 lb (84.369 kg)  BMI 34.01 kg/m2 Well nourished, well developed, in no acute distress HEENT: normal Neck: no JVD, no carotid bruits Cardiac:  normal S1, S2; RRR;  Lungs:  clear to auscultation bilaterally, no wheezing, rhonchi or rales Abd: soft, nontender, no hepatomegaly Ext: no edema, Skin: warm and dry Neuro:   no focal abnormalities noted Psych: normal affect       ASSESSMENT AND PLAN:  Coronary atherosclerosis of native coronary artery  Continue Brilinta Tablet, 90 MG, 1 tablet, Orally, twice a day, (maintanence aspirin should be less than 100 mg when used with Brilinta), 60  Aspirin EC Lo-Dose Tablet Delayed Release, 81 MG, 1 tablet, Orally, Once a day, 30 day(s), 30 Refill Nitroglycerin 0.4 mg tablet, 0.4 mg, 1 tablet as directed, SL, as directed prn chest pain, 1 vial, Refills 6   Notes: No angina. Old Stents are nearly 62 years old. Back on  Brilinta due to more recent stents in 11/14. She had stomach upset with Plavix. Consider changing to low dose Brilinta next year.  2. Essential hypertension, benign  Continue Diovan Tablet, 80 MG, 1 tablet, Orally, Once a day Continue Metoprolol Succinate Tablet Extended Release 24 Hour, 25 MG, 1 tablet, Orally, Once a day BP in the 465K and below systolic at home.  3. Mixed hyperlipidemia  Continue Atorvastatin Calcium Tablet, 80 MG, 1 tablet, Orally, Once a day, 30 day(s), 30, Refills 11  Notes: LDL controlled in12/29/15. LDL 85; HDL 25, TG 185 in 10/14.; 2/15: LDL 52; TG 139.  Better on Zetia. 10/28/14: LDL 59; TG 192 which is higher than prior-likely related to holiday eating. 4. Obesity, unspecified  Notes: She has maintatined weight loss with lifestyle modifications. Look  for exercises that she can do that do not irritate her hip.  She does not do any dedicated exercise.  SHe just tries to be active.  Encouraged 150 minutes of exercise /week.     Signed, Mina Marble, MD, [facility] 11/04/2014 4:40 PM

## 2014-11-04 NOTE — Patient Instructions (Signed)
Your physician wants you to follow-up in: 9 months You will receive a reminder letter in the mail two months in advance. If you don't receive a letter, please call our office to schedule the follow-up appointment.  

## 2014-11-14 ENCOUNTER — Ambulatory Visit (INDEPENDENT_AMBULATORY_CARE_PROVIDER_SITE_OTHER): Payer: BC Managed Care – PPO | Admitting: Family Medicine

## 2014-11-14 ENCOUNTER — Encounter: Payer: Self-pay | Admitting: Family Medicine

## 2014-11-14 VITALS — BP 155/84 | HR 79 | Temp 98.8°F | Resp 16 | Ht 62.5 in | Wt 186.4 lb

## 2014-11-14 DIAGNOSIS — E785 Hyperlipidemia, unspecified: Secondary | ICD-10-CM

## 2014-11-14 DIAGNOSIS — I25119 Atherosclerotic heart disease of native coronary artery with unspecified angina pectoris: Secondary | ICD-10-CM

## 2014-11-14 DIAGNOSIS — I10 Essential (primary) hypertension: Secondary | ICD-10-CM

## 2014-11-14 DIAGNOSIS — E1165 Type 2 diabetes mellitus with hyperglycemia: Secondary | ICD-10-CM

## 2014-11-14 LAB — POCT GLYCOSYLATED HEMOGLOBIN (HGB A1C): Hemoglobin A1C: 5.9

## 2014-11-14 LAB — GLUCOSE, POCT (MANUAL RESULT ENTRY): POC Glucose: 99 mg/dl (ref 70–99)

## 2014-11-14 MED ORDER — VALSARTAN 160 MG PO TABS: ORAL_TABLET | ORAL | Status: DC

## 2014-11-14 NOTE — Progress Notes (Addendum)
Subjective:    Patient ID: Suzanne Watkins, female    DOB: 1951-01-28, 64 y.o.   MRN: [id number]  This chart was scribed for Shawnee Knapp, MD by Stephania Fragmin, ED Scribe. This patient was seen in room 23 and the patient's care was started at 5:39 PM.   Chief Complaint  Patient presents with  . Diabetes  . Medication Refill    Diabetes Pertinent negatives for hypoglycemia include no dizziness. Pertinent negatives for diabetes include no fatigue and no weakness.    HPI Comments: Suzanne Watkins is a 64 y.o. female who presents to the [facility] center for medication refill and DM recheck.    Her systolic blood pressure is usually 136-146, taken in the afternoon after work.   Patient reports some shakiness, if she doesn't eat regularly. The lowest blood sugar reading she has taken has been at 81. She takes her blood sugar several times a day, and sometimes if it feels low.   She sees her cardiologist every 6-9 months. Patient's mother died of an MI when patient was 62.      Review of Systems  Constitutional: Negative for fever, chills, appetite change and fatigue.  HENT: Negative for congestion.   Respiratory: Negative for cough.   Neurological: Negative for dizziness and weakness.     Past Medical History  Diagnosis Date  . Hypertension   . Hyperlipidemia   . GERD (gastroesophageal reflux disease)   . Obesity   . Coronary atherosclerosis of native coronary artery 09/04/2013    RCA and obtuse marginal stent/DES-2010  . Myocardial infarction 07/2009    pt states that it was a minor heart attack, w/ no damage  . Shortness of breath     "just related to angina I was having recently" (09/04/2013)  . Type II diabetes mellitus   . Arthritis     "probably in my hands" (09/04/2013)  . Fibromyalgia     "dx'd in 1994"  . Cervical cancer 1986  . 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 aspirin 81 MG chewable tablet Chew 1 tablet (81 mg total) by mouth daily.    Marland Kitchen atorvastatin (LIPITOR) 80 MG tablet TAKE 1 TABLET BY MOUTH DAILY 90 tablet 0  . BRILINTA 90 MG TABS tablet TAKE 1 TABLET BY MOUTH TWICE DAILY 60 tablet 0  . diphenhydrAMINE (BENADRYL) 50 MG capsule Take 100 mg by mouth at bedtime as needed for sleep.    . metoprolol succinate (TOPROL-XL) 25 MG 24 hr tablet TAKE 1 TABLET BY MOUTH DAILY 90 tablet 1  . Multiple Vitamin (MULTIVITAMIN WITH MINERALS) TABS tablet Take 1 tablet by mouth daily.    . nitroGLYCERIN (NITROSTAT) 0.4 MG SL tablet Place 1 tablet (0.4 mg total) under the tongue every 5 (five) minutes as needed for chest pain. 25 tablet 12  . omeprazole (PRILOSEC) 20 MG capsule Take 20 mg by mouth daily.    . Psyllium (METAMUCIL) 30.9 % POWD 1 tablespoon with water or juice daily    . sitaGLIPtin-metformin (JANUMET) 50-1000 MG per tablet Take 1 tablet by mouth 2 (two) times daily with a meal. 180 tablet 1  . ZETIA 10 MG tablet TAKE 1 TABLET BY MOUTH EVERY DAY 30 tablet 3  . zoster vaccine live, PF, (ZOSTAVAX) 79892 UNT/0.65ML injection Inject 19,400 Units into the skin once. (Patient not taking: Reported on 11/14/2014) 1 each 0  No current facility-administered medications on file prior to visit.       Objective:   BP 155/84 mmHg  Pulse 79  Temp(Src) 98.8 F (37.1 C) (Oral)  Resp 16  Ht 5' 2.5" (1.588 m)  Wt 186 lb 6.4 oz (84.55 kg)  BMI 33.53 kg/m2  SpO2 99%    Physical Exam  Constitutional: She is oriented to person, place, and time. She appears well-developed and well-nourished. No distress.  HENT:  Head: Normocephalic and atraumatic.  Eyes: Conjunctivae and EOM are normal.  Neck: Neck supple. No tracheal deviation present.  Cardiovascular: Normal rate.   Pulmonary/Chest: Effort normal. No respiratory distress.  Musculoskeletal: Normal range of motion.  Neurological: She is alert and oriented to person, place, and time.  Skin:  Skin is warm and dry.  Psychiatric: She has a normal mood and affect. Her behavior is normal.  Nursing note and vitals reviewed.  Blood pressure reading, manually taken on right arm with large cuff:  110/60       Assessment & Plan:   5:53 PM-Discussed treatment plan which includes medication refill and follow-up in 6 months, with pt and pt agreed to plan.  Essential hypertension, benign - Plan: BASIC METABOLIC PANEL WITH GFR - valsartan refilled, cont metoprolol - white coat HTN and elevates with automatic cuff - pt monitoring BP at home and at goal.   Type 2 diabetes mellitus with hyperglycemia - Plan: HM Diabetes Foot Exam, POCT glycosylated hemoglobin (Hb A1C), POCT glucose (manual entry), Microalbumin, urine - Janumet refilled.  hbga1c excellent at 5.9 w/o hypoglycemia.  Coronary artery disease - Followed every 6 months by Dr. Irish Lack due to mult stents - they follow her choleterol with NMR lipoprofile and LFTs 2 wks ago - on brilinta w/ prn nitroglycerin  HPL - followed by Dr. Irish Lack on lipitor 80 and zetia 10  Meds ordered this encounter  Medications  . valsartan (DIOVAN) 160 MG tablet    Sig: TAKE 1 TABLET BY MOUTH DAILY    Dispense:  90 tablet    Refill:  1    **Patient requests 90 days supply**    I personally performed the services described in this documentation, which was scribed in my presence. The recorded information has been reviewed and considered, and addended by me as needed.  Delman Cheadle, MD MPH  Results for orders placed or performed in visit on 99/35/70  BASIC METABOLIC PANEL WITH GFR  Result Value Ref Range   Sodium 138 135 - 145 mEq/L   Potassium 4.7 3.5 - 5.3 mEq/L   Chloride 101 96 - 112 mEq/L   CO2 23 19 - 32 mEq/L   Glucose, Bld 87 70 - 99 mg/dL   BUN 21 6 - 23 mg/dL   Creat 0.74 0.50 - 1.10 mg/dL   Calcium 10.3 8.4 - 10.5 mg/dL   GFR, Est African American >89 mL/min   GFR, Est Non African American 88 mL/min  Microalbumin, urine  Result Value  Ref Range   Microalb, Ur 0.4 <2.0 mg/dL  POCT glycosylated hemoglobin (Hb A1C)  Result Value Ref Range   Hemoglobin A1C 5.9   POCT glucose (manual entry)  Result Value Ref Range   POC Glucose 99 70 - 99 mg/dl

## 2014-11-15 LAB — BASIC METABOLIC PANEL WITH GFR
BUN: 21 mg/dL (ref 6–23)
CHLORIDE: 101 meq/L (ref 96–112)
CO2: 23 mEq/L (ref 19–32)
Calcium: 10.3 mg/dL (ref 8.4–10.5)
Creat: 0.74 mg/dL (ref 0.50–1.10)
GFR, EST NON AFRICAN AMERICAN: 88 mL/min
GFR, Est African American: >89 mL/min
Glucose, Bld: 87 mg/dL (ref 70–99)
POTASSIUM: 4.7 meq/L (ref 3.5–5.3)
SODIUM: 138 meq/L (ref 135–145)

## 2014-11-16 ENCOUNTER — Other Ambulatory Visit: Payer: Self-pay | Admitting: Family Medicine

## 2014-11-16 LAB — MICROALBUMIN, URINE: MICROALB UR: 0.4 mg/dL (ref <2.0)

## 2014-11-17 ENCOUNTER — Encounter: Payer: Self-pay | Admitting: Family Medicine

## 2014-11-22 ENCOUNTER — Other Ambulatory Visit: Payer: Self-pay | Admitting: Family Medicine

## 2014-11-22 MED ORDER — SITAGLIPTIN PHOS-METFORMIN HCL 50-1000 MG PO TABS: ORAL_TABLET | ORAL | Status: DC

## 2014-11-28 ENCOUNTER — Other Ambulatory Visit: Payer: Self-pay | Admitting: Interventional Cardiology

## 2014-12-31 ENCOUNTER — Other Ambulatory Visit: Payer: Self-pay | Admitting: Interventional Cardiology

## 2015-03-19 ENCOUNTER — Encounter: Payer: Self-pay | Admitting: Family Medicine

## 2015-03-20 ENCOUNTER — Other Ambulatory Visit: Payer: Self-pay

## 2015-03-20 MED ORDER — GLUCOSE BLOOD VI STRP: ORAL_STRIP | Status: DC

## 2015-04-03 ENCOUNTER — Other Ambulatory Visit: Payer: Self-pay | Admitting: Interventional Cardiology

## 2015-04-03 ENCOUNTER — Encounter: Payer: Self-pay | Admitting: *Deleted

## 2015-05-29 ENCOUNTER — Other Ambulatory Visit: Payer: Self-pay | Admitting: Family Medicine

## 2015-07-08 ENCOUNTER — Other Ambulatory Visit: Payer: Self-pay | Admitting: Interventional Cardiology

## 2015-08-11 ENCOUNTER — Telehealth: Payer: Self-pay | Admitting: Family Medicine

## 2015-08-11 NOTE — Telephone Encounter (Signed)
Left a message for patient to call and schedule appointment for diabetes and hypertension follow up.  Also needs a mammogram

## 2015-08-18 ENCOUNTER — Encounter: Payer: Self-pay | Admitting: Family Medicine

## 2015-08-20 ENCOUNTER — Ambulatory Visit (INDEPENDENT_AMBULATORY_CARE_PROVIDER_SITE_OTHER): Payer: BC Managed Care – PPO | Admitting: Family Medicine

## 2015-08-20 VITALS — BP 140/80 | HR 86 | Temp 99.4°F | Resp 18 | Ht 63.0 in | Wt 183.2 lb

## 2015-08-20 DIAGNOSIS — E1142 Type 2 diabetes mellitus with diabetic polyneuropathy: Secondary | ICD-10-CM | POA: Diagnosis not present

## 2015-08-20 DIAGNOSIS — Z283 Underimmunization status: Secondary | ICD-10-CM

## 2015-08-20 DIAGNOSIS — I10 Essential (primary) hypertension: Secondary | ICD-10-CM

## 2015-08-20 DIAGNOSIS — Z23 Encounter for immunization: Secondary | ICD-10-CM

## 2015-08-20 DIAGNOSIS — E785 Hyperlipidemia, unspecified: Secondary | ICD-10-CM

## 2015-08-20 DIAGNOSIS — Z2839 Other underimmunization status: Secondary | ICD-10-CM

## 2015-08-20 LAB — LIPID PANEL
CHOL/HDL RATIO: 3.7 ratio (ref <=5.0)
Cholesterol: 140 mg/dL (ref 125–200)
HDL: 38 mg/dL — AB (ref >=46)
LDL Cholesterol: 73 mg/dL (ref <130)
Triglycerides: 146 mg/dL (ref <150)
VLDL: 29 mg/dL (ref <30)

## 2015-08-20 LAB — COMPLETE METABOLIC PANEL WITH GFR
ALT: 24 U/L (ref 6–29)
AST: 21 U/L (ref 10–35)
Albumin: 4.4 g/dL (ref 3.6–5.1)
Alkaline Phosphatase: 79 U/L (ref 33–130)
BUN: 15 mg/dL (ref 7–25)
CHLORIDE: 107 mmol/L (ref 98–110)
CO2: 20 mmol/L (ref 20–31)
Calcium: 10 mg/dL (ref 8.6–10.4)
Creat: 0.68 mg/dL (ref 0.50–0.99)
GFR, Est African American: >89 mL/min (ref >=60)
GLUCOSE: 149 mg/dL — AB (ref 65–99)
POTASSIUM: 4.6 mmol/L (ref 3.5–5.3)
SODIUM: 140 mmol/L (ref 135–146)
Total Bilirubin: 0.4 mg/dL (ref 0.2–1.2)
Total Protein: 7.2 g/dL (ref 6.1–8.1)

## 2015-08-20 LAB — POCT GLYCOSYLATED HEMOGLOBIN (HGB A1C): Hemoglobin A1C: 5.5

## 2015-08-20 LAB — GLUCOSE, POCT (MANUAL RESULT ENTRY): POC Glucose: 159 mg/dl — AB (ref 70–99)

## 2015-08-20 LAB — HEMOGLOBIN A1C: Hgb A1c MFr Bld: 5.5 % (ref 4.0–6.0)

## 2015-08-20 MED ORDER — VALSARTAN 160 MG PO TABS: 160.0000 mg | ORAL_TABLET | Freq: Every day | ORAL | Status: DC

## 2015-08-20 MED ORDER — GLUCOSE BLOOD VI STRP: ORAL_STRIP | Status: DC

## 2015-08-20 MED ORDER — SITAGLIPTIN PHOS-METFORMIN HCL 50-1000 MG PO TABS: ORAL_TABLET | ORAL | Status: DC

## 2015-08-20 NOTE — Progress Notes (Signed)
Pathology results Patient ID: Suzanne Watkins, female    DOB: 08/14/1951  Age: 64 y.o. MRN: [id number]  Chief Complaint  Patient presents with  . Medication Refill    valsartan 160mg  and janumet 50-1000mg     Subjective:   64 year old lady who is here for her diabetic check and refill of medications as well as her blood pressure medication. She is doing well. She usually sees Dr. Brigitte Pulse. She has a history of coronary artery disease stenting, and is getting regular exercise About over 10,000 steps per day. She has no major acute health complaints. She takes her medications faithfully. She checks his sugars faithfully. Does not have much of a history of getting respiratory illnesses, but she is interested in going ahead and getting the flu shot and also needs a Pneumovax.  Her daughter who is a Software engineer recommends various things to her.  Current allergies, medications, problem list, past/family and social histories reviewed.  Objective:  BP 140/80 mmHg  Pulse 86  Temp(Src) 99.4 F (37.4 C) (Oral)  Resp 18  Ht 5\' 3"  (1.6 m)  Wt 183 lb 3.2 oz (83.099 kg)  BMI 32.46 kg/m2  SpO2 98%  No acute distress. Neck supple without nodes or thyromegaly. Chest clear. Heart regular without murmurs. No CVA tenderness. Abdomen soft without mass or tenderness. Feet are grossly normal but has poor sensation on filament testing up to the ankle. Hands test normally.  Assessment & Plan:   Assessment: No diagnosis found.    Plan: Refill her diabetes and blood pressure medications. Check labs. Give her a flu and Pneumovax.    Results for orders placed or performed in visit on 08/20/15  POCT glucose (manual entry)  Result Value Ref Range   POC Glucose 159 (A) 70 - 99 mg/dl  POCT glycosylated hemoglobin (Hb A1C)  Result Value Ref Range   Hemoglobin A1C 5.5         There are no Patient Instructions on file for this visit.   No Follow-up on file.   Ameisha Mcclellan, MD 08/20/2015

## 2015-08-20 NOTE — Patient Instructions (Addendum)
Continue to carefully monitor glucose and get regular exercise.  Continue same medications except reduce the Janumet to one pill daily.    Some of the labs will not be back for a few days and we will let you know the results of them.  You have received the Pneumovax and influenza vaccine today.  Feel free to return at anytime in between visits if problems arise

## 2015-08-25 ENCOUNTER — Other Ambulatory Visit: Payer: Self-pay | Admitting: Family Medicine

## 2015-08-25 DIAGNOSIS — E1142 Type 2 diabetes mellitus with diabetic polyneuropathy: Secondary | ICD-10-CM

## 2015-08-25 DIAGNOSIS — I10 Essential (primary) hypertension: Secondary | ICD-10-CM

## 2015-08-25 MED ORDER — VALSARTAN 320 MG PO TABS: 320.0000 mg | ORAL_TABLET | Freq: Every day | ORAL | Status: DC

## 2015-08-25 MED ORDER — SITAGLIPTIN PHOS-METFORMIN HCL 50-1000 MG PO TABS: ORAL_TABLET | ORAL | Status: DC

## 2015-08-28 ENCOUNTER — Encounter: Payer: Self-pay | Admitting: Family Medicine

## 2015-09-02 ENCOUNTER — Other Ambulatory Visit: Payer: Self-pay | Admitting: Interventional Cardiology

## 2015-09-10 ENCOUNTER — Ambulatory Visit: Payer: BC Managed Care – PPO | Admitting: Family Medicine

## 2015-10-02 ENCOUNTER — Other Ambulatory Visit: Payer: Self-pay | Admitting: Interventional Cardiology

## 2015-10-02 ENCOUNTER — Other Ambulatory Visit: Payer: Self-pay | Admitting: Family Medicine

## 2015-10-04 ENCOUNTER — Encounter: Payer: Self-pay | Admitting: Interventional Cardiology

## 2015-10-22 ENCOUNTER — Telehealth: Payer: Self-pay | Admitting: Interventional Cardiology

## 2015-10-22 NOTE — Telephone Encounter (Signed)
Will forward to Dr. Irish Lack and his nurse Jeani Hawking.

## 2015-10-22 NOTE — Telephone Encounter (Signed)
New message      Pt has an annual follow up appt scheduled for 12-07-15.  She want to know if she will need to come in early for blood work.  Please send a message thru mychart and let her know if she needs to come in early for labs.

## 2015-10-28 ENCOUNTER — Other Ambulatory Visit: Payer: Self-pay | Admitting: Interventional Cardiology

## 2015-10-28 NOTE — Telephone Encounter (Signed)
Lipid, CMET and Hgb A1C done 08/20/15 by Windell Hummingbird, PA-C. (In [facility])

## 2015-11-04 NOTE — Telephone Encounter (Signed)
The pt is advised that she had labs drawn on 08/20/15 so she will not need to have any lab work done prior to her f/u with Dr Irish Lack. She verbalized understanding and thanked me for calling her back.

## 2015-11-04 NOTE — Telephone Encounter (Signed)
LMTCB

## 2015-11-29 ENCOUNTER — Other Ambulatory Visit: Payer: Self-pay | Admitting: Interventional Cardiology

## 2015-12-07 ENCOUNTER — Ambulatory Visit (INDEPENDENT_AMBULATORY_CARE_PROVIDER_SITE_OTHER): Payer: BC Managed Care – PPO | Admitting: Interventional Cardiology

## 2015-12-07 ENCOUNTER — Encounter: Payer: Self-pay | Admitting: Interventional Cardiology

## 2015-12-07 VITALS — BP 154/90 | HR 80 | Ht 63.0 in | Wt 179.8 lb

## 2015-12-07 DIAGNOSIS — E1159 Type 2 diabetes mellitus with other circulatory complications: Secondary | ICD-10-CM

## 2015-12-07 DIAGNOSIS — I10 Essential (primary) hypertension: Secondary | ICD-10-CM | POA: Diagnosis not present

## 2015-12-07 DIAGNOSIS — E785 Hyperlipidemia, unspecified: Secondary | ICD-10-CM

## 2015-12-07 DIAGNOSIS — I252 Old myocardial infarction: Secondary | ICD-10-CM

## 2015-12-07 DIAGNOSIS — I2510 Atherosclerotic heart disease of native coronary artery without angina pectoris: Secondary | ICD-10-CM | POA: Diagnosis not present

## 2015-12-07 NOTE — Progress Notes (Signed)
Patient ID: Suzanne Watkins, female   DOB: 09-24-1951, 65 y.o.   MRN: [id number]     Cardiology Office Note   Date:  12/07/2015   ID:  Suzanne Watkins, DOB [birth year], MRN [id number]  PCP:  Roselee Culver, MD    No chief complaint on file.  F/u CAD  Wt Readings from Last 3 Encounters:  12/07/15 179 lb 12.8 oz (81.557 kg)  08/20/15 183 lb 3.2 oz (83.099 kg)  11/14/14 186 lb 6.4 oz (84.55 kg)       History of Present Illness: Suzanne Watkins is a 65 y.o. female  Who has had CAD.  She has had stents in all three coronary vessels.  Most recent were in 11/14.  She had been walking a lot, up to 13,000 steps daily.  She uses her headphones and enjoys the walking.  She walks around campus.  She is walking faster.  No chest pain or SHOB.  No NTG.  No edema.  No sx like prior angina.    No bleeding problems. No other medication side effects.  Blood sugars well controlled.  Hypoglycemia at Christmas.    Past Medical History  Diagnosis Date  . Hypertension   . Hyperlipidemia   . GERD (gastroesophageal reflux disease)   . Obesity   . Coronary atherosclerosis of native coronary artery 09/04/2013    RCA and obtuse marginal stent/DES-2010  . Myocardial infarction ([place]) 07/2009    pt states that it was a minor heart attack, w/ no damage  . Shortness of breath     "just related to angina I was having recently" (09/04/2013)  . Type II diabetes mellitus ([place])   . Arthritis     "probably in my hands" (09/04/2013)  . Fibromyalgia     "dx'd in 1994"  . Cervical cancer ([place]) 1986  . 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 Cervical cone biopsy  03/1985  . Tubal ligation  1979  . Carpal tunnel release Right 07/1992  . Shoulder open rotator cuff repair Right 10/2004    "got 5 pins in" (09/04/2013)  . Coronary angioplasty with stent placement  07/2009, 08/2009; 09/04/2013    "1+1+2; total of 4" (09/04/2013)  . Mohs surgery Left 05/2008   "nostril"  . Left heart catheterization with coronary angiogram N/A 09/04/2013    Procedure: LEFT HEART CATHETERIZATION WITH CORONARY ANGIOGRAM;  Surgeon: Candee Furbish, MD;  Location: [facility] CATH LAB;  Service: Cardiovascular;  Laterality: N/A;     Current Outpatient Prescriptions  Medication Sig Dispense Refill  . aspirin 81 MG chewable tablet Chew 1 tablet (81 mg total) by mouth daily.    Marland Kitchen atorvastatin (LIPITOR) 80 MG tablet TAKE 1 TABLET BY MOUTH DAILY 30 tablet 6  . BRILINTA 90 MG TABS tablet TAKE 1 TABLET BY MOUTH TWICE DAILY 60 tablet 0  . diphenhydrAMINE (BENADRYL) 50 MG capsule Take 100 mg by mouth at bedtime as needed for sleep.    Marland Kitchen glucose blood test strip Use as instructed. One touch ultra meter. 100 each 5  . metoprolol succinate (TOPROL-XL) 25 MG 24 hr tablet TAKE 1 TABLET BY MOUTH DAILY 30 tablet 6  . Multiple Vitamin (MULTIVITAMIN WITH MINERALS) TABS tablet Take 1 tablet by mouth daily.    . nitroGLYCERIN (NITROSTAT) 0.4 MG SL tablet Place 1 tablet (0.4 mg total) under the tongue every 5 (five) minutes as needed for chest pain. 25 tablet 12  . omeprazole (PRILOSEC)  20 MG capsule Take 20 mg by mouth daily.    . ONE TOUCH ULTRA TEST test strip USE AS DIRECTED 100 each 3  . Psyllium (METAMUCIL) 30.9 % POWD 1 tablespoon with water or juice daily    . sitaGLIPtin-metformin (JANUMET) 50-1000 MG tablet TAKE 1 TABLET BY MOUTH once daily with breakfast 180 tablet 1  . valsartan (DIOVAN) 320 MG tablet Take 1 tablet (320 mg total) by mouth daily. PATIENT NEEDS OFFICE VISIT FOR ADDITIONAL REFILLS 90 tablet 1  . ZETIA 10 MG tablet TAKE 1 TABLET BY MOUTH EVERY DAY 30 tablet 5   No current facility-administered medications for this visit.    Allergies:   Plavix    Social History:  The patient  reports that she quit smoking about 9 years ago. Her smoking use included Cigarettes. She has a 29 pack-year smoking history. She has never used smokeless tobacco. She reports that she does not drink  alcohol or use illicit drugs.   Family History:  The patient's family history includes Cancer in her father; Diabetes in her daughter; Drug abuse in her brother and daughter; Heart disease in her daughter, mother, and paternal grandmother; Hyperlipidemia in her brother and mother; Hypertension in her brother; Lupus in her daughter; Stroke in her paternal grandfather.    ROS:  Please see the history of present illness.   Otherwise, review of systems are positive for one episode of low blood sugar.   All other systems are reviewed and negative.    PHYSICAL EXAM: VS:  BP 154/90 mmHg  Pulse 80  Ht 5\' 3"  (1.6 m)  Wt 179 lb 12.8 oz (81.557 kg)  BMI 31.86 kg/m2 , BMI Body mass index is 31.86 kg/(m^2). GEN: Well nourished, well developed, in no acute distress HEENT: normal Neck: no JVD, carotid bruits, or masses Cardiac: RRR; no murmurs, rubs, or gallops,no edema  Respiratory:  clear to auscultation bilaterally, normal work of breathing GI: soft, nontender, nondistended, + BS MS: no deformity or atrophy Skin: warm and dry, no rash Neuro:  Strength and sensation are intact Psych: euthymic mood, full affect   EKG:   The ekg ordered today demonstrates normal ECG   Recent Labs: 08/20/2015: ALT 24; BUN 15; Creat 0.68; Potassium 4.6; Sodium 140   Lipid Panel    Component Value Date/Time   CHOL 140 08/20/2015 1132   CHOL 127 10/28/2014 0801   TRIG 146 08/20/2015 1132   TRIG 192* 10/28/2014 0801   HDL 38* 08/20/2015 1132   HDL 30* 10/28/2014 0801   CHOLHDL 3.7 08/20/2015 1132   VLDL 29 08/20/2015 1132   LDLCALC 73 08/20/2015 1132   LDLCALC 59 10/28/2014 0801     Other studies Reviewed: Additional studies/ records that were reviewed today with results demonstrating: lipids as above; multiple DES.   ASSESSMENT AND PLAN:  1. CAD/old MI: No angina.  She needed Prilosec so she took Brilinta instead. Stop aspirin.  She prefers to stay on Brilinta rather than go back to Plavix,  although I think Pavix would be reasonable with PPI.  If there are bleeding issues, would switch to clopidogrel.  She is not alleergic to Plavix.  It was the potential interaction with prilosec and her inability to tolerate a different PPI that caused [place] to leave her on Brilinta. 2. DM: Well controlled.  A1C was less than 6 per her report. Watch for low blood sugar.  SHe sometimes holds one of the DM meds if sugar is low.  I explained  to there that too low of an A1C would put her at risk for hypoglycemia.   3. Hyperlipidemia: LDL controlled.   TG improved.  COntinue atorvastatin.  Will recheck labs in 10/17. 4. I encouraged her to continue to walk and lose weight. 5. HTN: Continue to monitor BP.  Should improve with exercise.   Current medicines are reviewed at length with the patient today.  The patient concerns regarding her medicines were addressed.  The following changes have been made:  No change  Labs/ tests ordered today include:  No orders of the defined types were placed in this encounter.    Recommend 150 minutes/week of aerobic exercise Low fat, low carb, high fiber diet recommended  Disposition:   FU in 1 year   Teresita Madura., MD  12/07/2015 11:11 AM    [facility] [address], [place], [place]  [address] Phone: [phone]; Fax: [phone]

## 2015-12-07 NOTE — Patient Instructions (Addendum)
Medication Instructions:  Your physician has recommended you make the following change in your medication:  1.  STOP the Aspriin   Labwork: 08/08/2016:  LIPID PANEL & CMET  Testing/Procedures: NONE ORDERED  Follow-Up: Your physician wants you to follow-up in: [place] DR. VARANASI.  You will receive a reminder letter in the mail two months in advance. If you don't receive a letter, please call our office to schedule the follow-up appointment.    Any Other Special Instructions Will Be Listed Below (If Applicable).   If you need a refill on your cardiac medications before your next appointment, please call your pharmacy.

## 2016-01-01 ENCOUNTER — Other Ambulatory Visit: Payer: Self-pay | Admitting: Interventional Cardiology

## 2016-02-03 ENCOUNTER — Ambulatory Visit (INDEPENDENT_AMBULATORY_CARE_PROVIDER_SITE_OTHER): Payer: BC Managed Care – PPO | Admitting: Family Medicine

## 2016-02-03 VITALS — BP 130/58 | HR 93 | Temp 100.1°F | Resp 16 | Ht 62.25 in | Wt 180.2 lb

## 2016-02-03 DIAGNOSIS — E1142 Type 2 diabetes mellitus with diabetic polyneuropathy: Secondary | ICD-10-CM

## 2016-02-03 DIAGNOSIS — N3000 Acute cystitis without hematuria: Secondary | ICD-10-CM

## 2016-02-03 DIAGNOSIS — M545 Low back pain, unspecified: Secondary | ICD-10-CM

## 2016-02-03 DIAGNOSIS — R509 Fever, unspecified: Secondary | ICD-10-CM

## 2016-02-03 LAB — POCT URINALYSIS DIP (MANUAL ENTRY)
BILIRUBIN UA: NEGATIVE
Glucose, UA: NEGATIVE
Ketones, POC UA: NEGATIVE
NITRITE UA: NEGATIVE
PH UA: 6
Protein Ur, POC: NEGATIVE
RBC UA: NEGATIVE
SPEC GRAV UA: 1.01
UROBILINOGEN UA: 0.2

## 2016-02-03 LAB — POCT CBC
Granulocyte percent: 79.5 %G (ref 37–80)
HEMATOCRIT: 35.1 % — AB (ref 37.7–47.9)
Hemoglobin: 12.5 g/dL (ref 12.2–16.2)
LYMPH, POC: 0.6 (ref 0.6–3.4)
MCH, POC: 32.2 pg — AB (ref 27–31.2)
MCHC: 35.5 g/dL — AB (ref 31.8–35.4)
MCV: 90.7 fL (ref 80–97)
MID (cbc): 0.6 (ref 0–0.9)
MPV: 8.1 fL (ref 0–99.8)
POC GRANULOCYTE: 4.7 (ref 2–6.9)
POC LYMPH %: 10.8 % (ref 10–50)
POC MID %: 9.7 %M (ref 0–12)
Platelet Count, POC: 185 10*3/uL (ref 142–424)
RBC: 3.87 M/uL — AB (ref 4.04–5.48)
RDW, POC: 12.9 %
WBC: 5.9 10*3/uL (ref 4.6–10.2)

## 2016-02-03 LAB — POC MICROSCOPIC URINALYSIS (UMFC)

## 2016-02-03 LAB — POCT INFLUENZA A/B
INFLUENZA A, POC: NEGATIVE
INFLUENZA B, POC: NEGATIVE

## 2016-02-03 MED ORDER — SITAGLIPTIN PHOS-METFORMIN HCL 50-1000 MG PO TABS: ORAL_TABLET | ORAL | Status: DC

## 2016-02-03 MED ORDER — CIPROFLOXACIN HCL 250 MG PO TABS: 250.0000 mg | ORAL_TABLET | Freq: Two times a day (BID) | ORAL | Status: DC

## 2016-02-03 NOTE — Progress Notes (Signed)
   HPI  Patient presents today here with fever and r sided low back pain  Pt explains she has similar pain to previous UTI/pyelonephritis She feels malaise and low back pain with colicky character and radiation to the R groin.  Started about 12-18 hours ago No abd pain and no dysuria  Pain described in area of R SI area  She is eating and drinking normally., She has no chest pain, cough, or dyspnea.  She has a Hx of CAD with 3 vessels stented, last in 11/14  PMH: Smoking status noted ROS: Per HPI  Objective: BP 130/58 mmHg  Pulse 93  Temp(Src) 100.1 F (37.8 C) (Oral)  Resp 16  Ht 5' 2.25" (1.581 m)  Wt 180 lb 3.2 oz (81.738 kg)  BMI 32.70 kg/m2  SpO2 98% Gen: NAD, alert, cooperative with exam HEENT: NCAT CV: RRR, good S1/S2, no murmur Resp: CTABL, no wheezes, non-labored Abd: softm, mild tenderness to palp in BL LQ, no suprapubic pain, + BS, NO guarding, No CVA tenderness Ext: No edema, warm Neuro: Alert and oriented, No gross deficits MSK: Tenderness to palp of R SI joint,   Recent Labs Lab 02/03/16 1550  HGB 12.5  HCT 35.1*  WBC 5.9      Assessment and plan:  # Fever, UTI With mild abd pain, fever, and findings on UA I am treating UTI Culture to cobfirm Discussed with patient that her UA is not completely convincing but given the circumstance treatment ios likely the right answer WBC WNL   # Back pain MSK back pain consitent with SI dysfunction With fever and bony back pain I did worry about diskitis and other serious infectipon but without leukocytosis I think this is unlikely RTC if any additional symptoms arise or does not get better as expected    Orders Placed This Encounter  Procedures  . POCT Microscopic Urinalysis ([facility])  . POCT urinalysis dipstick    Meds ordered this encounter  Medications  . ciprofloxacin (CIPRO) 250 MG tablet    Sig: Take 1 tablet (250 mg total) by mouth 2 (two) times daily.    Dispense:  14 tablet    Refill:   0  . sitaGLIPtin-metformin (JANUMET) 50-1000 MG tablet    Sig: TAKE 1 TABLET BY MOUTH once daily with breakfast    Dispense:  20 tablet    Refill:  0    Laroy Apple, MD Tristan Schroeder [facility] 02/03/2016, 2:57 PM

## 2016-02-03 NOTE — Patient Instructions (Signed)
Great to meet you!  We are treating you for a UTI  Finish all antibiotics  If you have any additional symptoms or do not get better as expected please get seen again.   Urinary Tract Infection Urinary tract infections (UTIs) can develop anywhere along your urinary tract. Your urinary tract is your body's drainage system for removing wastes and extra water. Your urinary tract includes two kidneys, two ureters, a bladder, and a urethra. Your kidneys are a pair of bean-shaped organs. Each kidney is about the size of your fist. They are located below your ribs, one on each side of your spine. CAUSES Infections are caused by microbes, which are microscopic organisms, including fungi, viruses, and bacteria. These organisms are so small that they can only be seen through a microscope. Bacteria are the microbes that most commonly cause UTIs. SYMPTOMS  Symptoms of UTIs may vary by age and gender of the patient and by the location of the infection. Symptoms in young women typically include a frequent and intense urge to urinate and a painful, burning feeling in the bladder or urethra during urination. Older women and men are more likely to be tired, shaky, and weak and have muscle aches and abdominal pain. A fever may mean the infection is in your kidneys. Other symptoms of a kidney infection include pain in your back or sides below the ribs, nausea, and vomiting. DIAGNOSIS To diagnose a UTI, your caregiver will ask you about your symptoms. Your caregiver will also ask you to provide a urine sample. The urine sample will be tested for bacteria and white blood cells. White blood cells are made by your body to help fight infection. TREATMENT  Typically, UTIs can be treated with medication. Because most UTIs are caused by a bacterial infection, they usually can be treated with the use of antibiotics. The choice of antibiotic and length of treatment depend on your symptoms and the type of bacteria causing your  infection. HOME CARE INSTRUCTIONS  If you were prescribed antibiotics, take them exactly as your caregiver instructs you. Finish the medication even if you feel better after you have only taken some of the medication.  Drink enough water and fluids to keep your urine clear or pale yellow.  Avoid caffeine, tea, and carbonated beverages. They tend to irritate your bladder.  Empty your bladder often. Avoid holding urine for long periods of time.  Empty your bladder before and after sexual intercourse.  After a bowel movement, women should cleanse from front to back. Use each tissue only once. SEEK MEDICAL CARE IF:   You have back pain.  You develop a fever.  Your symptoms do not begin to resolve within 3 days. SEEK IMMEDIATE MEDICAL CARE IF:   You have severe back pain or lower abdominal pain.  You develop chills.  You have nausea or vomiting.  You have continued burning or discomfort with urination. MAKE SURE YOU:   Understand these instructions.  Will watch your condition.  Will get help right away if you are not doing well or get worse.   This information is not intended to replace advice given to you by your health care provider. Make sure you discuss any questions you have with your health care provider.   Document Released: 07/27/2005 Document Revised: 07/08/2015 Document Reviewed: 11/25/2011 Elsevier Interactive Patient Education [employer].

## 2016-02-04 LAB — URINE CULTURE

## 2016-02-23 ENCOUNTER — Other Ambulatory Visit: Payer: Self-pay | Admitting: Family Medicine

## 2016-03-16 ENCOUNTER — Other Ambulatory Visit: Payer: Self-pay | Admitting: Family Medicine

## 2016-04-07 ENCOUNTER — Other Ambulatory Visit: Payer: Self-pay | Admitting: Family Medicine

## 2016-04-07 ENCOUNTER — Ambulatory Visit (INDEPENDENT_AMBULATORY_CARE_PROVIDER_SITE_OTHER): Payer: BC Managed Care – PPO | Admitting: Family Medicine

## 2016-04-07 VITALS — BP 148/90 | HR 81 | Temp 98.5°F | Resp 16 | Ht 62.0 in | Wt 181.0 lb

## 2016-04-07 DIAGNOSIS — M797 Fibromyalgia: Secondary | ICD-10-CM

## 2016-04-07 DIAGNOSIS — E785 Hyperlipidemia, unspecified: Secondary | ICD-10-CM

## 2016-04-07 DIAGNOSIS — Z1159 Encounter for screening for other viral diseases: Secondary | ICD-10-CM | POA: Diagnosis not present

## 2016-04-07 DIAGNOSIS — I2510 Atherosclerotic heart disease of native coronary artery without angina pectoris: Secondary | ICD-10-CM | POA: Diagnosis not present

## 2016-04-07 DIAGNOSIS — I10 Essential (primary) hypertension: Secondary | ICD-10-CM

## 2016-04-07 DIAGNOSIS — Z114 Encounter for screening for human immunodeficiency virus [HIV]: Secondary | ICD-10-CM | POA: Diagnosis not present

## 2016-04-07 DIAGNOSIS — I252 Old myocardial infarction: Secondary | ICD-10-CM | POA: Diagnosis not present

## 2016-04-07 DIAGNOSIS — K219 Gastro-esophageal reflux disease without esophagitis: Secondary | ICD-10-CM | POA: Diagnosis not present

## 2016-04-07 DIAGNOSIS — E1159 Type 2 diabetes mellitus with other circulatory complications: Secondary | ICD-10-CM

## 2016-04-07 DIAGNOSIS — E669 Obesity, unspecified: Secondary | ICD-10-CM | POA: Diagnosis not present

## 2016-04-07 LAB — POCT GLYCOSYLATED HEMOGLOBIN (HGB A1C): HEMOGLOBIN A1C: 6

## 2016-04-07 NOTE — Patient Instructions (Addendum)
IF you received an x-ray today, you will receive an invoice from [facility]. Please contact [facility] at [phone] with questions or concerns regarding your invoice.   IF you received labwork today, you will receive an invoice from [employer]. Please contact [facility] at [phone] with questions or concerns regarding your invoice.   Our billing staff will not be able to assist you with questions regarding bills from these companies.  You will be contacted with the lab results as soon as they are available. The fastest way to get your results is to activate your My Chart account. Instructions are located on the last page of this paperwork. If you have not heard from [place] regarding the results in 2 weeks, please contact this office.         We recommend that you schedule a mammogram for breast cancer screening. Typically, you do not need a referral to do this. Please contact a local imaging center to schedule your mammogram.  [facility] - [phone]  *ask for the Radiology Department [facility]) - [phone] or [phone]  [facility] - [phone] [place] [phone] [facility] - [phone]  *ask for the [facility] - [phone]  *ask for the Radiology Department [facility] - [phone]  *ask for the [facility] - [phone]     Basic Carbohydrate Counting for Diabetes Mellitus Carbohydrate counting is a method for keeping track of the amount of carbohydrates you eat. Eating carbohydrates naturally increases the level of sugar (glucose) in your blood, so it is important for you to know the amount that is okay for you to have in every meal. Carbohydrate counting helps keep the level of glucose in your blood within normal limits. The amount of  carbohydrates allowed is different for every person. A dietitian can help you calculate the amount that is right for you. Once you know the amount of carbohydrates you can have, you can count the carbohydrates in the foods you want to eat. Carbohydrates are found in the following foods:  Grains, such as breads and cereals.  Dried beans and soy products.  Starchy vegetables, such as potatoes, peas, and corn.  Fruit and fruit juices.  Milk and yogurt.  Sweets and snack foods, such as cake, cookies, candy, chips, soft drinks, and fruit drinks. CARBOHYDRATE COUNTING There are two ways to count the carbohydrates in your food. You can use either of the methods or a combination of both. Reading the "Nutrition Facts" on [place] The "Nutrition Facts" is an area that is included on the labels of almost all packaged food and beverages in the [place]. It includes the serving size of that food or beverage and information about the nutrients in each serving of the food, including the grams (g) of carbohydrate per serving.  Decide the number of servings of this food or beverage that you will be able to eat or drink. Multiply that number of servings by the number of grams of carbohydrate that is listed on the label for that serving. The total will be the amount of carbohydrates you will be having when you eat or drink this food or beverage. Learning Standard Serving Sizes of Food When you eat food that is not packaged or does not include "Nutrition Facts" on the label, you need to measure the servings in order to count  the amount of carbohydrates.A serving of most carbohydrate-rich foods contains about 15 g of carbohydrates. The following list includes serving sizes of carbohydrate-rich foods that provide 15 g ofcarbohydrate per serving:   1 slice of bread (1 oz) or 1 six-inch tortilla.    of a hamburger bun or English muffin.  4-6 crackers.   cup unsweetened dry cereal.    cup hot  cereal.   cup rice or pasta.    cup mashed potatoes or  of a large baked potato.  1 cup fresh fruit or one small piece of fruit.    cup canned or frozen fruit or fruit juice.  1 cup milk.   cup plain fat-free yogurt or yogurt sweetened with artificial sweeteners.   cup cooked dried beans or starchy vegetable, such as peas, corn, or potatoes.  Decide the number of standard-size servings that you will eat. Multiply that number of servings by 15 (the grams of carbohydrates in that serving). For example, if you eat 2 cups of strawberries, you will have eaten 2 servings and 30 g of carbohydrates (2 servings x 15 g = 30 g). For foods such as soups and casseroles, in which more than one food is mixed in, you will need to count the carbohydrates in each food that is included. EXAMPLE OF CARBOHYDRATE COUNTING Sample Dinner  3 oz chicken breast.   cup of brown rice.   cup of corn.  1 cup milk.   1 cup strawberries with sugar-free whipped topping.  Carbohydrate Calculation Step 1: Identify the foods that contain carbohydrates:   Rice.   Corn.   Milk.   Strawberries. Step 2:Calculate the number of servings eaten of each:   2 servings of rice.   1 serving of corn.   1 serving of milk.   1 serving of strawberries. Step 3: Multiply each of those number of servings by 15 g:   2 servings of rice x 15 g = 30 g.   1 serving of corn x 15 g = 15 g.   1 serving of milk x 15 g = 15 g.   1 serving of strawberries x 15 g = 15 g. Step 4: Add together all of the amounts to find the total grams of carbohydrates eaten: 30 g + 15 g + 15 g + 15 g = 75 g.   This information is not intended to replace advice given to you by your health care provider. Make sure you discuss any questions you have with your health care provider.   Document Released: 10/17/2005 Document Revised: 11/07/2014 Document Reviewed: 09/13/2013 Elsevier Interactive Patient Education [employer]

## 2016-04-07 NOTE — Progress Notes (Signed)
Subjective:    Patient ID: Suzanne Watkins, female    DOB: 06/14/1951, 65 y.o.   MRN: [id number]  04/07/2016  Medication Refill   HPI This 65 y.o. female presents for six month follow-up for the following:   1. DMII: Patient reports good compliance with medication, good tolerance to medication, and good symptom control.  Decreased to one Janumet daily; last HgbA1c 5.5.  Ran out of Janumet in past two days.  Last diabetic eye exam 2 years ago.  [facility].    2.  Hyperlipidemia:  Patient reports good compliance with medication, good tolerance to medication, and good symptom control.  Atorvastatin 80mg  daily; not taking Zetia for months.   3. CAD: taking Brilinta, Metoprolol, Diovan.  Last visit 12/2015.  Annually.  Four stents.    4. Insmonia: taking Benadryl PRN.  5.  GERD: Patient reports good compliance with medication, good tolerance to medication, and good symptom control.  OTC Prilosec.  6. Brain aneurysm: s/p MRI for unsteady gait; detected on MRI. No intervention.     Review of Systems  Constitutional: Negative for fever, chills, diaphoresis and fatigue.  Eyes: Negative for visual disturbance.  Respiratory: Negative for cough and shortness of breath.   Cardiovascular: Negative for chest pain, palpitations and leg swelling.  Gastrointestinal: Negative for nausea, vomiting, abdominal pain, diarrhea and constipation.  Endocrine: Negative for cold intolerance, heat intolerance, polydipsia, polyphagia and polyuria.  Neurological: Negative for dizziness, tremors, seizures, syncope, facial asymmetry, speech difficulty, weakness, light-headedness, numbness and headaches.    Past Medical History  Diagnosis Date  . Hypertension   . Hyperlipidemia   . GERD (gastroesophageal reflux disease)   . Obesity   . Coronary atherosclerosis of native coronary artery 09/04/2013    RCA and obtuse marginal stent/DES-2010  . Myocardial infarction ([place]) 07/2009    pt states that it was a  minor heart attack, w/ no damage  . Shortness of breath     "just related to angina I was having recently" (09/04/2013)  . Type II diabetes mellitus ([place])   . Arthritis     "probably in my hands" (09/04/2013)  . Fibromyalgia     "dx'd in 1994"  . Cervical cancer ([place]) 1986  . Basal cell carcinoma of nostril 05/2008   Past Surgical History  Procedure Laterality Date  . Cervical cone biopsy  03/1985  . Tubal ligation  1979  . Carpal tunnel release Right 07/1992  . Shoulder open rotator cuff repair Right 10/2004    "got 5 pins in" (09/04/2013)  . Coronary angioplasty with stent placement  07/2009, 08/2009; 09/04/2013    "1+1+2; total of 4" (09/04/2013)  . Mohs surgery Left 05/2008    "nostril"  . Left heart catheterization with coronary angiogram N/A 09/04/2013    Procedure: LEFT HEART CATHETERIZATION WITH CORONARY ANGIOGRAM;  Surgeon: Candee Furbish, MD;  Location: [facility] CATH LAB;  Service: Cardiovascular;  Laterality: N/A;   Allergies  Allergen Reactions  . Plavix [Clopidogrel Bisulfate] Other (See Comments)    Stomach upset    Social History   Social History  . Marital Status: Married    Spouse Name: N/A  . Number of Children: N/A  . Years of Education: N/A   Occupational History  . Not on file.   Social History Main Topics  . Smoking status: Former Smoker -- 1.00 packs/day for 29 years    Types: Cigarettes    Quit date: 06/14/2006  . Smokeless tobacco: Never Used  . Alcohol Use:  No  . Drug Use: No  . Sexual Activity: Yes   Other Topics Concern  . Not on file   Social History Narrative   Marital status: married      Children: grown children; 9 grandchildren; 1 gg      Lives: with husband, 2 birds/[employer]      Employment:  [employer] x 18 years; billing      Tobacco: quit smoking 05/2006      Alcohol:  None     Exercise: walking 30 minutes per day   Family History  Problem Relation Age of Onset  . Heart disease Mother 13    AMI; cause of death  .  Hyperlipidemia Mother   . Cancer Father     Throat  . Hyperlipidemia Brother   . Hypertension Brother   . Drug abuse Brother   . Diabetes Daughter   . Drug abuse Daughter   . Heart disease Daughter   . Heart disease Paternal Grandmother   . Stroke Paternal Grandfather   . Lupus Daughter        Objective:    BP 148/90 mmHg  Pulse 81  Temp(Src) 98.5 F (36.9 C) (Oral)  Resp 16  Ht 5\' 2"  (1.575 m)  Wt 181 lb (82.101 kg)  BMI 33.10 kg/m2  SpO2 97% Physical Exam  Constitutional: She is oriented to person, place, and time. She appears well-developed and well-nourished. No distress.  HENT:  Head: Normocephalic and atraumatic.  Right Ear: External ear normal.  Left Ear: External ear normal.  Nose: Nose normal.  Mouth/Throat: Oropharynx is clear and moist.  Eyes: Conjunctivae and EOM are normal. Pupils are equal, round, and reactive to light.  Neck: Normal range of motion. Neck supple. Carotid bruit is not present. No thyromegaly present.  Cardiovascular: Normal rate, regular rhythm, normal heart sounds and intact distal pulses.  Exam reveals no gallop and no friction rub.   No murmur heard. Pulmonary/Chest: Effort normal and breath sounds normal. She has no wheezes. She has no rales.  Abdominal: Soft. Bowel sounds are normal. She exhibits no distension and no mass. There is no tenderness. There is no rebound and no guarding.  Lymphadenopathy:    She has no cervical adenopathy.  Neurological: She is alert and oriented to person, place, and time. No cranial nerve deficit.  Skin: Skin is warm and dry. No rash noted. She is not diaphoretic. No erythema. No pallor.  Psychiatric: She has a normal mood and affect. Her behavior is normal.        Assessment & Plan:   1. Type 2 diabetes mellitus with other circulatory complication, without long-term current use of insulin ([place])   2. Atherosclerosis of native coronary artery of native heart without angina pectoris   3. Fibromyalgia    4. Gastroesophageal reflux disease without esophagitis   5. Hyperlipidemia   6. Essential hypertension   7. Obesity   8. Old myocardial infarction   9. Screening for HIV (human immunodeficiency virus)   10. Need for hepatitis C screening test     Orders Placed This Encounter  Procedures  . CBC with Differential/Platelet  . Comprehensive metabolic panel    Order Specific Question:  Has the patient fasted?    Answer:  Yes  . Lipid panel    Order Specific Question:  Has the patient fasted?    Answer:  Yes  . TSH  . HIV antibody  . Hepatitis C antibody  . Microalbumin, urine  .  POCT glycosylated hemoglobin (Hb A1C)   No orders of the defined types were placed in this encounter.    Return in about 6 months (around 10/07/2016) for recheck.    Kellen Dutch Elayne Guerin, M.D. [facility] [address] [place], [place]  [address] [phone] phone [phone] fax

## 2016-04-08 ENCOUNTER — Other Ambulatory Visit: Payer: Self-pay

## 2016-04-08 ENCOUNTER — Encounter: Payer: Self-pay | Admitting: Family Medicine

## 2016-04-08 DIAGNOSIS — E1142 Type 2 diabetes mellitus with diabetic polyneuropathy: Secondary | ICD-10-CM

## 2016-04-08 LAB — COMPREHENSIVE METABOLIC PANEL
ALK PHOS: 79 U/L (ref 33–130)
ALT: 23 U/L (ref 6–29)
AST: 20 U/L (ref 10–35)
Albumin: 4.4 g/dL (ref 3.6–5.1)
BUN: 15 mg/dL (ref 7–25)
CHLORIDE: 104 mmol/L (ref 98–110)
CO2: 22 mmol/L (ref 20–31)
CREATININE: 0.71 mg/dL (ref 0.50–0.99)
Calcium: 9.4 mg/dL (ref 8.6–10.4)
GLUCOSE: 102 mg/dL — AB (ref 65–99)
POTASSIUM: 4.6 mmol/L (ref 3.5–5.3)
Sodium: 140 mmol/L (ref 135–146)
Total Bilirubin: 0.4 mg/dL (ref 0.2–1.2)
Total Protein: 7 g/dL (ref 6.1–8.1)

## 2016-04-08 LAB — CBC WITH DIFFERENTIAL/PLATELET
BASOS ABS: 81 {cells}/uL (ref 0–200)
BASOS PCT: 1 %
EOS PCT: 7 %
Eosinophils Absolute: 567 cells/uL — ABNORMAL HIGH (ref 15–500)
HCT: 38.6 % (ref 35.0–45.0)
HEMOGLOBIN: 13.1 g/dL (ref 11.7–15.5)
LYMPHS ABS: 1620 {cells}/uL (ref 850–3900)
Lymphocytes Relative: 20 %
MCH: 30.8 pg (ref 27.0–33.0)
MCHC: 33.9 g/dL (ref 32.0–36.0)
MCV: 90.8 fL (ref 80.0–100.0)
MONOS PCT: 7 %
MPV: 11.1 fL (ref 7.5–12.5)
Monocytes Absolute: 567 cells/uL (ref 200–950)
NEUTROS ABS: 5265 {cells}/uL (ref 1500–7800)
Neutrophils Relative %: 65 %
Platelets: 229 10*3/uL (ref 140–400)
RBC: 4.25 MIL/uL (ref 3.80–5.10)
RDW: 13.7 % (ref 11.0–15.0)
WBC: 8.1 10*3/uL (ref 3.8–10.8)

## 2016-04-08 LAB — LIPID PANEL
CHOL/HDL RATIO: 4.5 ratio (ref <=5.0)
Cholesterol: 156 mg/dL (ref 125–200)
HDL: 35 mg/dL — ABNORMAL LOW (ref >=46)
LDL Cholesterol: 80 mg/dL (ref <130)
Triglycerides: 206 mg/dL — ABNORMAL HIGH (ref <150)
VLDL: 41 mg/dL — AB (ref <30)

## 2016-04-08 LAB — MICROALBUMIN, URINE: Microalb, Ur: 3.5 mg/dL

## 2016-04-08 LAB — HEPATITIS C ANTIBODY: HCV Ab: NEGATIVE

## 2016-04-08 LAB — TSH: TSH: 2.48 m[IU]/L

## 2016-04-08 LAB — HIV ANTIBODY (ROUTINE TESTING W REFLEX): HIV: NONREACTIVE

## 2016-04-08 MED ORDER — SITAGLIPTIN PHOS-METFORMIN HCL 50-1000 MG PO TABS: ORAL_TABLET | ORAL | Status: DC

## 2016-04-20 ENCOUNTER — Other Ambulatory Visit: Payer: Self-pay | Admitting: Interventional Cardiology

## 2016-04-29 MED ORDER — ATORVASTATIN CALCIUM 80 MG PO TABS: 80.0000 mg | ORAL_TABLET | Freq: Every day | ORAL | Status: DC

## 2016-04-29 MED ORDER — VALSARTAN 320 MG PO TABS: ORAL_TABLET | ORAL | Status: DC

## 2016-04-29 MED ORDER — METOPROLOL SUCCINATE ER 25 MG PO TB24: 25.0000 mg | ORAL_TABLET | Freq: Every day | ORAL | Status: DC

## 2016-04-29 MED ORDER — SITAGLIPTIN PHOS-METFORMIN HCL 50-1000 MG PO TABS: 1.0000 | ORAL_TABLET | Freq: Two times a day (BID) | ORAL | Status: DC

## 2016-04-29 NOTE — Addendum Note (Signed)
Addended by: Wardell Honour on: 04/29/2016 02:58 PM   Modules accepted: Orders

## 2016-08-03 ENCOUNTER — Encounter (HOSPITAL_COMMUNITY): Payer: Self-pay | Admitting: Emergency Medicine

## 2016-08-03 ENCOUNTER — Emergency Department (HOSPITAL_COMMUNITY): Payer: BC Managed Care – PPO

## 2016-08-03 ENCOUNTER — Emergency Department (HOSPITAL_COMMUNITY)
Admission: EM | Admit: 2016-08-03 | Discharge: 2016-08-03 | Disposition: A | Discharge status: Home / Self Care | Payer: BC Managed Care – PPO | Attending: Emergency Medicine | Admitting: Emergency Medicine

## 2016-08-03 DIAGNOSIS — Z8541 Personal history of malignant neoplasm of cervix uteri: Secondary | ICD-10-CM | POA: Diagnosis not present

## 2016-08-03 DIAGNOSIS — I10 Essential (primary) hypertension: Secondary | ICD-10-CM | POA: Diagnosis not present

## 2016-08-03 DIAGNOSIS — R791 Abnormal coagulation profile: Secondary | ICD-10-CM | POA: Insufficient documentation

## 2016-08-03 DIAGNOSIS — E119 Type 2 diabetes mellitus without complications: Secondary | ICD-10-CM | POA: Diagnosis not present

## 2016-08-03 DIAGNOSIS — Z79899 Other long term (current) drug therapy: Secondary | ICD-10-CM | POA: Diagnosis not present

## 2016-08-03 DIAGNOSIS — I2510 Atherosclerotic heart disease of native coronary artery without angina pectoris: Secondary | ICD-10-CM | POA: Insufficient documentation

## 2016-08-03 DIAGNOSIS — Z87891 Personal history of nicotine dependence: Secondary | ICD-10-CM | POA: Insufficient documentation

## 2016-08-03 DIAGNOSIS — I252 Old myocardial infarction: Secondary | ICD-10-CM | POA: Insufficient documentation

## 2016-08-03 DIAGNOSIS — R42 Dizziness and giddiness: Secondary | ICD-10-CM | POA: Diagnosis present

## 2016-08-03 DIAGNOSIS — Z7984 Long term (current) use of oral hypoglycemic drugs: Secondary | ICD-10-CM | POA: Diagnosis not present

## 2016-08-03 DIAGNOSIS — Z955 Presence of coronary angioplasty implant and graft: Secondary | ICD-10-CM | POA: Diagnosis not present

## 2016-08-03 LAB — URINALYSIS, ROUTINE W REFLEX MICROSCOPIC
BILIRUBIN URINE: NEGATIVE
Glucose, UA: NEGATIVE mg/dL
HGB URINE DIPSTICK: NEGATIVE
Ketones, ur: NEGATIVE mg/dL
Leukocytes, UA: NEGATIVE
NITRITE: NEGATIVE
PROTEIN: NEGATIVE mg/dL
SPECIFIC GRAVITY, URINE: 1.019 (ref 1.005–1.030)
pH: 6 (ref 5.0–8.0)

## 2016-08-03 LAB — RAPID URINE DRUG SCREEN, HOSP PERFORMED
Amphetamines: NOT DETECTED
BARBITURATES: NOT DETECTED
Benzodiazepines: NOT DETECTED
COCAINE: NOT DETECTED
Opiates: NOT DETECTED
TETRAHYDROCANNABINOL: NOT DETECTED

## 2016-08-03 LAB — CBC
HCT: 39.2 % (ref 36.0–46.0)
HEMOGLOBIN: 13.1 g/dL (ref 12.0–15.0)
MCH: 31 pg (ref 26.0–34.0)
MCHC: 33.4 g/dL (ref 30.0–36.0)
MCV: 92.7 fL (ref 78.0–100.0)
PLATELETS: 205 10*3/uL (ref 150–400)
RBC: 4.23 MIL/uL (ref 3.87–5.11)
RDW: 13.2 % (ref 11.5–15.5)
WBC: 8.3 10*3/uL (ref 4.0–10.5)

## 2016-08-03 LAB — BASIC METABOLIC PANEL
Anion gap: 11 (ref 5–15)
BUN: 15 mg/dL (ref 6–20)
CALCIUM: 9.9 mg/dL (ref 8.9–10.3)
CO2: 21 mmol/L — ABNORMAL LOW (ref 22–32)
CREATININE: 0.83 mg/dL (ref 0.44–1.00)
Chloride: 105 mmol/L (ref 101–111)
GLUCOSE: 134 mg/dL — AB (ref 65–99)
Potassium: 4.3 mmol/L (ref 3.5–5.1)
Sodium: 137 mmol/L (ref 135–145)

## 2016-08-03 LAB — HEPATIC FUNCTION PANEL
ALT: 20 U/L (ref 14–54)
AST: 30 U/L (ref 15–41)
Albumin: 3.9 g/dL (ref 3.5–5.0)
Alkaline Phosphatase: 64 U/L (ref 38–126)
BILIRUBIN DIRECT: 0.2 mg/dL (ref 0.1–0.5)
BILIRUBIN INDIRECT: 0.4 mg/dL (ref 0.3–0.9)
BILIRUBIN TOTAL: 0.6 mg/dL (ref 0.3–1.2)
TOTAL PROTEIN: 6.3 g/dL — AB (ref 6.5–8.1)

## 2016-08-03 LAB — PROTIME-INR
INR: 0.95
Prothrombin Time: 12.6 seconds (ref 11.4–15.2)

## 2016-08-03 LAB — ETHANOL

## 2016-08-03 LAB — CBG MONITORING, ED: GLUCOSE-CAPILLARY: 133 mg/dL — AB (ref 65–99)

## 2016-08-03 LAB — TROPONIN I

## 2016-08-03 LAB — APTT: aPTT: 26 seconds (ref 24–36)

## 2016-08-03 MED ORDER — MECLIZINE HCL 25 MG PO TABS: 25.0000 mg | ORAL_TABLET | Freq: Three times a day (TID) | ORAL | 0 refills | Status: DC | PRN

## 2016-08-03 MED ORDER — LORAZEPAM 2 MG/ML IJ SOLN: 1.0000 mg | Freq: Once | INTRAMUSCULAR | Status: DC

## 2016-08-03 NOTE — ED Notes (Signed)
Patient transported to MRI 

## 2016-08-03 NOTE — ED Triage Notes (Signed)
Pt in reporting dizziness/hypertension. Pt woke up from sleep to walk to bathroom & ran into a wall. Checked BP at home and it was 215/97. Denies CP, SOB, etc.

## 2016-08-03 NOTE — Discharge Instructions (Signed)
Please follow with your primary care doctor in the next 2 days for a check-up. They must obtain records for further management.  ° °Do not hesitate to return to the Emergency Department for any new, worsening or concerning symptoms.  ° °

## 2016-08-03 NOTE — ED Provider Notes (Signed)
[name] DEPT Provider Note   CSN: [id number] Arrival date & time: 08/03/16  [id number]     History   Chief Complaint Chief Complaint  Patient presents with  . Dizziness  . Hypertension    HPI  Blood pressure (!) 205/86, pulse 91, temperature 98.3 F (36.8 C), temperature source Oral, resp. rate 14, height 5\' 2"  (1.575 m), weight 88.5 kg, SpO2 100 %.  Suzanne Watkins is a 65 y.o. female with past medical history significant for insulin-dependent diabetes, CAD, hypertension, hyperlipidemia, anticoagulated with Brilinta complaining of dizziness sensation that the room is spinning onset when she woke up this morning at 5:15 AM, last seen normal last night at 10:30 PM. States she feels unsteady and nauseous, when she walks she has to steady herself, she ran into a wall this morning when standing up and walking to the bathroom. She also states that she had some difficulty swallowing her pills this morning, states is likely because she took a handful at once. She was able to get down her hypertension medication. She denies chest pain, headache, change in vision, dysarthria, cervicalgia, unilateral weakness, numbness. Husband reports that she had MRI which showed a Berry aneurysm in 2011 and feels that she is more dyspneic than normal.   HPI  Past Medical History:  Diagnosis Date  . Arthritis    "probably in my hands" (09/04/2013)  . 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 Cervical cancer ([place]) 1986  . Coronary atherosclerosis of native coronary artery 09/04/2013   RCA and obtuse marginal stent/DES-2010  . Fibromyalgia    "dx'd in 1994"  . GERD (gastroesophageal reflux disease)   . Hyperlipidemia   . Hypertension   . Myocardial infarction 07/2009   pt states that it was a minor heart attack, w/ no damage  . Obesity   . Shortness of breath    "just related to angina I was having recently" (09/04/2013)  . Type II diabetes mellitus [facility])     Patient Active Problem List   Diagnosis  Date Noted  . Unstable angina ([place]) 09/04/2013  . Coronary atherosclerosis of native coronary artery 09/04/2013  . 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 Hypertension   . Hyperlipidemia   . GERD (gastroesophageal reflux disease)   . Obesity   . Fibromyalgia   . Diabetes ([place]) 05/18/2013    Past Surgical History:  Procedure Laterality Date  . CARPAL TUNNEL RELEASE Right 07/1992  . CERVICAL CONE BIOPSY  03/1985  . CORONARY ANGIOPLASTY WITH STENT PLACEMENT  07/2009, 08/2009; 09/04/2013   "1+1+2; total of 4" (09/04/2013)  . LEFT HEART CATHETERIZATION WITH CORONARY ANGIOGRAM N/A 09/04/2013   Procedure: LEFT HEART CATHETERIZATION WITH CORONARY ANGIOGRAM;  Surgeon: Candee Furbish, MD;  Location: [facility] CATH LAB;  Service: Cardiovascular;  Laterality: N/A;  . MOHS SURGERY Left 05/2008   "nostril"  . SHOULDER OPEN ROTATOR CUFF REPAIR Right 10/2004   "got 5 pins in" (09/04/2013)  . TUBAL LIGATION  1979    OB History    No data available       Home Medications    Prior to Admission medications   Medication Sig Start Date End Date Taking? Authorizing Provider  atorvastatin (LIPITOR) 80 MG tablet Take 1 tablet (80 mg total) by mouth daily. 04/29/16  Yes Wardell Honour, MD  BRILINTA 90 MG TABS tablet TAKE 1 TABLET BY MOUTH TWICE DAILY 01/01/16  Yes Jettie Booze, MD  diphenhydrAMINE (BENADRYL) 50 MG capsule Take 100 mg by mouth at bedtime.  Yes Historical Provider, MD  glucose blood test strip Use as instructed. One touch ultra meter. 08/20/15  Yes Posey Boyer, MD  metoprolol succinate (TOPROL-XL) 25 MG 24 hr tablet Take 1 tablet (25 mg total) by mouth daily. 04/29/16  Yes Wardell Honour, MD  Multiple Vitamin (MULTIVITAMIN WITH MINERALS) TABS tablet Take 1 tablet by mouth daily.   Yes Historical Provider, MD  nitroGLYCERIN (NITROSTAT) 0.4 MG SL tablet Place 1 tablet (0.4 mg total) under the tongue every 5 (five) minutes as needed for chest pain. 09/05/13  Yes Jettie Booze, MD  omeprazole  (PRILOSEC) 20 MG capsule Take 20 mg by mouth daily.   Yes Historical Provider, MD  sitaGLIPtin-metformin (JANUMET) 50-1000 MG tablet Take 1 tablet by mouth 2 (two) times daily with a meal. 04/29/16  Yes Wardell Honour, MD  valsartan (DIOVAN) 320 MG tablet TAKE 1 TABLET(320 MG) BY MOUTH DAILY 04/29/16  Yes Wardell Honour, MD  ZETIA 10 MG tablet TAKE 1 TABLET BY MOUTH EVERY DAY 04/20/16  Yes Jettie Booze, MD  meclizine (ANTIVERT) 25 MG tablet Take 1 tablet (25 mg total) by mouth 3 (three) times daily as needed for dizziness. 08/03/16   Georjean Toya, PA-C  ONE TOUCH ULTRA TEST test strip USE AS DIRECTED 10/05/15   Roselee Culver, MD    Family History Family History  Problem Relation Age of Onset  . Heart disease Mother 73    AMI; cause of death  . Hyperlipidemia Mother   . Cancer Father     Throat  . Hyperlipidemia Brother   . Hypertension Brother   . Drug abuse Brother   . Diabetes Daughter   . Drug abuse Daughter   . Heart disease Daughter   . Lupus Daughter   . Heart disease Paternal Grandmother   . Stroke Paternal Grandfather     Social History Social History  Substance Use Topics  . Smoking status: Former Smoker    Packs/day: 1.00    Years: 29.00    Types: Cigarettes    Quit date: 06/14/2006  . Smokeless tobacco: Never Used  . Alcohol use No     Allergies   Plavix [clopidogrel bisulfate]   Review of Systems Review of Systems  10 systems reviewed and found to be negative, except as noted in the HPI.  Physical Exam Updated Vital Signs BP 142/78   Pulse 80   Temp 98.3 F (36.8 C) (Oral)   Resp 18   Ht 5\' 2"  (1.575 m)   Wt 88.5 kg   SpO2 100%   BMI 35.67 kg/m   Physical Exam  Constitutional: She is oriented to person, place, and time. She appears well-developed and well-nourished.  HENT:  Head: Normocephalic and atraumatic.  Mouth/Throat: Oropharynx is clear and moist.  Eyes: Conjunctivae and EOM are normal. Pupils are equal, round, and  reactive to light.  No TTP of maxillary or frontal sinuses  No TTP or induration of temporal arteries bilaterally  Neck: Normal range of motion. Neck supple.  FROM to C-spine. Pt can touch chin to chest without discomfort. No TTP of midline cervical spine.   Cardiovascular: Normal rate, regular rhythm and intact distal pulses.   Pulmonary/Chest: Effort normal and breath sounds normal. No respiratory distress. She has no wheezes. She has no rales. She exhibits no tenderness.  Abdominal: Soft. Bowel sounds are normal. There is no tenderness.  Musculoskeletal: Normal range of motion. She exhibits no edema or tenderness.  Neurological: She is  alert and oriented to person, place, and time. No cranial nerve deficit.  II-Visual fields grossly intact. III/IV/VI-Extraocular movements intact.  Pupils reactive bilaterally. V/VII-Smile symmetric, equal eyebrow raise,  facial sensation intact VIII- Hearing grossly intact IX/X-Normal gag XI-bilateral shoulder shrug XII-midline tongue extension Motor: 5/5 bilaterally with normal tone and bulk Cerebellar: Normal finger-to-nose  and normal heel-to-shin test.   Romberg negative Ambulates with a coordinated gait, Patient has difficulty with walking on her tiptoes.  Dix-Hallpike is negative bilaterally.   Nursing note and vitals reviewed.    ED Treatments / Results  Labs (all labs ordered are listed, but only abnormal results are displayed) Labs Reviewed  BASIC METABOLIC PANEL - Abnormal; Notable for the following:       Result Value   CO2 21 (*)    Glucose, Bld 134 (*)    All other components within normal limits  HEPATIC FUNCTION PANEL - Abnormal; Notable for the following:    Total Protein 6.3 (*)    All other components within normal limits  CBG MONITORING, ED - Abnormal; Notable for the following:    Glucose-Capillary 133 (*)    All other components within normal limits  CBC  URINALYSIS, ROUTINE W REFLEX MICROSCOPIC (NOT AT [facility])    TROPONIN I  PROTIME-INR  ETHANOL  APTT  URINE RAPID DRUG SCREEN, HOSP PERFORMED    EKG  EKG Interpretation  Date/Time:  Wednesday August 03 2016 06:46:19 EDT Ventricular Rate:  93 PR Interval:    QRS Duration: 83 QT Interval:  366 QTC Calculation: 456 R Axis:   88 Text Interpretation:  Sinus rhythm Borderline right axis deviation Confirmed by Wilson Singer  MD, Annie Main [phone]) on 08/03/2016 7:48:17 AM       Radiology Mr Brain Wo Contrast  Result Date: 08/03/2016 CLINICAL DATA:  65 year old female will woke with severe vertigo today. Symptoms improving at this time. Initial encounter. EXAM: MRI HEAD WITHOUT CONTRAST TECHNIQUE: Multiplanar, multiecho pulse sequences of the brain and surrounding structures were obtained without intravenous contrast. COMPARISON:  Head CT without contrast 05/11/2007. FINDINGS: Brain: No restricted diffusion to suggest acute infarction. No midline shift, mass effect, evidence of mass lesion, ventriculomegaly, extra-axial collection or acute intracranial hemorrhage. Cervicomedullary junction within normal limits. Partially empty sella. Pearline Cables and white matter signal is within normal limits for age throughout the brain. No cortical encephalomalacia or chronic cerebral blood products. Deep gray matter nuclei, brainstem, and cerebellum are within normal limits. Vascular: Major intracranial vascular flow voids are preserved, dominant and mildly dolichoectatic distal left vertebral artery. Skull and upper cervical spine: Negative. Normal bone marrow signal. Sinuses/Orbits: Negative orbits soft tissues. Trace left mastoid effusion, otherwise the paranasal sinuses and mastoids are clear. Negative nasopharynx. Negative scalp soft tissues. Other: Visible internal auditory structures appear grossly normal. IMPRESSION: 1. No acute intracranial abnormality. Unremarkable for age noncontrast MRI appearance of the brain. 2. Mild left mastoid effusion, likely postinflammatory and  significance doubtful. Electronically Signed   By: Genevie Ann M.D.   On: 08/03/2016 12:02    Procedures Procedures (including critical care time)  Medications Ordered in ED Medications  LORazepam (ATIVAN) injection 1 mg (0 mg Intravenous Hold 08/03/16 0828)     Initial Impression / Assessment and Plan / ED Course  I have reviewed the triage vital signs and the nursing notes.  Pertinent labs & imaging results that were available during my care of the patient were reviewed by me and considered in my medical decision making (see chart for details).  Clinical Course  Vitals:   08/03/16 1015 08/03/16 1224 08/03/16 1230 08/03/16 1245  BP: 138/69 158/84 147/81 142/78  Pulse: 81 87 71 80  Resp: 16 12 20 18   Temp:      TempSrc:      SpO2: 99% 100% 97% 100%  Weight:      Height:        Medications  LORazepam (ATIVAN) injection 1 mg (0 mg Intravenous Hold 08/03/16 0828)    Alverna Stuewe is 65 y.o. female presenting with Vertiginous sensation onset when waking this morning. She's had some issues with ataxia and ran into a wall earlier. My neurologic exam shows a nonfocal exam with some difficulty when ambulating on her tiptoes but she was otherwise normal. Dix-Hallpike was negative. Given this patient's risk factors of diabetes, CAD, hypertension, hyperlipidemia, concern for cerebellar stroke will obtain MRI, discussed with attending. Patient is out of code stroke window at this point.  Blood work reassuring, MRI without abnormality. On further discussion with this patient she states that the symptoms are exacerbated by head movement to the left, and given her some information on the Epley maneuver and advised her to follow closely with her PCP at [facility].  Evaluation does not show pathology that would require ongoing emergent intervention or inpatient treatment. Pt is hemodynamically stable and mentating appropriately. Discussed findings and plan with patient/guardian, who agrees with care  plan. All questions answered. Return precautions discussed and outpatient follow up given.    Final Clinical Impressions(s) / ED Diagnoses   Final diagnoses:  Dizziness    New Prescriptions Discharge Medication List as of 08/03/2016 12:29 PM    START taking these medications   Details  meclizine (ANTIVERT) 25 MG tablet Take 1 tablet (25 mg total) by mouth 3 (three) times daily as needed for dizziness., Starting Wed 08/03/2016, [employer], PA-C 08/03/16 1326    Virgel Manifold, MD 08/03/16 [phone]

## 2016-08-03 NOTE — ED Notes (Signed)
Wheeled the patient to the bathroom.  She endorsed dizziness and unsteadiness upon standing unassisted, so I helped her to and from the toilet and the wheelchair.

## 2016-08-03 NOTE — ED Notes (Signed)
EDP at bedside  

## 2016-08-08 ENCOUNTER — Other Ambulatory Visit: Payer: BC Managed Care – PPO | Admitting: *Deleted

## 2016-08-08 DIAGNOSIS — I252 Old myocardial infarction: Secondary | ICD-10-CM

## 2016-08-08 DIAGNOSIS — I10 Essential (primary) hypertension: Secondary | ICD-10-CM

## 2016-08-08 DIAGNOSIS — I2510 Atherosclerotic heart disease of native coronary artery without angina pectoris: Secondary | ICD-10-CM

## 2016-08-08 LAB — COMPREHENSIVE METABOLIC PANEL
ALK PHOS: 63 U/L (ref 33–130)
ALT: 14 U/L (ref 6–29)
AST: 16 U/L (ref 10–35)
Albumin: 4.2 g/dL (ref 3.6–5.1)
BUN: 15 mg/dL (ref 7–25)
CHLORIDE: 103 mmol/L (ref 98–110)
CO2: 24 mmol/L (ref 20–31)
Calcium: 9.8 mg/dL (ref 8.6–10.4)
Creat: 0.76 mg/dL (ref 0.50–0.99)
GLUCOSE: 111 mg/dL — AB (ref 65–99)
POTASSIUM: 4.4 mmol/L (ref 3.5–5.3)
Sodium: 138 mmol/L (ref 135–146)
Total Bilirubin: 0.4 mg/dL (ref 0.2–1.2)
Total Protein: 6.4 g/dL (ref 6.1–8.1)

## 2016-08-08 LAB — LIPID PANEL
CHOL/HDL RATIO: 3.8 ratio (ref <=5.0)
CHOLESTEROL: 114 mg/dL — AB (ref 125–200)
HDL: 30 mg/dL — AB (ref >=46)
LDL Cholesterol: 62 mg/dL (ref <130)
Triglycerides: 111 mg/dL (ref <150)
VLDL: 22 mg/dL (ref <30)

## 2016-08-12 ENCOUNTER — Encounter: Payer: Self-pay | Admitting: Interventional Cardiology

## 2016-08-13 ENCOUNTER — Other Ambulatory Visit: Payer: Self-pay

## 2016-08-13 MED ORDER — GLUCOSE BLOOD VI STRP: ORAL_STRIP | 0 refills | Status: DC

## 2016-09-22 ENCOUNTER — Other Ambulatory Visit: Payer: Self-pay | Admitting: Family Medicine

## 2016-09-24 NOTE — Telephone Encounter (Signed)
03/2016 last ov and labs 

## 2016-10-21 ENCOUNTER — Other Ambulatory Visit: Payer: Self-pay | Admitting: Family Medicine

## 2016-11-27 ENCOUNTER — Other Ambulatory Visit: Payer: Self-pay | Admitting: Family Medicine

## 2016-12-08 NOTE — Progress Notes (Signed)
Patient ID: Suzanne Watkins, female   DOB: 1950-11-27, 66 y.o.   MRN: [id number]     Cardiology Office Note   Date:  12/09/2016   ID:  Suzanne Watkins, DOB [birth year], MRN [id number]  PCP:  Delman Cheadle, MD    No chief complaint on file.  F/u CAD  Wt Readings from Last 3 Encounters:  12/09/16 184 lb (83.5 kg)  08/03/16 195 lb (88.5 kg)  04/07/16 181 lb (82.1 kg)       History of Present Illness: Suzanne Watkins is a 66 y.o. female  Who has had CAD.  She has had stents in all three coronary vessels.  Most recent were in 11/14.  She had been walking a lot, up to 13,000 steps daily but this has decreased in the cold.  She uses her headphones and enjoys the walking.  She walks around campus.  She is walking faster.  No chest pain or SHOB.  No NTG.  No edema.  No sx like prior angina.    No bleeding problems. No other medication side effects.  Blood sugars well controlled.  Last A1C was 6 per her report.  She had some hypoglycemia in the past.   She has some difficulty losing weight but continues to try.     Past Medical History:  Diagnosis Date  . Arthritis    "probably in my hands" (09/04/2013)  . 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 Cervical cancer ([place]) 1986  . Coronary atherosclerosis of native coronary artery 09/04/2013   RCA and obtuse marginal stent/DES-2010  . Fibromyalgia    "dx'd in 1994"  . GERD (gastroesophageal reflux disease)   . Hyperlipidemia   . Hypertension   . Myocardial infarction 07/2009   pt states that it was a minor heart attack, w/ no damage  . Obesity   . Shortness of breath    "just related to angina I was having recently" (09/04/2013)  . Type II diabetes mellitus ([place])     Past Surgical History:  Procedure Laterality Date  . CARPAL TUNNEL RELEASE Right 07/1992  . CERVICAL CONE BIOPSY  03/1985  . CORONARY ANGIOPLASTY WITH STENT PLACEMENT  07/2009, 08/2009; 09/04/2013   "1+1+2; total of 4" (09/04/2013)  . LEFT HEART CATHETERIZATION WITH CORONARY  ANGIOGRAM N/A 09/04/2013   Procedure: LEFT HEART CATHETERIZATION WITH CORONARY ANGIOGRAM;  Surgeon: Candee Furbish, MD;  Location: [facility] CATH LAB;  Service: Cardiovascular;  Laterality: N/A;  . MOHS SURGERY Left 05/2008   "nostril"  . SHOULDER OPEN ROTATOR CUFF REPAIR Right 10/2004   "got 5 pins in" (09/04/2013)  . TUBAL LIGATION  1979     Current Outpatient Prescriptions  Medication Sig Dispense Refill  . atorvastatin (LIPITOR) 80 MG tablet TAKE 1 TABLET(80 MG) BY MOUTH DAILY 90 tablet 0  . BRILINTA 90 MG TABS tablet TAKE 1 TABLET BY MOUTH TWICE DAILY 60 tablet 11  . diphenhydrAMINE (BENADRYL) 50 MG capsule Take 100 mg by mouth at bedtime.     Marland Kitchen glucose blood test strip Use as instructed. One touch ultra meter. 100 each 5  . meclizine (ANTIVERT) 25 MG tablet Take 1 tablet (25 mg total) by mouth 3 (three) times daily as needed for dizziness. 30 tablet 0  . metoprolol succinate (TOPROL-XL) 25 MG 24 hr tablet TAKE 1 TABLET(25 MG) BY MOUTH DAILY 90 tablet 0  . Multiple Vitamin (MULTIVITAMIN WITH MINERALS) TABS tablet Take 1 tablet by mouth daily.    . nitroGLYCERIN (NITROSTAT) 0.4 MG SL tablet Place 1  tablet (0.4 mg total) under the tongue every 5 (five) minutes as needed for chest pain. 25 tablet 12  . omeprazole (PRILOSEC) 20 MG capsule Take 20 mg by mouth daily.    . ONE TOUCH ULTRA TEST test strip USE AS DIRECTED 100 each 0  . sitaGLIPtin-metformin (JANUMET) 50-1000 MG tablet Take 1 tablet by mouth 2 (two) times daily with a meal. 180 tablet 1  . valsartan (DIOVAN) 320 MG tablet TAKE 1 TABLET(320 MG) BY MOUTH DAILY 90 tablet 0  . ZETIA 10 MG tablet TAKE 1 TABLET BY MOUTH EVERY DAY 30 tablet 8   No current facility-administered medications for this visit.     Allergies:   Plavix [clopidogrel bisulfate]    Social History:  The patient  reports that she quit smoking about 10 years ago. Her smoking use included Cigarettes. She has a 29.00 pack-year smoking history. She has never used smokeless  tobacco. She reports that she does not drink alcohol or use drugs.   Family History:  The patient's family history includes Cancer in her father; Diabetes in her daughter; Drug abuse in her brother and daughter; Heart disease in her daughter and paternal grandmother; Heart disease (age of onset: 2) in her mother; Hyperlipidemia in her brother and mother; Hypertension in her brother; Lupus in her daughter; Stroke in her paternal grandfather.    ROS:  Please see the history of present illness.   Otherwise, review of systems are positive for one episode of low blood sugar.   All other systems are reviewed and negative.    PHYSICAL EXAM: VS:  BP (!) 150/78 (BP Location: Right Arm, Patient Position: Sitting, Cuff Size: Normal)   Pulse 98   Ht 5\' 2"  (1.575 m)   Wt 184 lb (83.5 kg)   BMI 33.65 kg/m  , BMI Body mass index is 33.65 kg/m. GEN: Well nourished, well developed, in no acute distress  HEENT: normal  Neck: no JVD, carotid bruits, or masses Cardiac: RRR; no murmurs, rubs, or gallops,no edema  Respiratory:  clear to auscultation bilaterally, normal work of breathing GI: soft, nontender, nondistended, + BS MS: no deformity or atrophy  Skin: warm and dry, no rash Neuro:  Strength and sensation are intact Psych: euthymic mood, full affect   EKG:   The ekg ordered today demonstrates normal ECG   Recent Labs: 04/07/2016: TSH 2.48 08/03/2016: Hemoglobin 13.1; Platelets 205 08/08/2016: ALT 14; BUN 15; Creat 0.76; Potassium 4.4; Sodium 138   Lipid Panel    Component Value Date/Time   CHOL 114 (L) 08/08/2016 0731   CHOL 127 10/28/2014 0801   TRIG 111 08/08/2016 0731   TRIG 192 (H) 10/28/2014 0801   HDL 30 (L) 08/08/2016 0731   HDL 30 (L) 10/28/2014 0801   CHOLHDL 3.8 08/08/2016 0731   VLDL 22 08/08/2016 0731   LDLCALC 62 08/08/2016 0731   LDLCALC 59 10/28/2014 0801     Other studies Reviewed: Additional studies/ records that were reviewed today with results demonstrating:  lipids as above; multiple DES.   ASSESSMENT AND PLAN:  1. CAD/old MI: No angina.  She needed Prilosec so she took Brilinta instead. Stop aspirin.  She prefers to stay on Brilinta rather than go back to Plavix, although I think Pavix would be reasonable with PPI.  If there are bleeding issues, would switch to clopidogrel.  She is not allergic to Plavix.  It was the potential interaction with prilosec and her inability to tolerate a different PPI that caused [place]  to leave her on Brilinta.  Refill SL NTG.  2. DM: Well controlled.  A1C was around 6 per her report. Watch for low blood sugar.  SHe sometimes holds one of the DM meds if sugar is low.  I explained to there that too low of an A1C would put her at risk for hypoglycemia.   3. Hyperlipidemia: LDL controlled.   TG improved.  COntinue atorvastatin.  Will recheck labs in 10/18. 4. I encouraged her to continue to walk and lose weight. 5. Hypertensive heart disease: Continue to monitor BP.  Should improve with exercise.  Her BPs continue to  be high at home.  Start amlodipine 5  mg daily.  She will let us know if BP improves.  She feels the valsartan does not affect her BP much.  I think she gets a renal benefit so we will continue this medicine.    Current medicines are reviewed at length with the patient today.  The patient concerns regarding her medicines were addressed.  The following changes have been made:  No change  Labs/ tests ordered today include:  No orders of the defined types were placed in this encounter.   Recommend 150 minutes/week of aerobic exercise Low fat, low carb, high fiber diet recommended  Disposition:   FU in 1 year   Signed, Larae Grooms, MD  12/09/2016 2:57 PM    [facility] [place], [place], [place]  [address] Phone: [phone]; Fax: [phone]

## 2016-12-09 ENCOUNTER — Encounter: Payer: Self-pay | Admitting: Interventional Cardiology

## 2016-12-09 ENCOUNTER — Ambulatory Visit (INDEPENDENT_AMBULATORY_CARE_PROVIDER_SITE_OTHER): Payer: BC Managed Care – PPO | Admitting: Interventional Cardiology

## 2016-12-09 VITALS — BP 150/78 | HR 98 | Ht 62.0 in | Wt 184.0 lb

## 2016-12-09 DIAGNOSIS — E1159 Type 2 diabetes mellitus with other circulatory complications: Secondary | ICD-10-CM | POA: Diagnosis not present

## 2016-12-09 DIAGNOSIS — E782 Mixed hyperlipidemia: Secondary | ICD-10-CM

## 2016-12-09 DIAGNOSIS — I119 Hypertensive heart disease without heart failure: Secondary | ICD-10-CM

## 2016-12-09 DIAGNOSIS — I2510 Atherosclerotic heart disease of native coronary artery without angina pectoris: Secondary | ICD-10-CM | POA: Diagnosis not present

## 2016-12-09 MED ORDER — NITROGLYCERIN 0.4 MG SL SUBL: 0.4000 mg | SUBLINGUAL_TABLET | SUBLINGUAL | 3 refills | Status: DC | PRN

## 2016-12-09 MED ORDER — AMLODIPINE BESYLATE 5 MG PO TABS: 5.0000 mg | ORAL_TABLET | Freq: Every day | ORAL | 3 refills | Status: DC

## 2016-12-09 NOTE — Patient Instructions (Signed)
**Note De-Identified Suzanne Watkins Obfuscation** Medication Instructions:  Start taking Amlodipine 5 mg daily. All other medications remain the same.  Labwork: Lipids and CMET on Oct. 5 2018. Please do not eat or drink after midnight the night before labs are drawn.  Testing/Procedures: None  Follow-Up: Your physician wants you to follow-up in: 1 year. You will receive a reminder letter in the mail two months in advance. If you don't receive a letter, please call our office to schedule the follow-up appointment.     If you need a refill on your cardiac medications before your next appointment, please call your pharmacy.

## 2017-01-19 ENCOUNTER — Other Ambulatory Visit: Payer: Self-pay | Admitting: Family Medicine

## 2017-01-24 ENCOUNTER — Other Ambulatory Visit: Payer: Self-pay | Admitting: Interventional Cardiology

## 2017-02-06 ENCOUNTER — Other Ambulatory Visit: Payer: Self-pay | Admitting: Interventional Cardiology

## 2017-02-12 NOTE — Progress Notes (Signed)
Subjective:    Patient ID: Suzanne Watkins, female    DOB: Mar 25, 1951, 66 y.o.   MRN: [id number] Chief Complaint  Patient presents with  . Follow-up    TO CHECK A1C  . Medication Refill    diabetes supplies    HPI  Suzanne Watkins is a delightful 66 yo woman here today for a review of her below chronic medical problems.  She was last seen here by my colleague 10 mons prior and I have not seen pt in > 2 yrs  DMII: On Janumet 50-1000 bid. hgba1c 5.5->5.5 -> 6.0 ->6.1.  Sometimes she is on it once a day and then takes a glucophage at night but would like to stay on bid for now. But 140-160 in the morning. CBGs 120-140 before lunch. In the afternoon goes down to 99 on the afternoon. Then snacks on low-cal popcorn. Patient reports good compliance with medication, good tolerance to medication, and good symptom control.  Denies any recent hypoglycemic episodes. She does have a lot of  [facility] - last DM eye exam but has to much to do and . Normal urine microalb 10 mos prior.  Not walking a lot for exercise anymore. No asa since on Brilinta.  HTN: Monitors BP at home. On valsartan 320, toprol XL 25mg  qd, and amlodipine 5mg  - the latter was just started at her cardiology visit 2 mos ago.  HLD: On atorvasatin 55 and zetia 60  CAD s/p old MI with no residual deficit; w/ 4 stents: On Brilinta 90mg  bid (since has to take prilosec) as well as statin, BB, arb. She sees her cardiologist Dr.  Irish Lack annually, last seen 2 mos prior. Patient's mother died of an MI when patient was 28. Prn SL nitro which she has not needed at all.  Dr. Irish Lack noted that if there were any bleeding issues, he would switch to clopidogrel which he tihngs would be reasonable with a ppi.  GERD: on otc prilosec and cannot tolerate a different ppi or go without but does control sxs well.  Fibromyalgia: dx'd in 1994  Obesity: difficult but cont to try to increase exercise - Can walk over her lunch break. Works at [employer] in [employer] and has a 1 hour for lunch. 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 No intervention.    Insmonia: taking Benadryl PRN.   Her youngest daughter has a type of neuropathy in her hands of unknown etiology. She doesn't know what type it is and doesn't think she can find out as her daughter has 4 children and lives in [place] so they do not have much time to talk.  Has foot and hand numbness. Does states she has had severe carpal tunnel for decades. She had right carpal tunnel surgery in 1994 but refused the left and notes that she has more weakness and numbness in her thumbs. Also states that she has intermittent numbness in the soles of her feet. However this does not bother her and she has no significant Baker pain other than occasional stabbing which may be a complication of arthritis per patient. Denies burning or tingling. Denies significant weakness. She declines referral or treatment at this time.  Depression screen [facility] 2/9 02/13/2017 04/07/2016 02/03/2016 08/20/2015 05/09/2014  Decreased Interest 0 0 0 0 0  Down, Depressed, Hopeless 0 0 0 0 0  PHQ - 2 Score 0 0 0 0 0   Past Medical  History:  Diagnosis Date  . Arthritis    "probably in my hands" (09/04/2013)  . 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 Cervical cancer ([place]) 1986  . Coronary atherosclerosis of native coronary artery 09/04/2013   RCA and obtuse marginal stent/DES-2010  . Fibromyalgia    "dx'd in 1994"  . GERD (gastroesophageal reflux disease)   . Hyperlipidemia   . Hypertension   . Myocardial infarction ([place]) 07/2009   pt states that it was a minor heart attack, w/ no damage  . Obesity   . Shortness of breath    "just related to angina I was having recently" (09/04/2013)  . Type II diabetes mellitus ([place])    Past Surgical History:  Procedure Laterality Date  . CARPAL TUNNEL RELEASE Right 07/1992  . CERVICAL CONE BIOPSY  03/1985  .  CORONARY ANGIOPLASTY WITH STENT PLACEMENT  07/2009, 08/2009; 09/04/2013   "1+1+2; total of 4" (09/04/2013)  . LEFT HEART CATHETERIZATION WITH CORONARY ANGIOGRAM N/A 09/04/2013   Procedure: LEFT HEART CATHETERIZATION WITH CORONARY ANGIOGRAM;  Surgeon: Candee Furbish, MD;  Location: [facility] CATH LAB;  Service: Cardiovascular;  Laterality: N/A;  . MOHS SURGERY Left 05/2008   "nostril"  . SHOULDER OPEN ROTATOR CUFF REPAIR Right 10/2004   "got 5 pins in" (09/04/2013)  . TUBAL LIGATION  1979   Current Outpatient Prescriptions on File Prior to Visit  Medication Sig Dispense Refill  . amLODipine (NORVASC) 5 MG tablet Take 1 tablet (5 mg total) by mouth daily. 180 tablet 3  . BRILINTA 90 MG TABS tablet TAKE 1 TABLET BY MOUTH TWICE DAILY 60 tablet 9  . diphenhydrAMINE (BENADRYL) 50 MG capsule Take 100 mg by mouth at bedtime.     Marland Kitchen ezetimibe (ZETIA) 10 MG tablet TAKE 1 TABLET BY MOUTH EVERY DAY 30 tablet 0  . Multiple Vitamin (MULTIVITAMIN WITH MINERALS) TABS tablet Take 1 tablet by mouth daily.    . nitroGLYCERIN (NITROSTAT) 0.4 MG SL tablet Place 1 tablet (0.4 mg total) under the tongue every 5 (five) minutes as needed for chest pain. 25 tablet 3  . omeprazole (PRILOSEC) 20 MG capsule Take 20 mg by mouth daily.    . ONE TOUCH ULTRA TEST test strip USE AS DIRECTED 100 each 0   No current facility-administered medications on file prior to visit.    Allergies  Allergen Reactions  . Plavix [Clopidogrel Bisulfate] Other (See Comments)    Stomach upset   Family History  Problem Relation Age of Onset  . Heart disease Mother 83    AMI; cause of death  . Hyperlipidemia Mother   . Cancer Father     Throat  . Hyperlipidemia Brother   . Hypertension Brother   . Drug abuse Brother   . Diabetes Daughter   . Drug abuse Daughter   . Heart disease Daughter   . Lupus Daughter   . Heart disease Paternal Grandmother   . Stroke Paternal Grandfather    Social History   Social History  . Marital status: Married     Spouse name: N/A  . Number of children: N/A  . Years of education: N/A   Social History Main Topics  . Smoking status: Former Smoker    Packs/day: 1.00    Years: 29.00    Types: Cigarettes    Quit date: 06/14/2006  . Smokeless tobacco: Never Used  . Alcohol use No  . Drug use: No  . Sexual activity: Yes   Other Topics Concern  . None  Social History Narrative   Marital status: married      Children: grown children; 9 grandchildren; 1 gg      Lives: with husband, 2 Transport planner      Employment:  [employer] x 18 years; billing      Tobacco: quit smoking 05/2006      Alcohol:  None     Exercise: walking 30 minutes per day    Review of Systems See hpi    Objective:   Physical Exam  Constitutional: She is oriented to person, place, and time. She appears well-developed and well-nourished. No distress.  HENT:  Head: Normocephalic and atraumatic.  Right Ear: External ear normal.  Left Ear: External ear normal.  Eyes: Conjunctivae are normal. No scleral icterus.  Neck: Normal range of motion. Neck supple. No thyromegaly present.  Cardiovascular: Normal rate, regular rhythm, normal heart sounds and intact distal pulses.   Pulmonary/Chest: Effort normal and breath sounds normal. No respiratory distress.  Musculoskeletal: She exhibits no edema.  Lymphadenopathy:    She has no cervical adenopathy.  Neurological: She is alert and oriented to person, place, and time.  Skin: Skin is warm and dry. She is not diaphoretic. No erythema.  Psychiatric: She has a normal mood and affect. Her behavior is normal.      BP 119/77 (BP Location: Right Arm, Patient Position: Sitting, Cuff Size: Large)   Pulse 91   Temp 98.8 F (37.1 C) (Oral)   Resp 16   Ht 5\' 3"  (1.6 m)   Wt 182 lb 3.2 oz (82.6 kg)   SpO2 98%   BMI 32.28 kg/m   Assessment & Plan:  POCT hgba1c, lipid, cmp, microalb Foot exam  Refill valsartan, janumet, toprol, atorvastatin - wants to stay on bid janumet  and giong to increase exercise. Reminded patient to follow up with optometrist for diabetic eye exam.  Needs tdap Refer for mammogram Schedule next OV in 6 mos for CPE - she often waits 8-10 mos between visits in which case we are cathing up from the past yr on her health so encouraged pt to do CPE earlier than that. I do not see a prior CPE in chart  1. Type 2 diabetes mellitus with other circulatory complication, without long-term current use of insulin ([place])   2. Essential hypertension   3. Mixed hyperlipidemia   4. Atherosclerosis of native coronary artery of native heart without angina pectoris   5. Gastroesophageal reflux disease without esophagitis   6. Class 1 obesity due to excess calories with serious comorbidity and body mass index (BMI) of 33.0 to 33.9 in adult   7. Medication monitoring encounter   8. Neuropathy   9. Type 2 diabetes mellitus with diabetic polyneuropathy, without long-term current use of insulin ([place])     Orders Placed This Encounter  Procedures  . Comprehensive metabolic panel    Order Specific Question:   Has the patient fasted?    Answer:   Yes  . Lipid panel    Order Specific Question:   Has the patient fasted?    Answer:   Yes  . Microalbumin/Creatinine Ratio, Urine  . Vitamin B12  . RPR  . POCT glycosylated hemoglobin (Hb A1C)  . HM DIABETES FOOT EXAM    Meds ordered this encounter  Medications  . sitaGLIPtin-metformin (JANUMET) 50-1000 MG tablet    Sig: Take 1 tablet by mouth 2 (two) times daily with a meal.    Dispense:  180 tablet  Refill:  3  . valsartan (DIOVAN) 320 MG tablet    Sig: TAKE 1 TABLET(320 MG) BY MOUTH DAILY    Dispense:  90 tablet    Refill:  3  . metoprolol succinate (TOPROL-XL) 25 MG 24 hr tablet    Sig: Take 1 tablet (25 mg total) by mouth daily.    Dispense:  90 tablet    Refill:  3  . atorvastatin (LIPITOR) 80 MG tablet    Sig: Take 1 tablet (80 mg total) by mouth daily.    Dispense:  90 tablet    Refill:  3   . glucose blood test strip    Sig: Use as instructed. One touch ultra meter.    Dispense:  100 each    Refill:  11     Delman Cheadle, M.D.  Primary Care at [facility] [address] [place], [place] [address] [phone] phone [phone] fax  03/01/17 10:54 PM

## 2017-02-13 ENCOUNTER — Ambulatory Visit (INDEPENDENT_AMBULATORY_CARE_PROVIDER_SITE_OTHER): Payer: BC Managed Care – PPO | Admitting: Family Medicine

## 2017-02-13 ENCOUNTER — Encounter: Payer: Self-pay | Admitting: Family Medicine

## 2017-02-13 VITALS — BP 119/77 | HR 91 | Temp 98.8°F | Resp 16 | Ht 63.0 in | Wt 182.2 lb

## 2017-02-13 DIAGNOSIS — E782 Mixed hyperlipidemia: Secondary | ICD-10-CM | POA: Diagnosis not present

## 2017-02-13 DIAGNOSIS — K219 Gastro-esophageal reflux disease without esophagitis: Secondary | ICD-10-CM

## 2017-02-13 DIAGNOSIS — I2510 Atherosclerotic heart disease of native coronary artery without angina pectoris: Secondary | ICD-10-CM

## 2017-02-13 DIAGNOSIS — E1142 Type 2 diabetes mellitus with diabetic polyneuropathy: Secondary | ICD-10-CM

## 2017-02-13 DIAGNOSIS — G629 Polyneuropathy, unspecified: Secondary | ICD-10-CM | POA: Diagnosis not present

## 2017-02-13 DIAGNOSIS — E1159 Type 2 diabetes mellitus with other circulatory complications: Secondary | ICD-10-CM

## 2017-02-13 DIAGNOSIS — I10 Essential (primary) hypertension: Secondary | ICD-10-CM

## 2017-02-13 DIAGNOSIS — Z5181 Encounter for therapeutic drug level monitoring: Secondary | ICD-10-CM | POA: Diagnosis not present

## 2017-02-13 DIAGNOSIS — Z6833 Body mass index (BMI) 33.0-33.9, adult: Secondary | ICD-10-CM

## 2017-02-13 DIAGNOSIS — E6609 Other obesity due to excess calories: Secondary | ICD-10-CM

## 2017-02-13 LAB — POCT GLYCOSYLATED HEMOGLOBIN (HGB A1C): Hemoglobin A1C: 6.1

## 2017-02-13 MED ORDER — METOPROLOL SUCCINATE ER 25 MG PO TB24: 25.0000 mg | ORAL_TABLET | Freq: Every day | ORAL | 3 refills | Status: DC

## 2017-02-13 MED ORDER — VALSARTAN 320 MG PO TABS: ORAL_TABLET | ORAL | 3 refills | Status: DC

## 2017-02-13 MED ORDER — SITAGLIPTIN PHOS-METFORMIN HCL 50-1000 MG PO TABS: 1.0000 | ORAL_TABLET | Freq: Two times a day (BID) | ORAL | 3 refills | Status: DC

## 2017-02-13 MED ORDER — GLUCOSE BLOOD VI STRP: ORAL_STRIP | 11 refills | Status: DC

## 2017-02-13 MED ORDER — ATORVASTATIN CALCIUM 80 MG PO TABS: 80.0000 mg | ORAL_TABLET | Freq: Every day | ORAL | 3 refills | Status: DC

## 2017-02-13 NOTE — Patient Instructions (Addendum)
We recommend that you schedule a mammogram for breast cancer screening. Typically, you do not need a referral to do this. Please contact a local imaging center to schedule your mammogram.  [facility] - [phone]  *ask for the Radiology Department [facility] - [phone] or [phone]  [facility] - [phone] [facility] - [phone] [facility] - [phone]  *ask for the Radiology Department [facility] - [phone]  *ask for the Radiology Department [facility] - [phone]  *ask for the Mammography Department [facility] - [phone]     IF you received an x-ray today, you will receive an invoice from [facility]. Please contact [facility] at [phone] with questions or concerns regarding your invoice.   IF you received labwork today, you will receive an invoice from [facility]. Please contact [facility] at [phone] with questions or concerns regarding your invoice.   Our billing staff will not be able to assist you with questions regarding bills from these companies.  You will be contacted with the lab results as soon as they are available. The fastest way to get your results is to activate your My Chart account. Instructions are located on the last page of this paperwork. If you have not heard from us regarding the results in 2 weeks, please contact this office.      

## 2017-02-14 LAB — COMPREHENSIVE METABOLIC PANEL
A/G RATIO: 2 (ref 1.2–2.2)
ALT: 29 IU/L (ref 0–32)
AST: 24 IU/L (ref 0–40)
Albumin: 4.5 g/dL (ref 3.6–4.8)
Alkaline Phosphatase: 83 IU/L (ref 39–117)
BILIRUBIN TOTAL: 0.3 mg/dL (ref 0.0–1.2)
BUN/Creatinine Ratio: 21 (ref 12–28)
BUN: 19 mg/dL (ref 8–27)
CHLORIDE: 101 mmol/L (ref 96–106)
CO2: 21 mmol/L (ref 18–29)
Calcium: 9.8 mg/dL (ref 8.7–10.3)
Creatinine, Ser: 0.92 mg/dL (ref 0.57–1.00)
GFR, EST AFRICAN AMERICAN: 77 mL/min/{1.73_m2} (ref >59)
GFR, EST NON AFRICAN AMERICAN: 66 mL/min/{1.73_m2} (ref >59)
GLOBULIN, TOTAL: 2.2 g/dL (ref 1.5–4.5)
Glucose: 136 mg/dL — ABNORMAL HIGH (ref 65–99)
POTASSIUM: 4.4 mmol/L (ref 3.5–5.2)
SODIUM: 140 mmol/L (ref 134–144)
TOTAL PROTEIN: 6.7 g/dL (ref 6.0–8.5)

## 2017-02-14 LAB — LIPID PANEL
CHOL/HDL RATIO: 3.5 ratio (ref 0.0–4.4)
Cholesterol, Total: 114 mg/dL (ref 100–199)
HDL: 33 mg/dL — ABNORMAL LOW (ref >39)
LDL Calculated: 51 mg/dL (ref 0–99)
Triglycerides: 152 mg/dL — ABNORMAL HIGH (ref 0–149)
VLDL Cholesterol Cal: 30 mg/dL (ref 5–40)

## 2017-02-14 LAB — MICROALBUMIN / CREATININE URINE RATIO
CREATININE, UR: 131.4 mg/dL
MICROALBUM., U, RANDOM: 30.5 ug/mL
Microalb/Creat Ratio: 23.2 mg/g creat (ref 0.0–30.0)

## 2017-02-14 LAB — VITAMIN B12: Vitamin B-12: 683 pg/mL (ref 232–1245)

## 2017-02-14 LAB — RPR: RPR Ser Ql: NONREACTIVE

## 2017-03-07 ENCOUNTER — Other Ambulatory Visit: Payer: Self-pay | Admitting: Interventional Cardiology

## 2017-04-14 ENCOUNTER — Encounter: Payer: Self-pay | Admitting: Family Medicine

## 2017-04-14 LAB — HM DIABETES EYE EXAM

## 2017-04-18 LAB — HM DIABETES EYE EXAM

## 2017-05-01 ENCOUNTER — Encounter: Payer: Self-pay | Admitting: *Deleted

## 2017-08-04 ENCOUNTER — Other Ambulatory Visit: Payer: BC Managed Care – PPO

## 2017-08-11 ENCOUNTER — Other Ambulatory Visit: Payer: BC Managed Care – PPO | Admitting: *Deleted

## 2017-08-11 DIAGNOSIS — E782 Mixed hyperlipidemia: Secondary | ICD-10-CM

## 2017-08-12 LAB — LIPID PANEL
CHOLESTEROL TOTAL: 91 mg/dL — AB (ref 100–199)
Chol/HDL Ratio: 2.9 ratio (ref 0.0–4.4)
HDL: 31 mg/dL — ABNORMAL LOW (ref >39)
LDL Calculated: 37 mg/dL (ref 0–99)
Triglycerides: 117 mg/dL (ref 0–149)
VLDL CHOLESTEROL CAL: 23 mg/dL (ref 5–40)

## 2017-08-12 LAB — COMPREHENSIVE METABOLIC PANEL
A/G RATIO: 2.4 — AB (ref 1.2–2.2)
ALK PHOS: 75 IU/L (ref 39–117)
ALT: 30 IU/L (ref 0–32)
AST: 26 IU/L (ref 0–40)
Albumin: 4.4 g/dL (ref 3.6–4.8)
BILIRUBIN TOTAL: 0.3 mg/dL (ref 0.0–1.2)
BUN/Creatinine Ratio: 20 (ref 12–28)
BUN: 16 mg/dL (ref 8–27)
CHLORIDE: 99 mmol/L (ref 96–106)
CO2: 22 mmol/L (ref 20–29)
Calcium: 9.7 mg/dL (ref 8.7–10.3)
Creatinine, Ser: 0.82 mg/dL (ref 0.57–1.00)
GFR calc non Af Amer: 76 mL/min/{1.73_m2} (ref >59)
GFR, EST AFRICAN AMERICAN: 87 mL/min/{1.73_m2} (ref >59)
GLUCOSE: 119 mg/dL — AB (ref 65–99)
Globulin, Total: 1.8 g/dL (ref 1.5–4.5)
POTASSIUM: 4.5 mmol/L (ref 3.5–5.2)
Sodium: 138 mmol/L (ref 134–144)
TOTAL PROTEIN: 6.2 g/dL (ref 6.0–8.5)

## 2017-11-22 ENCOUNTER — Encounter: Payer: Self-pay | Admitting: Interventional Cardiology

## 2017-12-01 ENCOUNTER — Other Ambulatory Visit: Payer: Self-pay | Admitting: *Deleted

## 2017-12-01 ENCOUNTER — Other Ambulatory Visit: Payer: Self-pay | Admitting: Interventional Cardiology

## 2017-12-01 MED ORDER — ATORVASTATIN CALCIUM 80 MG PO TABS: 80.0000 mg | ORAL_TABLET | Freq: Every day | ORAL | 3 refills | Status: DC

## 2017-12-01 NOTE — Telephone Encounter (Signed)
When refilling patients zetia, I inadvertently removed atorvastatin from patients med list. Added back to med list.

## 2017-12-06 ENCOUNTER — Encounter: Payer: Self-pay | Admitting: Interventional Cardiology

## 2017-12-14 ENCOUNTER — Encounter: Payer: Self-pay | Admitting: Interventional Cardiology

## 2017-12-14 ENCOUNTER — Ambulatory Visit: Payer: BC Managed Care – PPO | Admitting: Interventional Cardiology

## 2017-12-14 VITALS — BP 138/78 | HR 75 | Ht 62.0 in | Wt 182.8 lb

## 2017-12-14 DIAGNOSIS — I2510 Atherosclerotic heart disease of native coronary artery without angina pectoris: Secondary | ICD-10-CM

## 2017-12-14 DIAGNOSIS — E1159 Type 2 diabetes mellitus with other circulatory complications: Secondary | ICD-10-CM | POA: Diagnosis not present

## 2017-12-14 DIAGNOSIS — I119 Hypertensive heart disease without heart failure: Secondary | ICD-10-CM

## 2017-12-14 DIAGNOSIS — I10 Essential (primary) hypertension: Secondary | ICD-10-CM | POA: Insufficient documentation

## 2017-12-14 DIAGNOSIS — E782 Mixed hyperlipidemia: Secondary | ICD-10-CM

## 2017-12-14 NOTE — Progress Notes (Signed)
Cardiology Office Note   Date:  12/14/2017   ID:  Suzanne Watkins, DOB 1951-07-10, MRN [id number]  PCP:  Shawnee Knapp, MD    No chief complaint on file. CAD   Wt Readings from Last 3 Encounters:  12/14/17 182 lb 12.8 oz (82.9 kg)  02/13/17 182 lb 3.2 oz (82.6 kg)  12/09/16 184 lb (83.5 kg)       History of Present Illness: Suzanne Watkins is a 67 y.o. female  Who has had CAD.  She has had stents in all three coronary vessels.  Most recent were in 11/14.  She used to walk a lot on the [employer]  She retired in Jan 2019, so she joined a senior center to exercise on the treadmill.  Denies : exertional Chest pain. Dizziness. Leg edema. Nitroglycerin use. Orthopnea. Palpitations. Paroxysmal nocturnal dyspnea. Shortness of breath. Syncope.   SHe has lost a little weight in the past few months.  SHe was as high as 192 lbs.    BP at home is in the 176-160 systolic.      Past Medical History:  Diagnosis Date  . Arthritis    "probably in my hands" (09/04/2013)  . 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 Cervical cancer ([place]) 1986  . Coronary atherosclerosis of native coronary artery 09/04/2013   RCA and obtuse marginal stent/DES-2010  . Fibromyalgia    "dx'd in 1994"  . GERD (gastroesophageal reflux disease)   . Hyperlipidemia   . Hypertension   . Myocardial infarction ([place]) 07/2009   pt states that it was a minor heart attack, w/ no damage  . Obesity   . Shortness of breath    "just related to angina I was having recently" (09/04/2013)  . Type II diabetes mellitus ([place])     Past Surgical History:  Procedure Laterality Date  . CARPAL TUNNEL RELEASE Right 07/1992  . CERVICAL CONE BIOPSY  03/1985  . CORONARY ANGIOPLASTY WITH STENT PLACEMENT  07/2009, 08/2009; 09/04/2013   "1+1+2; total of 4" (09/04/2013)  . LEFT HEART CATHETERIZATION WITH CORONARY ANGIOGRAM N/A 09/04/2013   Procedure: LEFT HEART CATHETERIZATION WITH CORONARY ANGIOGRAM;  Surgeon: Candee Furbish, MD;   Location: [facility] CATH LAB;  Service: Cardiovascular;  Laterality: N/A;  . MOHS SURGERY Left 05/2008   "nostril"  . SHOULDER OPEN ROTATOR CUFF REPAIR Right 10/2004   "got 5 pins in" (09/04/2013)  . TUBAL LIGATION  1979     Current Outpatient Medications  Medication Sig Dispense Refill  . atorvastatin (LIPITOR) 80 MG tablet Take 1 tablet (80 mg total) by mouth daily. 90 tablet 3  . BRILINTA 90 MG TABS tablet TAKE 1 TABLET BY MOUTH TWICE DAILY 60 tablet 0  . diphenhydrAMINE (BENADRYL) 50 MG capsule Take 100 mg by mouth at bedtime.     Marland Kitchen ezetimibe (ZETIA) 10 MG tablet TAKE 1 TABLET BY MOUTH EVERY DAY 30 tablet 0  . glucose blood test strip Use as instructed. One touch ultra meter. 100 each 11  . metoprolol succinate (TOPROL-XL) 25 MG 24 hr tablet Take 1 tablet (25 mg total) by mouth daily. 90 tablet 3  . Multiple Vitamin (MULTIVITAMIN WITH MINERALS) TABS tablet Take 1 tablet by mouth daily.    . nitroGLYCERIN (NITROSTAT) 0.4 MG SL tablet Place 1 tablet (0.4 mg total) under the tongue every 5 (five) minutes as needed for chest pain. 25 tablet 3  . omeprazole (PRILOSEC) 20 MG capsule Take 20 mg by mouth daily.    Marland Kitchen  ONE TOUCH ULTRA TEST test strip USE AS DIRECTED 100 each 0  . sitaGLIPtin-metformin (JANUMET) 50-1000 MG tablet Take 1 tablet by mouth 2 (two) times daily with a meal. 180 tablet 3  . valsartan (DIOVAN) 320 MG tablet TAKE 1 TABLET(320 MG) BY MOUTH DAILY 90 tablet 3  . amLODipine (NORVASC) 5 MG tablet Take 1 tablet (5 mg total) by mouth daily. 180 tablet 3   No current facility-administered medications for this visit.     Allergies:   Plavix [clopidogrel bisulfate]    Social History:  The patient  reports that she quit smoking about 11 years ago. Her smoking use included cigarettes. She has a 29.00 pack-year smoking history. she has never used smokeless tobacco. She reports that she does not drink alcohol or use drugs.   Family History:  The patient's family history includes Cancer in  her father; Diabetes in her daughter; Drug abuse in her brother and daughter; Heart disease in her daughter and paternal grandmother; Heart disease (age of onset: 53) in her mother; Hyperlipidemia in her brother and mother; Hypertension in her brother; Lupus in her daughter; Stroke in her paternal grandfather.    ROS:  Please see the history of present illness.   Otherwise, review of systems are positive for joint pains.   All other systems are reviewed and negative.    PHYSICAL EXAM: VS:  BP 138/78   Pulse 75   Ht 5\' 2"  (1.575 m)   Wt 182 lb 12.8 oz (82.9 kg)   SpO2 97%   BMI 33.43 kg/m  , BMI Body mass index is 33.43 kg/m. GEN: Well nourished, well developed, in no acute distress  HEENT: normal  Neck: no JVD, carotid bruits, or masses Cardiac: RRR; no murmurs, rubs, or gallops,no edema  Respiratory:  clear to auscultation bilaterally, normal work of breathing GI: soft, nontender, nondistended, + BS MS: no deformity or atrophy  Skin: warm and dry, no rash Neuro:  Strength and sensation are intact Psych: euthymic mood, full affect   EKG:   The ekg ordered today demonstrates NSR, no ST changes   Recent Labs: 08/11/2017: ALT 30; BUN 16; Creatinine, Ser 0.82; Potassium 4.5; Sodium 138   Lipid Panel    Component Value Date/Time   CHOL 91 (L) 08/11/2017 0731   CHOL 127 10/28/2014 0801   TRIG 117 08/11/2017 0731   TRIG 192 (H) 10/28/2014 0801   HDL 31 (L) 08/11/2017 0731   HDL 30 (L) 10/28/2014 0801   CHOLHDL 2.9 08/11/2017 0731   CHOLHDL 3.8 08/08/2016 0731   VLDL 22 08/08/2016 0731   LDLCALC 37 08/11/2017 0731   LDLCALC 59 10/28/2014 0801     Other studies Reviewed: Additional studies/ records that were reviewed today with results demonstrating: Labs reviewed.   ASSESSMENT AND PLAN:  1. CAD/Old MI: Increase exercise to the target below as part of secondary prevention.  2. DM: A1C was 6.1 at last check.  Continue metformin.   3. Hyperlipidemia: LDL 37 in 10/18.    4. Hypertensive heart disease: BP controlled.  Target < 130/80. Continue current meds.   We talked about increasing meds (Norvasc), but she will decrease caffeine to get systolic below 614.     Current medicines are reviewed at length with the patient today.  The patient concerns regarding her medicines were addressed.  The following changes have been made:  No change  Labs/ tests ordered today include:  No orders of the defined types were placed in this encounter.  Recommend 150 minutes/week of aerobic exercise Low fat, low carb, high fiber diet recommended  Disposition:   FU in 1 year   Signed, Larae Grooms, MD  12/14/2017 3:43 PM    [facility] [address], [place], [place]  [address] Phone: [phone]; Fax: [phone]

## 2017-12-14 NOTE — Patient Instructions (Signed)

## 2017-12-25 ENCOUNTER — Other Ambulatory Visit: Payer: Self-pay | Admitting: Interventional Cardiology

## 2018-02-26 ENCOUNTER — Other Ambulatory Visit: Payer: Self-pay | Admitting: Family Medicine

## 2018-02-26 ENCOUNTER — Other Ambulatory Visit: Payer: Self-pay | Admitting: Interventional Cardiology

## 2018-02-27 NOTE — Telephone Encounter (Signed)
atorvastatin refill Last OV: 02/13/17 (has upcoming appt 03/16/18) Last Refill:12/01/17 #90 3 RF "End date 03/01/18" Notes indicated form PCP and cardiology that pt is to continue meds.  Pharmacy:[facility] [address]  Delman Cheadle MD  Metoprolol refill Last OV: 02/13/17 ( pt has f/u 03/16/18) Last Refill:02/13/17 Pharmacy: [facility] Kathrine Haddock MD

## 2018-02-28 ENCOUNTER — Encounter: Payer: Self-pay | Admitting: Interventional Cardiology

## 2018-03-01 ENCOUNTER — Other Ambulatory Visit: Payer: Self-pay

## 2018-03-01 MED ORDER — METOPROLOL SUCCINATE ER 25 MG PO TB24: 25.0000 mg | ORAL_TABLET | Freq: Every day | ORAL | 3 refills | Status: DC

## 2018-03-01 MED ORDER — ATORVASTATIN CALCIUM 80 MG PO TABS: 80.0000 mg | ORAL_TABLET | Freq: Every day | ORAL | 3 refills | Status: DC

## 2018-03-16 ENCOUNTER — Ambulatory Visit: Payer: Medicare Other | Admitting: Family Medicine

## 2018-03-16 ENCOUNTER — Encounter: Payer: Self-pay | Admitting: Family Medicine

## 2018-03-16 ENCOUNTER — Other Ambulatory Visit: Payer: Self-pay

## 2018-03-16 ENCOUNTER — Ambulatory Visit (INDEPENDENT_AMBULATORY_CARE_PROVIDER_SITE_OTHER): Payer: Medicare Other

## 2018-03-16 VITALS — BP 118/66 | HR 77 | Temp 98.7°F | Resp 18 | Ht 62.0 in | Wt 178.8 lb

## 2018-03-16 DIAGNOSIS — R05 Cough: Secondary | ICD-10-CM | POA: Diagnosis not present

## 2018-03-16 DIAGNOSIS — N183 Chronic kidney disease, stage 3 unspecified: Secondary | ICD-10-CM

## 2018-03-16 DIAGNOSIS — Z87891 Personal history of nicotine dependence: Secondary | ICD-10-CM

## 2018-03-16 DIAGNOSIS — R059 Cough, unspecified: Secondary | ICD-10-CM

## 2018-03-16 DIAGNOSIS — E782 Mixed hyperlipidemia: Secondary | ICD-10-CM

## 2018-03-16 DIAGNOSIS — I10 Essential (primary) hypertension: Secondary | ICD-10-CM | POA: Diagnosis not present

## 2018-03-16 DIAGNOSIS — E1122 Type 2 diabetes mellitus with diabetic chronic kidney disease: Secondary | ICD-10-CM | POA: Diagnosis not present

## 2018-03-16 LAB — POCT URINALYSIS DIP (MANUAL ENTRY)
Bilirubin, UA: NEGATIVE
Blood, UA: NEGATIVE
Glucose, UA: NEGATIVE mg/dL
Ketones, POC UA: NEGATIVE mg/dL
LEUKOCYTES UA: NEGATIVE
Nitrite, UA: NEGATIVE
PH UA: 5.5 (ref 5.0–8.0)
PROTEIN UA: NEGATIVE mg/dL
SPEC GRAV UA: 1.02 (ref 1.010–1.025)
Urobilinogen, UA: 0.2 E.U./dL

## 2018-03-16 LAB — POCT CBC
Granulocyte percent: 72.4 %G (ref 37–80)
HCT, POC: 38.6 % (ref 37.7–47.9)
HEMOGLOBIN: 12.7 g/dL (ref 12.2–16.2)
LYMPH, POC: 1.8 (ref 0.6–3.4)
MCH, POC: 29.4 pg (ref 27–31.2)
MCHC: 33 g/dL (ref 31.8–35.4)
MCV: 89.1 fL (ref 80–97)
MID (cbc): 0.5 (ref 0–0.9)
MPV: 8.4 fL (ref 0–99.8)
PLATELET COUNT, POC: 263 10*3/uL (ref 142–424)
POC Granulocyte: 6 (ref 2–6.9)
POC LYMPH PERCENT: 21.4 %L (ref 10–50)
POC MID %: 6.2 %M (ref 0–12)
RBC: 4.33 M/uL (ref 4.04–5.48)
RDW, POC: 13.6 %
WBC: 8.3 10*3/uL (ref 4.6–10.2)

## 2018-03-16 LAB — POCT GLYCOSYLATED HEMOGLOBIN (HGB A1C): Hemoglobin A1C: 5.9

## 2018-03-16 MED ORDER — CETIRIZINE HCL 10 MG PO TABS: 10.0000 mg | ORAL_TABLET | Freq: Every day | ORAL | 11 refills | Status: DC

## 2018-03-16 MED ORDER — ALBUTEROL SULFATE (2.5 MG/3ML) 0.083% IN NEBU
2.5000 mg | INHALATION_SOLUTION | Freq: Once | RESPIRATORY_TRACT | Status: AC
  Administered 2018-03-16: 2.5 mg via RESPIRATORY_TRACT

## 2018-03-16 MED ORDER — IPRATROPIUM BROMIDE 0.02 % IN SOLN
0.5000 mg | Freq: Once | RESPIRATORY_TRACT | Status: AC
  Administered 2018-03-16: 0.5 mg via RESPIRATORY_TRACT

## 2018-03-16 MED ORDER — SITAGLIPTIN PHOS-METFORMIN HCL 50-1000 MG PO TABS: 1.0000 | ORAL_TABLET | Freq: Two times a day (BID) | ORAL | 3 refills | Status: DC

## 2018-03-16 MED ORDER — VALSARTAN 320 MG PO TABS: ORAL_TABLET | ORAL | 3 refills | Status: DC

## 2018-03-16 MED ORDER — ALBUTEROL SULFATE HFA 108 (90 BASE) MCG/ACT IN AERS
2.0000 | INHALATION_SPRAY | RESPIRATORY_TRACT | 1 refills | Status: DC | PRN
Start: 2018-03-16 — End: 2018-09-20

## 2018-03-16 MED ORDER — AZITHROMYCIN 250 MG PO TABS: ORAL_TABLET | ORAL | 0 refills | Status: DC

## 2018-03-16 NOTE — Patient Instructions (Addendum)
We recommend that you schedule a mammogram for breast cancer screening. Typically, you do not need a referral to do this. Please contact a local imaging center to schedule your mammogram.  [facility] - [phone]  *ask for the Radiology Department [facility] ([place]) - [phone] or [phone]  [facility] - [phone] [place] [phone] [facility] - [phone]  *ask for the [facility] - [phone]  *ask for the Radiology Department [facility] - [phone]  *ask for the [employer] - [phone]    IF you received an x-ray today, you will receive an invoice from [facility] Radiology. Please contact [facility] Radiology at [phone] with questions or concerns regarding your invoice.   IF you received labwork today, you will receive an invoice from [facility]. Please contact [facility] at [phone] with questions or concerns regarding your invoice.   Our billing staff will not be able to assist you with questions regarding bills from these companies.  You will be contacted with the lab results as soon as they are available. The fastest way to get your results is to activate your My Chart account. Instructions are located on the last page of this paperwork. If you have not heard from [place] regarding the results in 2 weeks, please contact this office.     Bronchospasm, Adult Bronchospasm is a tightening of the airways going into the lungs. During an episode, it may be harder to breathe. You may cough, and you may make a whistling sound when you breathe (wheeze). This condition often affects people with asthma. What are the causes? This condition is caused by swelling and irritation in the airways. It can be triggered by:  An infection (common).  Seasonal allergies.  An allergic reaction.  Exercise.  Irritants.  These include pollution, cigarette smoke, strong odors, aerosol sprays, and paint fumes.  Weather changes. Winds increase molds and pollens in the air. Cold air may cause swelling.  Stress and emotional upset.  What are the signs or symptoms? Symptoms of this condition include:  Wheezing. If the episode was triggered by an allergy, wheezing may start right away or hours later.  Nighttime coughing.  Frequent or severe coughing with a simple cold.  Chest tightness.  Shortness of breath.  Decreased ability to exercise.  How is this diagnosed? This condition is usually diagnosed with a review of your medical history and a physical exam. Tests, such as lung function tests, are sometimes done to look for other conditions. The need for a chest X-ray depends on where the wheezing occurs and whether it is the first time you have wheezed. How is this treated? This condition may be treated with:  Inhaled medicines. These open up the airways and help you breathe. They can be taken with an inhaler or a nebulizer device.  Corticosteroid medicines. These may be given for severe bronchospasm, usually when it is associated with asthma.  Avoiding triggers, such as irritants, infection, or allergies.  Follow these instructions at home: Medicines  Take over-the-counter and prescription medicines only as told by your health care provider.  If you need to use an inhaler or nebulizer to take your medicine, ask your health care provider to explain how to use it correctly. If you were given a spacer, always use it with your inhaler. Lifestyle  Reduce the number of triggers in your home. To do this: ? Change your  heating and air conditioning filter at least once a month. ? Limit your use of fireplaces and wood stoves. ? Do not smoke. Do not allow smoking in your home. ? Avoid using perfumes and fragrances. ? Get rid of pests, such as roaches and mice, and their droppings. ? Remove any mold from  your home. ? Keep your house clean and dust free. Use unscented cleaning products. ? Replace carpet with wood, tile, or vinyl flooring. Carpet can trap dander and dust. ? Use allergy-proof pillows, mattress covers, and box spring covers. ? Wash bed sheets and blankets every week in hot water. Dry them in a dryer. ? Use blankets that are made of polyester or cotton. ? Wash your hands often. ? Do not allow pets in your bedroom.  Avoid breathing in cold air when you exercise. General instructions  Have a plan for seeking medical care. Know when to call your health care provider and local emergency services, and where to get emergency care.  Stay up to date on your immunizations.  When you have an episode of bronchospasm, stay calm. Try to relax and breathe more slowly.  If you have asthma, make sure you have an asthma action plan.  Keep all follow-up visits as told by your health care provider. This is important. Contact a health care provider if:  You have muscle aches.  You have chest pain.  The mucus that you cough up (sputum) changes from clear or white to yellow, green, gray, or bloody.  You have a fever.  Your sputum gets thicker. Get help right away if:  Your wheezing and coughing get worse, even after you take your prescribed medicines.  It gets even harder to breathe.  You develop severe chest pain. Summary  Bronchospasm is a tightening of the airways going into the lungs.  During an episode of bronchospasm, you may have a harder time breathing. You may cough and make a whistling sound when you breathe (wheeze).  Avoid exposure to triggers such as smoke, dust, mold, animal dander, and fragrances.  When you have an episode of bronchospasm, stay calm. Try to relax and breathe more slowly. This information is not intended to replace advice given to you by your health care provider. Make sure you discuss any questions you have with your health care  provider. Document Released: 10/20/2003 Document Revised: 10/13/2016 Document Reviewed: 10/13/2016 Elsevier Interactive Patient Education  2017 [employer].

## 2018-03-16 NOTE — Progress Notes (Signed)
Subjective:  By signing my name below, I, Suzanne Watkins, attest that this documentation has been prepared under the direction and in the presence of Delman Cheadle, MD Electronically Signed: Ladene Artist, ED Scribe 03/16/2018 at 9:05 AM.   Patient ID: Suzanne Watkins, female    DOB: 05/22/1951, 67 y.o.   MRN: [id number]  Chief Complaint  Patient presents with  . Diabetes    Pt states she checks sugars at home. Pt states sugars run under 200. Pt states in the morning sugars re about 140-155 depending on what time she ate the night before.  . Follow-up   HPI Suzanne Watkins is a 67 y.o. female who presents to Primary Care at [facility] for f/u on DM. Pt reports morning fasting blood glucose of 140-155. States she goes to bed around 8/9 PM and does not eat until 8 AM the next morning. She reports a blood glucose reading of 120 once when she got up in the middle of the night. Pt rarely feels hypoglycemic symptoms. Pt is fasting at this visit.  Cough Pt reports dry cough, nasal congestion, wheezing and some difficulty taking deep breaths over the past wk with going on walks. States her husband was recently diagnosed with pneumonia. She still takes 2 benadryl tabs qhs. No h/o asthma or inhaler use. Pt quit smoking 11 yrs ago.  Past Medical History:  Diagnosis Date  . Arthritis    "probably in my hands" (09/04/2013)  . 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 Cervical cancer ([place]) 1986  . Coronary atherosclerosis of native coronary artery 09/04/2013   RCA and obtuse marginal stent/DES-2010  . Fibromyalgia    "dx'd in 1994"  . GERD (gastroesophageal reflux disease)   . Hyperlipidemia   . Hypertension   . Myocardial infarction ([place]) 07/2009   pt states that it was a minor heart attack, w/ no damage  . Obesity   . Shortness of breath    "just related to angina I was having recently" (09/04/2013)  . Type II diabetes mellitus ([place])    Current Outpatient Medications on File Prior to Visit  Medication  Sig Dispense Refill  . amLODipine (NORVASC) 5 MG tablet TAKE 1 TABLET BY MOUTH DAILY 180 tablet 3  . atorvastatin (LIPITOR) 80 MG tablet Take 1 tablet (80 mg total) by mouth daily. 90 tablet 3  . BRILINTA 90 MG TABS tablet TAKE 1 TABLET BY MOUTH TWICE DAILY 180 tablet 3  . diphenhydrAMINE (BENADRYL) 50 MG capsule Take 100 mg by mouth at bedtime.     Marland Kitchen ezetimibe (ZETIA) 10 MG tablet TAKE 1 TABLET BY MOUTH EVERY DAY 90 tablet 3  . glucose blood test strip Use as instructed. One touch ultra meter. 100 each 11  . metoprolol succinate (TOPROL-XL) 25 MG 24 hr tablet Take 1 tablet (25 mg total) by mouth daily. 90 tablet 3  . Multiple Vitamin (MULTIVITAMIN WITH MINERALS) TABS tablet Take 1 tablet by mouth daily.    . nitroGLYCERIN (NITROSTAT) 0.4 MG SL tablet Place 1 tablet (0.4 mg total) under the tongue every 5 (five) minutes as needed for chest pain. 25 tablet 3  . omeprazole (PRILOSEC) 20 MG capsule Take 20 mg by mouth daily.    . ONE TOUCH ULTRA TEST test strip USE AS DIRECTED 100 each 0  . sitaGLIPtin-metformin (JANUMET) 50-1000 MG tablet Take 1 tablet by mouth 2 (two) times daily with a meal. 180 tablet 3  . valsartan (DIOVAN) 320 MG tablet TAKE 1 TABLET(320 MG)  BY MOUTH DAILY 90 tablet 3   No current facility-administered medications on file prior to visit.    Allergies  Allergen Reactions  . Plavix [Clopidogrel Bisulfate] Other (See Comments)    Stomach upset   Past Surgical History:  Procedure Laterality Date  . CARPAL TUNNEL RELEASE Right 07/1992  . CERVICAL CONE BIOPSY  03/1985  . CORONARY ANGIOPLASTY WITH STENT PLACEMENT  07/2009, 08/2009; 09/04/2013   "1+1+2; total of 4" (09/04/2013)  . LEFT HEART CATHETERIZATION WITH CORONARY ANGIOGRAM N/A 09/04/2013   Procedure: LEFT HEART CATHETERIZATION WITH CORONARY ANGIOGRAM;  Surgeon: Candee Furbish, MD;  Location: [facility] CATH LAB;  Service: Cardiovascular;  Laterality: N/A;  . MOHS SURGERY Left 05/2008   "nostril"  . SHOULDER OPEN ROTATOR CUFF REPAIR  Right 10/2004   "got 5 pins in" (09/04/2013)  . TUBAL LIGATION  1979   Family History  Problem Relation Age of Onset  . Heart disease Mother 73       AMI; cause of death  . Hyperlipidemia Mother   . Cancer Father        Throat  . Hyperlipidemia Brother   . Hypertension Brother   . Drug abuse Brother   . Diabetes Daughter   . Drug abuse Daughter   . Heart disease Daughter   . Lupus Daughter   . Heart disease Paternal Grandmother   . Stroke Paternal Grandfather    Social History   Socioeconomic History  . Marital status: Married    Spouse name: Not on file  . Number of children: Not on file  . Years of education: Not on file  . Highest education level: Not on file  Occupational History  . Not on file  Social Needs  . Financial resource strain: Not on file  . Food insecurity:    Worry: Not on file    Inability: Not on file  . Transportation needs:    Medical: Not on file    Non-medical: Not on file  Tobacco Use  . Smoking status: Former Smoker    Packs/day: 1.00    Years: 29.00    Pack years: 29.00    Types: Cigarettes    Last attempt to quit: 06/14/2006    Years since quitting: 12.1  . Smokeless tobacco: Never Used  Substance and Sexual Activity  . Alcohol use: No  . Drug use: No  . Sexual activity: Yes  Lifestyle  . Physical activity:    Days per week: Not on file    Minutes per session: Not on file  . Stress: Not on file  Relationships  . Social connections:    Talks on phone: Not on file    Gets together: Not on file    Attends religious service: Not on file    Active member of club or organization: Not on file    Attends meetings of clubs or organizations: Not on file    Relationship status: Not on file  Other Topics Concern  . Not on file  Social History Narrative   Marital status: married      Children: grown children; 9 grandchildren; 1 gg      Lives: with husband, 2 birds/[employer]      Employment:  [employer] x 18 years; billing       Tobacco: quit smoking 05/2006      Alcohol:  None     Exercise: walking 30 minutes per day   Depression screen [facility] 2/9 03/16/2018 02/13/2017 04/07/2016 02/03/2016 08/20/2015  Decreased Interest  0 0 0 0 0  Down, Depressed, Hopeless 0 0 0 0 0  PHQ - 2 Score 0 0 0 0 0    Review of Systems  HENT: Positive for congestion.   Respiratory: Positive for cough and wheezing.       Objective:   Physical Exam  Constitutional: She is oriented to person, place, and time. She appears well-developed and well-nourished. No distress.  HENT:  Head: Normocephalic and atraumatic.  Right Ear: Tympanic membrane normal.  Left Ear: Tympanic membrane normal.  Nose: Rhinorrhea present.  Mouth/Throat: Oropharynx is clear and moist and mucous membranes are normal.  Eyes: Conjunctivae and EOM are normal.  Neck: Neck supple. No tracheal deviation present.  Cardiovascular: Normal rate.  Pulmonary/Chest: Effort normal. No respiratory distress. She has wheezes. She has rhonchi.  Decreased air movement in L lower lobe with inspiratory and expiratory wheezing. Recheck: Wheezing resolved but still bibasilar rhonchi. Symptomatically improved.  Musculoskeletal: Normal range of motion.  Neurological: She is alert and oriented to person, place, and time.  Skin: Skin is warm and dry.  Psychiatric: She has a normal mood and affect. Her behavior is normal.  Nursing note and vitals reviewed.  BP 118/66 (BP Location: Left Arm, Patient Position: Sitting, Cuff Size: Normal)   Pulse 77   Temp 98.7 F (37.1 C) (Oral)   Resp 18   Ht 5\' 2"  (1.575 m)   Wt 178 lb 12.8 oz (81.1 kg)   SpO2 99%   BMI 32.70 kg/m     Diabetic Foot Exam - Simple   Simple Foot Form Diabetic Foot exam was performed with the following findings:  Yes 03/16/2018  8:19 AM  Visual Inspection No deformities, no ulcerations, no other skin breakdown bilaterally:  Yes Sensation Testing Intact to touch and monofilament testing bilaterally:  Yes Pulse  Check Posterior Tibialis and Dorsalis pulse intact bilaterally:  Yes Comments    Results for orders placed or performed in visit on 03/16/18  POCT glycosylated hemoglobin (Hb A1C)  Result Value Ref Range   Hemoglobin A1C 5.9   POCT urinalysis dipstick  Result Value Ref Range   Color, UA yellow yellow   Clarity, UA clear clear   Glucose, UA negative negative mg/dL   Bilirubin, UA negative negative   Ketones, POC UA negative negative mg/dL   Spec Grav, UA 1.020 1.010 - 1.025   Blood, UA negative negative   pH, UA 5.5 5.0 - 8.0   Protein Ur, POC negative negative mg/dL   Urobilinogen, UA 0.2 0.2 or 1.0 E.U./dL   Nitrite, UA Negative Negative   Leukocytes, UA Negative Negative   Dg Chest 2 View  Result Date: 03/16/2018 CLINICAL DATA:  67 year old female with left lower lobe wheezing and abnormal pulmonary auscultation. Cough for 1 week. The patient's husband has a community-acquired pneumonia. EXAM: CHEST - 2 VIEW COMPARISON:  09/04/2013 and earlier. FINDINGS: Stable large lung volumes. No pneumothorax, pulmonary edema, pleural effusion or confluent pulmonary opacity. Mediastinal contours are stable and within normal limits. Visualized tracheal air column is within normal limits. No acute osseous abnormality identified. Negative visible bowel gas pattern. IMPRESSION: No acute cardiopulmonary abnormality. Electronically Signed   By: Genevie Ann M.D.   On: 03/16/2018 09:34   Assessment & Plan:  All meds other than Janumet and Diovan are prescribed by Dr. Irish Lack. No change to regimen. Ok to provide 1 yr refills of any meds if needed.  1. Type 2 diabetes mellitus with stage 3 chronic kidney disease, without  long-term current use of insulin ([place])   2. Mixed hyperlipidemia   3. Essential hypertension   4. Cough   5. History of tobacco use     Orders Placed This Encounter  Procedures  . DG Chest 2 View    Standing Status:   Future    Number of Occurrences:   1    Standing Expiration  Date:   03/16/2019    Order Specific Question:   Reason for Exam (SYMPTOM  OR DIAGNOSIS REQUIRED)    Answer:   LLL wheeze and rhonchi, cough x 1 wk, husband with CAPna, h/o tob use    Order Specific Question:   Preferred imaging location?    Answer:   External  . Comprehensive metabolic panel    Order Specific Question:   Has the patient fasted?    Answer:   Yes  . Lipid panel    Order Specific Question:   Has the patient fasted?    Answer:   Yes  . Microalbumin/Creatinine Ratio, Urine  . POCT glycosylated hemoglobin (Hb A1C)  . POCT urinalysis dipstick  . POCT CBC  . HM DIABETES FOOT EXAM    Meds ordered this encounter  Medications  . albuterol (PROVENTIL) (2.5 MG/3ML) 0.083% nebulizer solution 2.5 mg  . ipratropium (ATROVENT) nebulizer solution 0.5 mg  . sitaGLIPtin-metformin (JANUMET) 50-1000 MG tablet    Sig: Take 1 tablet by mouth 2 (two) times daily with a meal.    Dispense:  180 tablet    Refill:  3  . valsartan (DIOVAN) 320 MG tablet    Sig: TAKE 1 TABLET(320 MG) BY MOUTH DAILY    Dispense:  90 tablet    Refill:  3  . cetirizine (ZYRTEC) 10 MG tablet    Sig: Take 1 tablet (10 mg total) by mouth at bedtime. During allergy season    Dispense:  30 tablet    Refill:  11  . albuterol (PROVENTIL HFA;VENTOLIN HFA) 108 (90 Base) MCG/ACT inhaler    Sig: Inhale 2 puffs into the lungs every 4 (four) hours as needed for wheezing or shortness of breath (cough, shortness of breath or wheezing.).    Dispense:  1 Inhaler    Refill:  1  . azithromycin (ZITHROMAX) 250 MG tablet    Sig: Take 2 tabs PO x 1 dose, then 1 tab PO QD x 4 days    Dispense:  6 tablet    Refill:  0   I personally performed the services described in this documentation, which was scribed in my presence. The recorded information has been reviewed and considered, and addended by me as needed.   Delman Cheadle, M.D.  Primary Care at [facility] [address] [place], [place] [address] [phone]  phone [phone] fax  08/08/18 11:09 PM

## 2018-03-17 LAB — COMPREHENSIVE METABOLIC PANEL
ALBUMIN: 4.7 g/dL (ref 3.6–4.8)
ALK PHOS: 83 IU/L (ref 39–117)
ALT: 35 IU/L — ABNORMAL HIGH (ref 0–32)
AST: 29 IU/L (ref 0–40)
Albumin/Globulin Ratio: 2 (ref 1.2–2.2)
BUN/Creatinine Ratio: 18 (ref 12–28)
BUN: 14 mg/dL (ref 8–27)
Bilirubin Total: 0.3 mg/dL (ref 0.0–1.2)
CO2: 18 mmol/L — AB (ref 20–29)
CREATININE: 0.77 mg/dL (ref 0.57–1.00)
Calcium: 9.7 mg/dL (ref 8.7–10.3)
Chloride: 104 mmol/L (ref 96–106)
GFR calc Af Amer: 94 mL/min/{1.73_m2} (ref >59)
GFR calc non Af Amer: 82 mL/min/{1.73_m2} (ref >59)
GLUCOSE: 149 mg/dL — AB (ref 65–99)
Globulin, Total: 2.4 g/dL (ref 1.5–4.5)
Potassium: 4.6 mmol/L (ref 3.5–5.2)
Sodium: 140 mmol/L (ref 134–144)
Total Protein: 7.1 g/dL (ref 6.0–8.5)

## 2018-03-17 LAB — LIPID PANEL
CHOLESTEROL TOTAL: 115 mg/dL (ref 100–199)
Chol/HDL Ratio: 3.8 ratio (ref 0.0–4.4)
HDL: 30 mg/dL — ABNORMAL LOW (ref >39)
LDL CALC: 44 mg/dL (ref 0–99)
TRIGLYCERIDES: 203 mg/dL — AB (ref 0–149)
VLDL Cholesterol Cal: 41 mg/dL — ABNORMAL HIGH (ref 5–40)

## 2018-03-17 LAB — MICROALBUMIN / CREATININE URINE RATIO
CREATININE, UR: 65.2 mg/dL
Microalb/Creat Ratio: 48.6 mg/g creat — ABNORMAL HIGH (ref 0.0–30.0)
Microalbumin, Urine: 31.7 ug/mL

## 2018-03-27 ENCOUNTER — Encounter: Payer: Self-pay | Admitting: Family Medicine

## 2018-05-28 LAB — HM DIABETES EYE EXAM

## 2018-09-20 ENCOUNTER — Encounter: Payer: Self-pay | Admitting: Family Medicine

## 2018-09-20 ENCOUNTER — Other Ambulatory Visit: Payer: Self-pay

## 2018-09-20 ENCOUNTER — Ambulatory Visit (INDEPENDENT_AMBULATORY_CARE_PROVIDER_SITE_OTHER): Payer: Medicare Other | Admitting: Family Medicine

## 2018-09-20 VITALS — BP 118/72 | HR 69 | Temp 98.0°F | Resp 16 | Ht 63.39 in | Wt 175.0 lb

## 2018-09-20 DIAGNOSIS — Z0001 Encounter for general adult medical examination with abnormal findings: Secondary | ICD-10-CM | POA: Diagnosis not present

## 2018-09-20 DIAGNOSIS — E1159 Type 2 diabetes mellitus with other circulatory complications: Secondary | ICD-10-CM

## 2018-09-20 DIAGNOSIS — I10 Essential (primary) hypertension: Secondary | ICD-10-CM

## 2018-09-20 DIAGNOSIS — E782 Mixed hyperlipidemia: Secondary | ICD-10-CM | POA: Diagnosis not present

## 2018-09-20 DIAGNOSIS — E2839 Other primary ovarian failure: Secondary | ICD-10-CM | POA: Diagnosis not present

## 2018-09-20 DIAGNOSIS — Z1231 Encounter for screening mammogram for malignant neoplasm of breast: Secondary | ICD-10-CM

## 2018-09-20 DIAGNOSIS — Z23 Encounter for immunization: Secondary | ICD-10-CM | POA: Diagnosis not present

## 2018-09-20 DIAGNOSIS — Z87891 Personal history of nicotine dependence: Secondary | ICD-10-CM

## 2018-09-20 DIAGNOSIS — Z0000 Encounter for general adult medical examination without abnormal findings: Secondary | ICD-10-CM | POA: Diagnosis not present

## 2018-09-20 MED ORDER — ZOSTER VAC RECOMB ADJUVANTED 50 MCG/0.5ML IM SUSR: 0.5000 mL | Freq: Once | INTRAMUSCULAR | 1 refills | Status: AC

## 2018-09-20 MED ORDER — SITAGLIP PHOS-METFORMIN HCL ER 50-1000 MG PO TB24: 2.0000 | ORAL_TABLET | ORAL | 1 refills | Status: DC

## 2018-09-20 NOTE — Progress Notes (Addendum)
Subjective:    Jacquelina Hewins is a 67 y.o. female who presents for a Welcome to Medicare exam.   Review of Systems  Osteoporosis: Went to home show and had free DEXAscan of hands that said T score -3.95. Has started high dose calcium supp 1200mg   HTN: Occ BP outside of office - sometimes [phone] - depends - more higher 120s.  Seeing Dr. Gregary Cromer in Feb and she might need to increase her amlodipine to 10 (currently on 5) but prefers that her BP just be managed by cardiology. Doing fine on valsartan 320 and toprol 25.  DM: CBGs 120s qam and not "extremely high" during the day - occ 200s a time of too.  Has been decreasing medication. Has decreased Janumet 50-1000 to 1 tab a day about 2-3 weeks ago. The other meter was 40 pts off!  Lab Results  Component Value Date   HGBA1C 5.9 03/16/2018   HGBA1C 6.1 02/13/2017   HGBA1C 6.0 04/07/2016           Objective:    Today's Vitals   09/20/18 0924  BP: (!) 147/84  Pulse: 69  Resp: 16  Temp: 98 F (36.7 C)  TempSrc: Oral  SpO2: 98%  Weight: 175 lb (79.4 kg)  Height: 5' 3.39" (1.61 m)  Body mass index is 30.62 kg/m. REPEAT BP 118/72  Medications Outpatient Encounter Medications as of 09/20/2018  Medication Sig  . amLODipine (NORVASC) 5 MG tablet TAKE 1 TABLET BY MOUTH DAILY  . atorvastatin (LIPITOR) 80 MG tablet Take 1 tablet (80 mg total) by mouth daily.  Marland Kitchen BRILINTA 90 MG TABS tablet TAKE 1 TABLET BY MOUTH TWICE DAILY  . diphenhydrAMINE (BENADRYL) 50 MG capsule Take 100 mg by mouth at bedtime.   Marland Kitchen ezetimibe (ZETIA) 10 MG tablet TAKE 1 TABLET BY MOUTH EVERY DAY  . glucose blood test strip Use as instructed. One touch ultra meter.  . metoprolol succinate (TOPROL-XL) 25 MG 24 hr tablet Take 1 tablet (25 mg total) by mouth daily.  . Multiple Vitamin (MULTIVITAMIN WITH MINERALS) TABS tablet Take 1 tablet by mouth daily.  . nitroGLYCERIN (NITROSTAT) 0.4 MG SL tablet Place 1 tablet (0.4 mg total) under the tongue every 5  (five) minutes as needed for chest pain.  Marland Kitchen omeprazole (PRILOSEC) 20 MG capsule Take 20 mg by mouth daily.  . ONE TOUCH ULTRA TEST test strip USE AS DIRECTED  . sitaGLIPtin-metformin (JANUMET) 50-1000 MG tablet Take 1 tablet by mouth 2 (two) times daily with a meal.  . valsartan (DIOVAN) 320 MG tablet TAKE 1 TABLET(320 MG) BY MOUTH DAILY  . [DISCONTINUED] albuterol (PROVENTIL HFA;VENTOLIN HFA) 108 (90 Base) MCG/ACT inhaler Inhale 2 puffs into the lungs every 4 (four) hours as needed for wheezing or shortness of breath (cough, shortness of breath or wheezing.).  . [DISCONTINUED] azithromycin (ZITHROMAX) 250 MG tablet Take 2 tabs PO x 1 dose, then 1 tab PO QD x 4 days  . [DISCONTINUED] cetirizine (ZYRTEC) 10 MG tablet Take 1 tablet (10 mg total) by mouth at bedtime. During allergy season   No facility-administered encounter medications on file as of 09/20/2018.      History: Past Medical History:  Diagnosis Date  . Arthritis    "probably in my hands" (09/04/2013)  . 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 Cervical cancer ([place]) 1986  . Coronary atherosclerosis of native coronary artery 09/04/2013   RCA and obtuse marginal stent/DES-2010  . Fibromyalgia    "dx'd in 1994"  .  GERD (gastroesophageal reflux disease)   . Hyperlipidemia   . Hypertension   . Myocardial infarction ([place]) 07/2009   pt states that it was a minor heart attack, w/ no damage  . Obesity   . Shortness of breath    "just related to angina I was having recently" (09/04/2013)  . Type II diabetes mellitus ([place])    Past Surgical History:  Procedure Laterality Date  . CARPAL TUNNEL RELEASE Right 07/1992  . CERVICAL CONE BIOPSY  03/1985  . CORONARY ANGIOPLASTY WITH STENT PLACEMENT  07/2009, 08/2009; 09/04/2013   "1+1+2; total of 4" (09/04/2013)  . LEFT HEART CATHETERIZATION WITH CORONARY ANGIOGRAM N/A 09/04/2013   Procedure: LEFT HEART CATHETERIZATION WITH CORONARY ANGIOGRAM;  Surgeon: Candee Furbish, MD;  [facility] C[facility] LAB;   Service: Cardiovascular;  Laterality: N/A;  . MOHS SURGERY Left 05/2008   "nostril"  . SHOULDER OPEN ROTATOR CUFF REPAIR Right 10/2004   "got 5 pins in" (09/04/2013)  . TUBAL LIGATION  1979    Family History  Problem Relation Age of Onset  . Heart disease Mother 32       AMI; cause of death  . Hyperlipidemia Mother   . Cancer Father        Throat  . Hyperlipidemia Brother   . Hypertension Brother   . Drug abuse Brother   . Diabetes Daughter   . Drug abuse Daughter   . Heart disease Daughter   . Lupus Daughter   . Heart disease Paternal Grandmother   . Stroke Paternal Grandfather    Social History   Occupational History  . Not on file  Tobacco Use  . Smoking status: Former Smoker    Packs/day: 1.00    Years: 29.00    Pack years: 29.00    Types: Cigarettes    Last attempt to quit: 06/14/2006    Years since quitting: 12.2  . Smokeless tobacco: Never Used  Substance and Sexual Activity  . Alcohol use: No  . Drug use: No  . Sexual activity: Yes    Tobacco Counseling Counseling given: Not Answered   Immunizations and Health Maintenance Immunization History  Administered Date(s) Administered  . Influenza,inj,Quad PF,6+ Mos 08/20/2015  . Influenza-Unspecified 07/24/2013, 08/14/2014, 06/27/2018  . Pneumococcal Polysaccharide-23 08/20/2015  . Zoster 05/11/2014   Health Maintenance Due  Topic Date Due  . TETANUS/TDAP  03/24/1972  . MAMMOGRAM  03/25/2003  . PAP SMEAR  08/31/2017  . DEXA SCAN  03/24/2018  . PNA vac Low Risk Adult (1 of 2 - PCV13) 03/24/2018  . HEMOGLOBIN A1C  09/16/2018    Activities of Daily Living In your present state of health, do you have any difficulty performing the following activities: 09/20/2018  Hearing? N  Vision? N  Difficulty concentrating or making decisions? N  Walking or climbing stairs? N  Dressing or bathing? N  Doing errands, shopping? N  Some recent data might be hidden    Physical Exam  Physical Exam  Constitutional:  She is oriented to person, place, and time. She appears well-developed and well-nourished. No distress.  HENT:  Head: Normocephalic and atraumatic.  Right Ear: Tympanic membrane, external ear and ear canal normal.  Left Ear: Tympanic membrane, external ear and ear canal normal.  Nose: Nose normal. No mucosal edema or rhinorrhea.  Mouth/Throat: Uvula is midline, oropharynx is clear and moist and mucous membranes are normal. No posterior oropharyngeal erythema.  Eyes: Pupils are equal, round, and reactive to light. Conjunctivae and EOM are normal. Right eye  exhibits no discharge. Left eye exhibits no discharge. No scleral icterus.  Neck: Normal range of motion. Neck supple. No thyromegaly present.  Cardiovascular: Normal rate, regular rhythm, normal heart sounds and intact distal pulses.  Pulmonary/Chest: Effort normal and breath sounds normal. No respiratory distress.  Abdominal: Soft. Bowel sounds are normal. There is no tenderness.  Musculoskeletal: She exhibits no edema.  Lymphadenopathy:    She has no cervical adenopathy.  Neurological: She is alert and oriented to person, place, and time. She has normal reflexes.  Skin: Skin is warm and dry. She is not diaphoretic. No erythema.  Psychiatric: She has a normal mood and affect. Her behavior is normal.   EKG: NSR, no acute ischemic changes noted. No significant change noted when compared to prior EKG done 12/14/17.   I have personally reviewed the EKG tracing and agree with the computer interpretation. Sinus  Rhythm  WITHIN NORMAL LIMITS  Advanced Directives: Does Patient Have a Medical Advance Directive?: Yes Type of Advance Directive: Living will Does patient want to make changes to medical advance directive?: No - Patient declined    Assessment:    This is a routine wellness examination for this patient .   Vision/Hearing screen  Visual Acuity Screening   Right eye Left eye Both eyes  Without correction:     With correction:  20/20 20/20 20/20     Dietary issues and exercise activities discussed:     Goals   None   walking 35-40 min sev d/wk Depression Screen PHQ 2/9 Scores 09/20/2018 03/16/2018 02/13/2017 04/07/2016  PHQ - 2 Score 0 0 0 0     Fall Risk Fall Risk  09/20/2018  Falls in the past year? 0  Number falls in past yr: 0  Injury with Fall? 0  Comment -    Cognitive Function:     6CIT Screen 09/20/2018  What Year? 0 points  What month? 0 points  What time? 0 points  Count back from 20 0 points  Months in reverse 0 points  Repeat phrase 0 points  Total Score 0    Patient Care Team: Shawnee Knapp, MD as PCP - General (Family Medicine) Jettie Booze, MD as PCP - Cardiology (Cardiology) Himmelrich, Bryson Ha, [place] (Inactive) as Dietitian Jettie Booze, MD as Consulting Physician (Cardiology) Brien Few, MD as Consulting Physician (Obstetrics and Gynecology) Shirl Harris, [place] (Optometry)     Plan:    1. Welcome to Medicare preventive visit   2. Estrogen deficiency - had osteoporosis on free DEXA scan at wrist at a home show - fairly severe - so check dxa asap - risks of caucasian, petite, h/o tob use, - need to get full DEXA scan (along w/ mammogram) as will likely need treatment  3. Encounter for screening mammogram for breast cancer   4. Type 2 diabetes mellitus with other circulatory complication, without long-term current use of insulin (HCC) - Only taking 1 tab po qd of the Janumet - written for 2 tabs/qd to save pts money. Lab Results  Component Value Date   HGBA1C 5.5 10/08/2018   HGBA1C 5.9 03/16/2018   HGBA1C 6.1 02/13/2017    5. Mixed hyperlipidemia - goal LDL <70 w/ non-HDL <100 - at goal on atorvastatin 80 and zetia 10 Lab Results  Component Value Date   LDLCALC 59 09/20/2018   LDLCALC 44 03/16/2018   LDLCALC 37 08/11/2017     6. Essential hypertension - cont amlodipine 5, toprol 25, valsartan 320.  7.      H/o tobacco use - chart notes stopped in  2007 (12 yrs prior) with 29 pack year - discuss at f/u as if 30 pack years - pt will qualify for LDCT lung ca screening until 2022.  Orders Placed This Encounter  Procedures  . MM DIGITAL SCREENING BILATERAL    [employer]HC(MCR)    Standing Status:   Future    Standing Expiration Date:   11/21/2019    Scheduling Instructions:     sched at same time as DEXA bone scan    Order Specific Question:   Reason for Exam (SYMPTOM  OR DIAGNOSIS REQUIRED)    Answer:   screening for breast cancer    Order Specific Question:   Preferred imaging location?    Answer:   [facility]  . DG Bone Density    Standing Status:   Future    Standing Expiration Date:   11/21/2019    Scheduling Instructions:     sched at same time as mammogram    Order Specific Question:   Reason for Exam (SYMPTOM  OR DIAGNOSIS REQUIRED)    Answer:   estrogen deficiency    Order Specific Question:   Preferred imaging location?    Answer:   [facility]  . Pneumococcal conjugate vaccine 13-valent IM  . Comprehensive metabolic panel    Order Specific Question:   Has the patient fasted?    Answer:   Yes  . CBC with Differential/Platelet  . TSH  . Lipid panel    Order Specific Question:   Has the patient fasted?    Answer:   Yes  . POCT urinalysis dipstick  . EKG 12-Lead    Meds ordered this encounter  Medications  . SitaGLIPtin-MetFORMIN HCl 50-1000 MG TB24    Sig: Take 2 tablets by mouth every morning.    Dispense:  180 tablet    Refill:  1  . Zoster Vaccine Adjuvanted [facility]) injection    Sig: Inject 0.5 mLs into the muscle once for 1 dose. Repeat once in 2-6 months    Dispense:  0.5 mL    Refill:  1   I have personally reviewed and noted the following in the patient's chart:   . Medical and social history . Use of alcohol, tobacco or illicit drugs  . Current medications and supplements . Functional ability and status . Nutritional status . Physical activity . Advanced directives . List of other  physicians . Hospitalizations, surgeries, and ER visits in previous 12 months . Vitals . Screenings to include cognitive, depression, and falls . Referrals and appointments  In addition, I have reviewed and discussed with patient certain preventive protocols, quality metrics, and best practice recommendations. A written personalized care plan for preventive services as well as general preventive health recommendations were provided to patient.     Shawnee Knapp, MD 09/20/2018    Delman Cheadle, MD, MPH Primary Care at [place] [address] [place], [place]  [address] [phone] Office phone  [phone] Office fax   09/30/18 4:23 AM

## 2018-09-20 NOTE — Patient Instructions (Addendum)
You need to schedule your mammogram and your DEXA bone scan.  Please call [facility] at [phone] to schedule.     If you have lab work done today you will be contacted with your lab results within the next 2 weeks.  If you have not heard from [place] then please contact us. The fastest way to get your results is to register for My Chart.   IF you received an x-ray today, you will receive an invoice from [facility]. Please contact [facility] at [phone] with questions or concerns regarding your invoice.   IF you received labwork today, you will receive an invoice from [facility]. Please contact [facility] at [phone] with questions or concerns regarding your invoice.   Our billing staff will not be able to assist you with questions regarding bills from these companies.  You will be contacted with the lab results as soon as they are available. The fastest way to get your results is to activate your My Chart account. Instructions are located on the last page of this paperwork. If you have not heard from [place] regarding the results in 2 weeks, please contact this office.      Ms. Shedlock , Thank you for taking time to come for your Medicare Wellness Visit. I appreciate your ongoing commitment to your health goals. Please review the following plan we discussed and let me know if I can assist you in the future.   These are the goals we discussed: Goals   None     This is a list of the screening recommended for you and due dates:  Health Maintenance  Topic Date Due  . Tetanus Vaccine  03/24/1972  . Mammogram  03/25/2003  . Pap Smear  08/31/2017  . DEXA scan (bone density measurement)  03/24/2018  . Pneumonia vaccines (1 of 2 - PCV13) 03/24/2018  . Hemoglobin A1C  09/16/2018  . Colon Cancer Screening  09/21/2019*  . Complete foot exam   03/17/2019  . Eye exam for diabetics  05/29/2019  . Flu Shot  Completed  .  Hepatitis C: One time  screening is recommended by Center for Disease Control  (CDC) for  adults born from [birth year] through 1963.   Completed  . HIV Screening  Completed  *Topic was postponed. The date shown is not the original due date.   Bone Health Bones protect organs, store calcium, and anchor muscles. Good health habits, such as eating nutritious foods and exercising regularly, are important for maintaining healthy bones. They can also help to prevent a condition that causes bones to lose density and become weak and brittle (osteoporosis). Why is bone mass important? Bone mass refers to the amount of bone tissue that you have. The higher your bone mass, the stronger your bones. An important step toward having healthy bones throughout life is to have strong and dense bones during childhood. A young adult who has a high bone mass is more likely to have a high bone mass later in life. Bone mass at its greatest it is called peak bone mass. A large decline in bone mass occurs in older adults. In women, it occurs about the time of menopause. During this time, it is important to practice good health habits, because if more bone is lost than what is replaced, the bones will become less healthy and more likely to break (fracture). If you find that you have a low bone mass, you may be able to prevent osteoporosis or  further bone loss by changing your diet and lifestyle. How can I find out if my bone mass is low? Bone mass can be measured with an X-ray test that is called a bone mineral density (BMD) test. This test is recommended for all women who are age 65 or older. It may also be recommended for men who are age 65 or older, or for people who are more likely to develop osteoporosis due to:  Having bones that break easily.  Having a long-term disease that weakens bones, such as kidney disease or rheumatoid arthritis.  Having menopause earlier than normal.  Taking medicine that weakens bones, such as steroids, thyroid hormones,  or hormone treatment for breast cancer or prostate cancer.  Smoking.  Drinking three or more alcoholic drinks each day.  What are the nutritional recommendations for healthy bones? To have healthy bones, you need to get enough of the right minerals and vitamins. Most nutrition experts recommend getting these nutrients from the foods that you eat. Nutritional recommendations vary from person to person. Ask your health care provider what is healthy for you. Here are some general guidelines. Calcium Recommendations Calcium is the most important (essential) mineral for bone health. Most people can get enough calcium from their diet, but supplements may be recommended for people who are at risk for osteoporosis. Good sources of calcium include:  Dairy products, such as low-fat or nonfat milk, cheese, and yogurt.  Dark green leafy vegetables, such as bok choy and broccoli.  Calcium-fortified foods, such as orange juice, cereal, bread, soy beverages, and tofu products.  Nuts, such as almonds.  Follow these recommended amounts for daily calcium intake:  Children, age 25?3: 700 mg.  Children, age 23?8: 1,000 mg.  Children, age 39?13: 1,300 mg.  Teens, age 65?18: 1,300 mg.  Adults, age 65?50: 1,000 mg.  Adults, age 652?70: ? Men: 1,000 mg. ? Women: 1,200 mg.  Adults, age 65 or older: 1,200 mg.  Pregnant and breastfeeding females: ? Teens: 1,300 mg. ? Adults: 1,000 mg.  Vitamin D Recommendations Vitamin D is the most essential vitamin for bone health. It helps the body to absorb calcium. Sunlight stimulates the skin to make vitamin D, so be sure to get enough sunlight. If you live in a cold climate or you do not get outside often, your health care provider may recommend that you take vitamin D supplements. Good sources of vitamin D in your diet include:  Egg yolks.  Saltwater fish.  Milk and cereal fortified with vitamin D.  Follow these recommended amounts for daily vitamin D  intake:  Children and teens, age 65?18: 48 international units.  Adults, age 65 or younger: 400-800 international units.  Adults, age 65 or older: 800-1,000 international units.  Other Nutrients Other nutrients for bone health include:  Phosphorus. This mineral is found in meat, poultry, dairy foods, nuts, and legumes. The recommended daily intake for adult men and adult women is 700 mg.  Magnesium. This mineral is found in seeds, nuts, dark green vegetables, and legumes. The recommended daily intake for adult men is 400?420 mg. For adult women, it is 310?320 mg.  Vitamin K. This vitamin is found in green leafy vegetables. The recommended daily intake is 120 mg for adult men and 90 mg for adult women.  What type of physical activity is best for building and maintaining healthy bones? Weight-bearing and strength-building activities are important for building and maintaining peak bone mass. Weight-bearing activities cause muscles and bones to work against  gravity. Strength-building activities increases muscle strength that supports bones. Weight-bearing and muscle-building activities include:  Walking and hiking.  Jogging and running.  Dancing.  Gym exercises.  Lifting weights.  Tennis and racquetball.  Climbing stairs.  Aerobics.  Adults should get at least 30 minutes of moderate physical activity on most days. Children should get at least 60 minutes of moderate physical activity on most days. Ask your health care provide what type of exercise is best for you. Where can I find more information? For more information, check out the following websites:  [place]: YardHomes.se  [employer] of Health: http://www.niams.AnonymousEar.fr.asp  This information is not intended to replace advice given to you by your health care provider. Make sure you discuss any questions you have with your health  care provider. Document Released: 01/07/2004 Document Revised: 05/06/2016 Document Reviewed: 10/22/2014 Elsevier Interactive Patient Education  2018 [employer].  Calcium Intake Recommendations Calcium is a mineral that affects many functions in the body, including:  Blood clotting.  Blood vessel function.  Nerve impulse conduction.  Hormone secretion.  Muscle contraction.  Bone and teeth functions.  Most of your body's calcium supply is stored in your bones and teeth. When your calcium stores are low, you may be at risk for low bone mass, bone loss, and bone fractures. Consuming enough calcium helps to grow healthy bones and teeth and to prevent breakdown over time. It is very important that you get enough calcium if you are:  A child undergoing rapid growth.  An adolescent girl.  A pre- or post-menopausal woman.  A woman whose menstrual cycle has stopped due to anorexia nervosa or regular intense exercise.  An individual with lactose intolerance or a milk allergy.  A vegetarian.  What is my plan? Try to consume the recommended amount of calcium daily based on your age. Depending on your overall health, your health care provider may recommend increased calcium intake.General daily calcium intake recommendations by age are:  Birth to 6 months: 200 mg.  Infants 7 to 12 months: 260 mg.  Children 1 to 3 years: 700 mg.  Children 4 to 8 years: 1,000 mg.  Children 9 to 13 years: 1,300 mg.  Teens 14 to 18 years: 1,300 mg.  Adults 19 to 50 years: 1,000 mg.  Adult women 51 to 70 years: 1,200 mg.  Adult men 51 to 70 years: 1,000 mg.  Adults 71 years and older: 1,200 mg.  Pregnant and breastfeeding teens: 1,300 mg.  Pregnant and breastfeeding adults: 1,000 mg.  What do I need to know about calcium intake?  In order for the body to absorb calcium, it needs vitamin D. You can get vitamin D through: ? Direct exposure of the skin to sunlight. ? Foods, such as egg  yolks, liver, saltwater fish, and fortified milk. ? Supplements.  Consuming too much calcium may cause: ? Constipation. ? Decreased absorption of iron and zinc. ? Kidney stones.  Calcium supplements may interact with certain medicines. Check with your health care provider before starting any calcium supplements.  Try to get most of your calcium from food. What foods can I eat? Grains  Fortified oatmeal. Fortified ready-to-eat cereals. Fortified frozen waffles. Vegetables Turnip greens. Broccoli. Fruits Fortified orange juice. Meats and Other Protein Sources Canned sardines with bones. Canned salmon with bones. Soy beans. Tofu. Baked beans. Almonds. [place] nuts. Sunflower seeds. Dairy Milk. Yogurt. Cheese. Cottage cheese. Beverages Fortified soy milk. Fortified rice milk. Sweets/Desserts Pudding. Ice Cream. Milkshakes. Blackstrap molasses. The  items listed above may not be a complete list of recommended foods or beverages. Contact your dietitian for more options. What foods can affect my calcium intake? It may be more difficult for your body to use calcium or calcium may leave your body more quickly if you consume large amounts of:  Sodium.  Protein.  Caffeine.  Alcohol.  This information is not intended to replace advice given to you by your health care provider. Make sure you discuss any questions you have with your health care provider. Document Released: 05/31/2004 Document Revised: 05/06/2016 Document Reviewed: 03/25/2014 Elsevier Interactive Patient Education  2018 [employer].

## 2018-09-21 LAB — COMPREHENSIVE METABOLIC PANEL
ALBUMIN: 4.8 g/dL (ref 3.6–4.8)
ALK PHOS: 96 IU/L (ref 39–117)
ALT: 34 IU/L — ABNORMAL HIGH (ref 0–32)
AST: 35 IU/L (ref 0–40)
Albumin/Globulin Ratio: 2.1 (ref 1.2–2.2)
BILIRUBIN TOTAL: 0.4 mg/dL (ref 0.0–1.2)
BUN / CREAT RATIO: 21 (ref 12–28)
BUN: 21 mg/dL (ref 8–27)
CHLORIDE: 96 mmol/L (ref 96–106)
CO2: 21 mmol/L (ref 20–29)
Calcium: 10.1 mg/dL (ref 8.7–10.3)
Creatinine, Ser: 1.01 mg/dL — ABNORMAL HIGH (ref 0.57–1.00)
GFR calc non Af Amer: 59 mL/min/{1.73_m2} — ABNORMAL LOW (ref >59)
GFR, EST AFRICAN AMERICAN: 68 mL/min/{1.73_m2} (ref >59)
GLOBULIN, TOTAL: 2.3 g/dL (ref 1.5–4.5)
GLUCOSE: 154 mg/dL — AB (ref 65–99)
Potassium: 5.2 mmol/L (ref 3.5–5.2)
Sodium: 136 mmol/L (ref 134–144)
Total Protein: 7.1 g/dL (ref 6.0–8.5)

## 2018-09-21 LAB — CBC WITH DIFFERENTIAL/PLATELET
BASOS ABS: 0.1 10*3/uL (ref 0.0–0.2)
Basos: 1 %
EOS (ABSOLUTE): 0.2 10*3/uL (ref 0.0–0.4)
Eos: 3 %
HEMATOCRIT: 38.6 % (ref 34.0–46.6)
Hemoglobin: 13.2 g/dL (ref 11.1–15.9)
Immature Grans (Abs): 0 10*3/uL (ref 0.0–0.1)
Immature Granulocytes: 0 %
LYMPHS ABS: 1.4 10*3/uL (ref 0.7–3.1)
Lymphs: 17 %
MCH: 31.5 pg (ref 26.6–33.0)
MCHC: 34.2 g/dL (ref 31.5–35.7)
MCV: 92 fL (ref 79–97)
Monocytes Absolute: 0.7 10*3/uL (ref 0.1–0.9)
Monocytes: 8 %
NEUTROS ABS: 6.1 10*3/uL (ref 1.4–7.0)
Neutrophils: 71 %
Platelets: 250 10*3/uL (ref 150–450)
RBC: 4.19 x10E6/uL (ref 3.77–5.28)
RDW: 12.8 % (ref 12.3–15.4)
WBC: 8.5 10*3/uL (ref 3.4–10.8)

## 2018-09-21 LAB — LIPID PANEL
CHOL/HDL RATIO: 3.9 ratio (ref 0.0–4.4)
CHOLESTEROL TOTAL: 140 mg/dL (ref 100–199)
HDL: 36 mg/dL — AB (ref >39)
LDL Calculated: 59 mg/dL (ref 0–99)
TRIGLYCERIDES: 227 mg/dL — AB (ref 0–149)
VLDL CHOLESTEROL CAL: 45 mg/dL — AB (ref 5–40)

## 2018-09-21 LAB — TSH: TSH: 5.63 u[IU]/mL — ABNORMAL HIGH (ref 0.450–4.500)

## 2018-09-28 ENCOUNTER — Encounter (HOSPITAL_COMMUNITY): Payer: Self-pay | Admitting: Emergency Medicine

## 2018-09-28 ENCOUNTER — Ambulatory Visit: Payer: Self-pay

## 2018-09-28 ENCOUNTER — Emergency Department (HOSPITAL_COMMUNITY)
Admission: EM | Admit: 2018-09-28 | Discharge: 2018-09-28 | Disposition: A | Discharge status: Home / Self Care | Payer: Medicare Other | Attending: Emergency Medicine | Admitting: Emergency Medicine

## 2018-09-28 ENCOUNTER — Other Ambulatory Visit: Payer: Self-pay

## 2018-09-28 ENCOUNTER — Emergency Department (HOSPITAL_COMMUNITY): Payer: Medicare Other

## 2018-09-28 DIAGNOSIS — I10 Essential (primary) hypertension: Secondary | ICD-10-CM | POA: Insufficient documentation

## 2018-09-28 DIAGNOSIS — I252 Old myocardial infarction: Secondary | ICD-10-CM | POA: Insufficient documentation

## 2018-09-28 DIAGNOSIS — Y929 Unspecified place or not applicable: Secondary | ICD-10-CM | POA: Diagnosis not present

## 2018-09-28 DIAGNOSIS — Y9301 Activity, walking, marching and hiking: Secondary | ICD-10-CM | POA: Insufficient documentation

## 2018-09-28 DIAGNOSIS — S89391A Other physeal fracture of lower end of right fibula, initial encounter for closed fracture: Secondary | ICD-10-CM | POA: Diagnosis not present

## 2018-09-28 DIAGNOSIS — Z87891 Personal history of nicotine dependence: Secondary | ICD-10-CM | POA: Diagnosis not present

## 2018-09-28 DIAGNOSIS — Z8541 Personal history of malignant neoplasm of cervix uteri: Secondary | ICD-10-CM | POA: Diagnosis not present

## 2018-09-28 DIAGNOSIS — I2510 Atherosclerotic heart disease of native coronary artery without angina pectoris: Secondary | ICD-10-CM | POA: Insufficient documentation

## 2018-09-28 DIAGNOSIS — W1830XA Fall on same level, unspecified, initial encounter: Secondary | ICD-10-CM | POA: Diagnosis not present

## 2018-09-28 DIAGNOSIS — Z85828 Personal history of other malignant neoplasm of skin: Secondary | ICD-10-CM | POA: Insufficient documentation

## 2018-09-28 DIAGNOSIS — S82831A Other fracture of upper and lower end of right fibula, initial encounter for closed fracture: Secondary | ICD-10-CM

## 2018-09-28 DIAGNOSIS — Y999 Unspecified external cause status: Secondary | ICD-10-CM | POA: Diagnosis not present

## 2018-09-28 DIAGNOSIS — Z79899 Other long term (current) drug therapy: Secondary | ICD-10-CM | POA: Diagnosis not present

## 2018-09-28 DIAGNOSIS — S99911A Unspecified injury of right ankle, initial encounter: Secondary | ICD-10-CM | POA: Diagnosis present

## 2018-09-28 NOTE — ED Notes (Signed)
Patient verbalizes understanding of discharge instructions. Opportunity for questioning and answers were provided. Armband removed by staff, pt discharged from ED. Pt wheeled to lobby and taken home by family 

## 2018-09-28 NOTE — ED Triage Notes (Signed)
Pt. Stated, I was out walking and I turned my right ankle, I iced it ad wrapped it ad its no better.

## 2018-09-28 NOTE — Telephone Encounter (Signed)
Pt being seen in the ER on 09/28/2018.

## 2018-09-28 NOTE — Discharge Instructions (Addendum)
Please read attached information. If you experience any new or worsening signs or symptoms please return to the emergency room for evaluation. Please follow-up with your primary care provider or specialist as discussed.  °

## 2018-09-28 NOTE — ED Notes (Signed)
Ortho tech at bedside 

## 2018-09-28 NOTE — Telephone Encounter (Signed)
Pt c/o right ankle pain, swelling and bruising to the right ankle. Pt was walking yesterday and foot slipped off the side of the road into some leaves. She thought she was going to fall head first and twisted her body and her right ankle absorbed the fall. Pt stated that she cannot bear weight on that foot.. Pt stated that she has severe swelling to the ankle and foot and bruising with a small abrasion to the right lateral side of the ankle. Pt stated her pain while sitting is mild, but when attempting to stand on it, her pain is severe. Last tetanus was when she was 67 years old. Pt iced her ankle, elevated it, her husband wrapped in Ace wrap. Pt advised to go to the ED for evaluation. Pt stated she will be going to [facility] ED. Care advice given and pt verbalized understanding.    Reason for Disposition . Can't stand (bear weight) or walk  Answer Assessment - Initial Assessment Questions 1. LOCATION: "Which joint is swollen?"     Right ankle 2. ONSET: "When did the swelling start?"     Last night 3. SIZE: "How large is the swelling?"     "pretty puffy" 4. PAIN: "Is there any pain?" If so, ask: "How bad is it?" (Scale 1-10; or mild, moderate, severe)     Sitting mild sharp ache when standing on it sharp and achy 5. CAUSE: "What do you think caused the swollen joint?"     fall 6. OTHER SYMPTOMS: "Do you have any other symptoms?" (e.g., fever, chest pain, difficulty breathing, calf pain)     no 7. PREGNANCY: "Is there any chance you are pregnant?" "When was your last menstrual period?"     n/a  Answer Assessment - Initial Assessment Questions 1. MECHANISM: "How did the injury happen?" (e.g., twisting injury, direct blow)      Pt was walking and foot slipped off the side of the pavement  2. ONSET: "When did the injury happen?" (Minutes or hours ago)      yesterday 3. LOCATION: "Where is the injury located?"      Right ankle 4. APPEARANCE of INJURY: "What does the injury look like?"   Pt stated that foot and ankle is very swollen  5. WEIGHT-BEARING: "Can you put weight on that foot?" "Can you walk (four steps or more)?"    no 6. SIZE: For cuts, bruises, or swelling, ask: "How large is it?" (e.g., inches or centimeters;  entire joint)      Small abrasion outside of leg, bruising to the lateral ankle 7. PAIN: "Is there pain?" If so, ask: "How bad is the pain?"    (e.g., Scale 1-10; or mild, moderate, severe)     Mild pain with sitting or at rest. Severe pain and unable to put weight on the ankle 8. TETANUS: For any breaks in the skin, ask: "When was the last tetanus booster?"     When she was 16 9. OTHER SYMPTOMS: "Do you have any other symptoms?"      no 10. PREGNANCY: "Is there any chance you are pregnant?" "When was your last menstrual period?"       no  Protocols used: FOOT AND ANKLE [place], ANKLE SWELLING-A-AH

## 2018-09-28 NOTE — Progress Notes (Signed)
Orthopedic Tech Progress Note Patient Details:  Suzanne Watkins [birth year] [id number]  Ortho Devices Type of Ortho Device: Post (short leg) splint Ortho Device/Splint Location: rle Ortho Device/Splint Interventions: Ordered, Application, Adjustment   Post Interventions Patient Tolerated: Well Instructions Provided: Care of device, Adjustment of device   Karolee Stamps 09/28/2018, 11:51 AM

## 2018-09-28 NOTE — ED Provider Notes (Addendum)
[facility] EMERGENCY DEPARTMENT Provider Note   CSN: [id number] Arrival date & time: 09/28/18  4270     History   Chief Complaint Chief Complaint  Patient presents with  . Ankle Pain    HPI Suzanne Watkins is a 67 y.o. female.  HPI   67 year old female presents today with complaints of right-sided ankle pain.  Patient notes last night she was walking when she slipped causing a injury to the right lateral ankle.  She is uncertain which way her foot rolled in.  She notes painful ambulation to the point where she has had to use a crutch at home.  She notes taking ibuprofen with minor improvement in symptoms.  She denies any other injuries including knee or foot pain.  No history of significant injury to her foot or ankle previously.  Past Medical History:  Diagnosis Date  . Arthritis    "probably in my hands" (09/04/2013)  . 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 Cervical cancer ([place]) 1986  . Coronary atherosclerosis of native coronary artery 09/04/2013   RCA and obtuse marginal stent/DES-2010  . Fibromyalgia    "dx'd in 1994"  . GERD (gastroesophageal reflux disease)   . Hyperlipidemia   . Hypertension   . Myocardial infarction ([place]) 07/2009   pt states that it was a minor heart attack, w/ no damage  . Obesity   . Shortness of breath    "just related to angina I was having recently" (09/04/2013)  . Type II diabetes mellitus [facility])     Patient Active Problem List   Diagnosis Date Noted  . Hypertensive heart disease 12/14/2017  . Unstable angina ([place]) 09/04/2013  . Coronary atherosclerosis of native coronary artery 09/04/2013  . 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 Hypertension   . Hyperlipidemia   . GERD (gastroesophageal reflux disease)   . Obesity   . Fibromyalgia   . Diabetes ([place]) 05/18/2013    Past Surgical History:  Procedure Laterality Date  . CARPAL TUNNEL RELEASE Right 07/1992  . CERVICAL CONE BIOPSY  03/1985  . CORONARY  ANGIOPLASTY WITH STENT PLACEMENT  07/2009, 08/2009; 09/04/2013   "1+1+2; total of 4" (09/04/2013)  . LEFT HEART CATHETERIZATION WITH CORONARY ANGIOGRAM N/A 09/04/2013   Procedure: LEFT HEART CATHETERIZATION WITH CORONARY ANGIOGRAM;  Surgeon: Candee Furbish, MD;  Location: [facility] CATH LAB;  Service: Cardiovascular;  Laterality: N/A;  . MOHS SURGERY Left 05/2008   "nostril"  . SHOULDER OPEN ROTATOR CUFF REPAIR Right 10/2004   "got 5 pins in" (09/04/2013)  . TUBAL LIGATION  1979     OB History   None      Home Medications    Prior to Admission medications   Medication Sig Start Date End Date Taking? Authorizing Provider  amLODipine (NORVASC) 5 MG tablet TAKE 1 TABLET BY MOUTH DAILY 02/27/18   Jettie Booze, MD  atorvastatin (LIPITOR) 80 MG tablet Take 1 tablet (80 mg total) by mouth daily. 03/01/18   Jettie Booze, MD  BRILINTA 90 MG TABS tablet TAKE 1 TABLET BY MOUTH TWICE DAILY 12/26/17   Jettie Booze, MD  diphenhydrAMINE (BENADRYL) 50 MG capsule Take 100 mg by mouth at bedtime.     [provider]  ezetimibe (ZETIA) 10 MG tablet TAKE 1 TABLET BY MOUTH EVERY DAY 12/26/17   Jettie Booze, MD  glucose blood test strip Use as instructed. One touch ultra meter. 02/13/17   Shawnee Knapp, MD  metoprolol succinate (TOPROL-XL) 25 MG  24 hr tablet Take 1 tablet (25 mg total) by mouth daily. 03/01/18   Jettie Booze, MD  Multiple Vitamin (MULTIVITAMIN WITH MINERALS) TABS tablet Take 1 tablet by mouth daily.    [provider]  nitroGLYCERIN (NITROSTAT) 0.4 MG SL tablet Place 1 tablet (0.4 mg total) under the tongue every 5 (five) minutes as needed for chest pain. 12/09/16   Jettie Booze, MD  omeprazole (PRILOSEC) 20 MG capsule Take 20 mg by mouth daily.    [provider]  ONE TOUCH ULTRA TEST test strip USE AS DIRECTED 09/24/16   Shawnee Knapp, MD  SitaGLIPtin-MetFORMIN HCl 50-1000 MG TB24 Take 2 tablets by mouth every morning. 09/20/18   Shawnee Knapp,  MD  valsartan (DIOVAN) 320 MG tablet TAKE 1 TABLET(320 MG) BY MOUTH DAILY 03/16/18   Shawnee Knapp, MD    Family History Family History  Problem Relation Age of Onset  . Heart disease Mother 80       AMI; cause of death  . Hyperlipidemia Mother   . Cancer Father        Throat  . Hyperlipidemia Brother   . Hypertension Brother   . Drug abuse Brother   . Diabetes Daughter   . Drug abuse Daughter   . Heart disease Daughter   . Lupus Daughter   . Heart disease Paternal Grandmother   . Stroke Paternal Grandfather     Social History Social History   Tobacco Use  . Smoking status: Former Smoker    Packs/day: 1.00    Years: 29.00    Pack years: 29.00    Types: Cigarettes    Last attempt to quit: 06/14/2006    Years since quitting: 12.2  . Smokeless tobacco: Never Used  Substance Use Topics  . Alcohol use: No  . Drug use: No     Allergies   Plavix [clopidogrel bisulfate]   Review of Systems Review of Systems  All other systems reviewed and are negative.    Physical Exam Updated Vital Signs BP 138/80 (BP Location: Right Arm)   Pulse 83   Temp 98.4 F (36.9 C) (Oral)   Resp 17   SpO2 96%   Physical Exam  Constitutional: She is oriented to person, place, and time. She appears well-developed and well-nourished.  HENT:  Head: Normocephalic and atraumatic.  Eyes: Pupils are equal, round, and reactive to light. Conjunctivae are normal. Right eye exhibits no discharge. Left eye exhibits no discharge. No scleral icterus.  Neck: Normal range of motion. No JVD present. No tracheal deviation present.  Pulmonary/Chest: Effort normal. No stridor.  Musculoskeletal:  Swelling noted at the right lateral ankle, no open wounds small abrasion noted to lateral aspect, no significant laxity pedal pulse 2+ sensation intact cap refill intact no tenderness palpation of the medial malleolus, knee or remainder of extremity.  Foot nontender-   Neurological: She is alert and oriented to  person, place, and time. Coordination normal.  Psychiatric: She has a normal mood and affect. Her behavior is normal. Judgment and thought content normal.  Nursing note and vitals reviewed.    ED Treatments / Results  Labs (all labs ordered are listed, but only abnormal results are displayed) Labs Reviewed - No data to display  EKG None  Radiology Dg Ankle Complete Right  Result Date: 09/28/2018 CLINICAL DATA:  Fall. EXAM: RIGHT ANKLE - COMPLETE 3+ VIEW COMPARISON:  No recent prior. FINDINGS: Soft tissue swelling noted over the lateral malleolus. Comminuted oblique  fracture noted of the distal aspect of the right fibular diaphysis. Tiny bony densities noted adjacent to the medial malleolus, these could be tiny avulsion fractures. IMPRESSION: Comminuted oblique fracture of the distal aspect of the right fibular diaphysis. Tiny bony densities noted adjacent to the medial malleolus, these could represent tiny avulsion fractures Electronically Signed   By: Marcello Moores  Register   On: 09/28/2018 09:56    Procedures Procedures (including critical care time)  SPLINT APPLICATION Date/Time: 54:98 AM Authorized by: Stevie Kern Darus Hershman Consent: Verbal consent obtained. Risks and benefits: risks, benefits and alternatives were discussed Consent given by: patient Splint applied by: orthopedic technician Location details: right ankle Splint type: posterior  Supplies used: orthoglass Post-procedure: The splinted body part was neurovascularly unchanged following the procedure. Patient tolerance: Patient tolerated the procedure well with no immediate complications.     Medications Ordered in ED Medications - No data to display   Initial Impression / Assessment and Plan / ED Course  I have reviewed the triage vital signs and the nursing notes.  Pertinent labs & imaging results that were available during my care of the patient were reviewed by me and considered in my medical decision making  (see chart for details).     67 year old female presents today with distal fibular fracture.  No open wounds.  Patient did have a small abrasion on the lateral aspect, she notes no bleeding from this area.  There are no bony fragments located around this area have low suspicion for open fracture.  No signs of compartment syndrome.  Patient be placed in a splint, she has crutches, nonweightbearing until follow-up with orthopedic specialist.  Return precautions given.  She verbalized understanding and agreement to today's plan.  Final Clinical Impressions(s) / ED Diagnoses   Final diagnoses:  Closed fracture of distal end of right fibula, unspecified fracture morphology, initial encounter    ED Discharge Orders    None          Francee Gentile 09/28/18 1110    Davonna Belling, MD 09/29/18 [phone]

## 2018-09-28 NOTE — ED Notes (Signed)
Ortho paged for splint 

## 2018-10-02 ENCOUNTER — Telehealth: Payer: Self-pay | Admitting: *Deleted

## 2018-10-02 NOTE — Telephone Encounter (Signed)
   [facility] Pre-operative Risk Assessment    Request for surgical clearance:  1. What type of surgery is being performed? ORIF RIGHT ANKLE   2. When is this surgery scheduled? TBD   3. Are there any medications that need to be held prior to surgery and how long? BRILINTA   4. Practice name and name of physician performing surgery? [facility]; DR. Lyla Glassing    5. What is your office phone and fax number? PH# [phone]; FAX# [phone] ATTN: LAURIE FAUST  6. Anesthesia type (None, local, MAC, general) ? CALLED ORTHO TO VERIFY TYPE ANESTHESIA BEING USED  7. [facility] REQUEST RECENT OV NOTE, EKG, CBC, BMP, ALBUMIN, Hgb E3X,VQ PT/INR IF APPLICABLE , CXR   Julaine Hua 10/02/2018, 4:17 PM  _________________________________________________________________   (provider comments below)

## 2018-10-02 NOTE — Telephone Encounter (Signed)
Dr. Noralyn Pick office called to let us know that pt will have General Anesthesia with a Block for her procedure.

## 2018-10-04 NOTE — Patient Instructions (Addendum)
Suzanne Watkins  07/21/1951   Your procedure is scheduled on:  10-10-2018   Report to [facility] Main  Entrance,  Report to admitting at  12:45 PM    Call this number if you have problems the morning of surgery [phone]        Remember: Do not eat food After Midnight.   Clear liquid diet from midnight until 8:30 AM day of surgery .  After 8:30 AM nothing by mouth including candy,gum,mints                                     BRUSH YOUR TEETH MORNING OF SURGERY AND RINSE YOUR MOUTH OUT          Take these medicines the morning of surgery with A SIP OF WATER:    Amlodipine, Metoprolol, Omeprazole (prilosec)                                                                                                                                DO NOT TAKE ANY DIABETIC MEDICATIONS DAY OF YOUR SURGERY                                     You may not have any metal on your body including hair pins and  piercings              Do not wear jewelry, make-up, lotions, powders or perfumes, deodorant               Do not wear nail polish.  Do not shave  48 hours prior to surgery.                      Do not bring valuables to the hospital. [place].  Contacts, dentures or bridgework may not be worn into surgery.  Leave suitcase in the car. After surgery it may be brought to your room.    Special Instructions: N/A                _____________________________________________________________________     CLEAR LIQUID DIET   Foods Allowed                                                                     Foods Excluded  Coffee and tea, regular and decaf  liquids that you cannot  Plain Jell-O in any flavor                                             see through such as: Fruit ices (not with fruit pulp)                                     milk, soups, orange juice  Iced  Popsicles                                    All solid food Carbonated beverages, regular and diet                                    Cranberry, grape and apple juices Sports drinks like Gatorade Lightly seasoned clear broth or consume(fat free) Sugar, honey syrup  Sample Menu Breakfast                                Lunch                                     Supper Cranberry juice                    Beef broth                            Chicken broth Jell-O                                     Grape juice                           Apple juice Coffee or tea                        Jell-O                                      Popsicle                                                Coffee or tea                        Coffee or tea  _____________________________________________________________________            [facility] - Preparing for Surgery Before surgery, you can play an important role.  Because skin is not sterile, your skin needs to be as free of germs as possible.  You can reduce the number of germs on your skin by washing with CHG (chlorahexidine gluconate) soap  before surgery.  CHG is an antiseptic cleaner which kills germs and bonds with the skin to continue killing germs even after washing. Please DO NOT use if you have an allergy to CHG or antibacterial soaps.  If your skin becomes reddened/irritated stop using the CHG and inform your nurse when you arrive at Short Stay. Do not shave (including legs and underarms) for at least 48 hours prior to the first CHG shower.  You may shave your face/neck. Please follow these instructions carefully:  1.  Shower with CHG Soap the night before surgery and the  morning of Surgery.  2.  If you choose to wash your hair, wash your hair first as usual with your  normal  shampoo.  3.  After you shampoo, rinse your hair and body thoroughly to remove the  shampoo.                            4.  Use CHG as you would any other liquid soap.  You can  apply chg directly  to the skin and wash                       Gently with a scrungie or clean washcloth.  5.  Apply the CHG Soap to your body ONLY FROM THE NECK DOWN.   Do not use on face/ open                           Wound or open sores. Avoid contact with eyes, ears mouth and genitals (private parts).                       Wash face,  Genitals (private parts) with your normal soap.             6.  Wash thoroughly, paying special attention to the area where your surgery  will be performed.  7.  Thoroughly rinse your body with warm water from the neck down.  8.  DO NOT shower/wash with your normal soap after using and rinsing off  the CHG Soap.             9.  Pat yourself dry with a clean towel.            10.  Wear clean pajamas.            11.  Place clean sheets on your bed the night of your first shower and do not  sleep with pets. Day of Surgery : Do not apply any lotions/deodorants the morning of surgery.  Please wear clean clothes to the hospital/surgery center.  FAILURE TO FOLLOW THESE INSTRUCTIONS MAY RESULT IN THE CANCELLATION OF YOUR SURGERY PATIENT SIGNATURE_________________________________  NURSE SIGNATURE__________________________________  ________________________________________________________________________

## 2018-10-04 NOTE — Telephone Encounter (Signed)
   [place], MD  Chart reviewed as part of pre-operative protocol coverage. H/o CAD with multiple stents, last in 2014 per Dr. Hassell Done note. Will route to Dr. Irish Lack for input on holding Brilinta as requested (currently on monotherapy). Dr. Irish Lack - Please route response to P CV DIV PREOP (the pre-op pool, not me directly). Thank you.   Charlie Pitter, PA-C  10/04/2018, 5:12 PM

## 2018-10-04 NOTE — Telephone Encounter (Signed)
OK to hold Brilinta 5 days prior to surgery.  Restart Brilinta post op when safe from a bleeding standpoint.

## 2018-10-05 ENCOUNTER — Telehealth: Payer: Self-pay | Admitting: Family Medicine

## 2018-10-05 NOTE — Telephone Encounter (Signed)
Copied from [place] [id number]. Topic: General - Inquiry >> Oct 05, 2018 10:51 AM Conception Chancy, NT wrote: Reason for CRM: patient husband is calling and and states that he dropped a form off at the office on 10/03/18 that needs to be filled out and faxed to [facility]. He states she has surgery on 10/10/18 and he contacted that office and they have not received it. He would like a status update.

## 2018-10-05 NOTE — Telephone Encounter (Signed)
   Primary Cardiologist: Larae Grooms, MD  Chart reviewed as part of pre-operative protocol coverage. Patient was contacted 10/05/2018 in reference to pre-operative risk assessment for pending surgery as outlined below.  Cala Kruckenberg was last seen on 12/14/17 by Dr. Irish Lack.  Since that day, Michael Ventresca has done well. She has been walking for 45 minutes 7 days per week no significant exertional symptoms. Walking is how she injured her ankle, stepping off a curb.   Therefore, based on ACC/AHA guidelines, the patient would be at acceptable risk for the planned procedure without further cardiovascular testing.   Per Dr. Irish Lack it is OK to hold Brilinta 5 days prior to surgery.  Restart Brilinta post op when safe from a bleeding standpoint.  I will route this recommendation to the requesting party via Epic fax function and remove from pre-op pool.  Please call with questions.  Daune Perch, NP 10/05/2018, 9:44 AM

## 2018-10-08 ENCOUNTER — Encounter (HOSPITAL_COMMUNITY): Payer: Self-pay

## 2018-10-08 ENCOUNTER — Ambulatory Visit: Payer: Self-pay | Admitting: Orthopedic Surgery

## 2018-10-08 ENCOUNTER — Other Ambulatory Visit: Payer: Self-pay

## 2018-10-08 ENCOUNTER — Encounter (HOSPITAL_COMMUNITY)
Admission: RE | Admit: 2018-10-08 | Discharge: 2018-10-08 | Disposition: A | Discharge status: Home / Self Care | Payer: Medicare Other | Source: Ambulatory Visit | Attending: Orthopedic Surgery | Admitting: Orthopedic Surgery

## 2018-10-08 DIAGNOSIS — Z87891 Personal history of nicotine dependence: Secondary | ICD-10-CM | POA: Insufficient documentation

## 2018-10-08 DIAGNOSIS — E119 Type 2 diabetes mellitus without complications: Secondary | ICD-10-CM

## 2018-10-08 DIAGNOSIS — E785 Hyperlipidemia, unspecified: Secondary | ICD-10-CM | POA: Insufficient documentation

## 2018-10-08 DIAGNOSIS — Z01812 Encounter for preprocedural laboratory examination: Secondary | ICD-10-CM | POA: Insufficient documentation

## 2018-10-08 DIAGNOSIS — I2510 Atherosclerotic heart disease of native coronary artery without angina pectoris: Secondary | ICD-10-CM | POA: Diagnosis not present

## 2018-10-08 DIAGNOSIS — M25571 Pain in right ankle and joints of right foot: Secondary | ICD-10-CM

## 2018-10-08 DIAGNOSIS — E669 Obesity, unspecified: Secondary | ICD-10-CM | POA: Diagnosis not present

## 2018-10-08 DIAGNOSIS — X58XXXA Exposure to other specified factors, initial encounter: Secondary | ICD-10-CM | POA: Diagnosis not present

## 2018-10-08 DIAGNOSIS — I252 Old myocardial infarction: Secondary | ICD-10-CM | POA: Diagnosis not present

## 2018-10-08 DIAGNOSIS — Z7982 Long term (current) use of aspirin: Secondary | ICD-10-CM | POA: Diagnosis not present

## 2018-10-08 DIAGNOSIS — Z955 Presence of coronary angioplasty implant and graft: Secondary | ICD-10-CM | POA: Diagnosis not present

## 2018-10-08 DIAGNOSIS — Z79899 Other long term (current) drug therapy: Secondary | ICD-10-CM

## 2018-10-08 DIAGNOSIS — M1990 Unspecified osteoarthritis, unspecified site: Secondary | ICD-10-CM | POA: Diagnosis not present

## 2018-10-08 DIAGNOSIS — M797 Fibromyalgia: Secondary | ICD-10-CM

## 2018-10-08 DIAGNOSIS — I10 Essential (primary) hypertension: Secondary | ICD-10-CM | POA: Diagnosis not present

## 2018-10-08 DIAGNOSIS — Z6831 Body mass index (BMI) 31.0-31.9, adult: Secondary | ICD-10-CM | POA: Diagnosis not present

## 2018-10-08 DIAGNOSIS — Z7902 Long term (current) use of antithrombotics/antiplatelets: Secondary | ICD-10-CM | POA: Diagnosis not present

## 2018-10-08 DIAGNOSIS — Z7984 Long term (current) use of oral hypoglycemic drugs: Secondary | ICD-10-CM

## 2018-10-08 DIAGNOSIS — K219 Gastro-esophageal reflux disease without esophagitis: Secondary | ICD-10-CM | POA: Diagnosis not present

## 2018-10-08 DIAGNOSIS — S8261XA Displaced fracture of lateral malleolus of right fibula, initial encounter for closed fracture: Secondary | ICD-10-CM | POA: Diagnosis present

## 2018-10-08 HISTORY — DX: Complete loss of teeth, unspecified cause, unspecified class: Z97.2

## 2018-10-08 HISTORY — DX: Complete loss of teeth, unspecified cause, unspecified class: K08.109

## 2018-10-08 HISTORY — DX: Presence of spectacles and contact lenses: Z97.3

## 2018-10-08 LAB — HEMOGLOBIN A1C
Hgb A1c MFr Bld: 5.5 % (ref 4.8–5.6)
Mean Plasma Glucose: 111.15 mg/dL

## 2018-10-08 LAB — GLUCOSE, CAPILLARY: Glucose-Capillary: 150 mg/dL — ABNORMAL HIGH (ref 70–99)

## 2018-10-08 NOTE — Progress Notes (Signed)
CBCdiff and CMET results dated 09-20-2018 in epic.  EKG dated 09-20-2018 in epic.  Telephone cardiac clearance , dr Irish Lack, dated 10-05-2018 in epic and chart.  [place] cardiology note dated 12-14-2017 in epic.  CXR dated 03-16-2018 in epic.

## 2018-10-09 ENCOUNTER — Other Ambulatory Visit: Payer: Self-pay | Admitting: Family Medicine

## 2018-10-09 DIAGNOSIS — E2839 Other primary ovarian failure: Secondary | ICD-10-CM

## 2018-10-10 ENCOUNTER — Ambulatory Visit (HOSPITAL_COMMUNITY): Payer: Medicare Other | Admitting: Anesthesiology

## 2018-10-10 ENCOUNTER — Observation Stay (HOSPITAL_COMMUNITY)
Admission: RE | Admit: 2018-10-10 | Discharge: 2018-10-11 | Disposition: A | Discharge status: Home / Self Care | Payer: Medicare Other | Source: Ambulatory Visit | Attending: Orthopedic Surgery | Admitting: Orthopedic Surgery

## 2018-10-10 ENCOUNTER — Encounter (HOSPITAL_COMMUNITY): Payer: Self-pay | Admitting: *Deleted

## 2018-10-10 ENCOUNTER — Encounter (HOSPITAL_COMMUNITY)
Admission: RE | Disposition: A | Discharge status: Home / Self Care | Payer: Self-pay | Source: Ambulatory Visit | Attending: Orthopedic Surgery

## 2018-10-10 ENCOUNTER — Observation Stay (HOSPITAL_COMMUNITY): Payer: Medicare Other

## 2018-10-10 ENCOUNTER — Ambulatory Visit (HOSPITAL_COMMUNITY): Payer: Medicare Other

## 2018-10-10 DIAGNOSIS — I2510 Atherosclerotic heart disease of native coronary artery without angina pectoris: Secondary | ICD-10-CM | POA: Diagnosis not present

## 2018-10-10 DIAGNOSIS — Z7902 Long term (current) use of antithrombotics/antiplatelets: Secondary | ICD-10-CM | POA: Insufficient documentation

## 2018-10-10 DIAGNOSIS — I252 Old myocardial infarction: Secondary | ICD-10-CM | POA: Diagnosis not present

## 2018-10-10 DIAGNOSIS — Z7982 Long term (current) use of aspirin: Secondary | ICD-10-CM | POA: Insufficient documentation

## 2018-10-10 DIAGNOSIS — E785 Hyperlipidemia, unspecified: Secondary | ICD-10-CM | POA: Insufficient documentation

## 2018-10-10 DIAGNOSIS — S8261XA Displaced fracture of lateral malleolus of right fibula, initial encounter for closed fracture: Principal | ICD-10-CM | POA: Insufficient documentation

## 2018-10-10 DIAGNOSIS — I10 Essential (primary) hypertension: Secondary | ICD-10-CM | POA: Diagnosis not present

## 2018-10-10 DIAGNOSIS — Z87891 Personal history of nicotine dependence: Secondary | ICD-10-CM | POA: Insufficient documentation

## 2018-10-10 DIAGNOSIS — Z7984 Long term (current) use of oral hypoglycemic drugs: Secondary | ICD-10-CM | POA: Insufficient documentation

## 2018-10-10 DIAGNOSIS — Z955 Presence of coronary angioplasty implant and graft: Secondary | ICD-10-CM | POA: Insufficient documentation

## 2018-10-10 DIAGNOSIS — E669 Obesity, unspecified: Secondary | ICD-10-CM | POA: Insufficient documentation

## 2018-10-10 DIAGNOSIS — K219 Gastro-esophageal reflux disease without esophagitis: Secondary | ICD-10-CM | POA: Insufficient documentation

## 2018-10-10 DIAGNOSIS — S82831A Other fracture of upper and lower end of right fibula, initial encounter for closed fracture: Secondary | ICD-10-CM

## 2018-10-10 DIAGNOSIS — Z419 Encounter for procedure for purposes other than remedying health state, unspecified: Secondary | ICD-10-CM

## 2018-10-10 DIAGNOSIS — X58XXXA Exposure to other specified factors, initial encounter: Secondary | ICD-10-CM | POA: Insufficient documentation

## 2018-10-10 DIAGNOSIS — E119 Type 2 diabetes mellitus without complications: Secondary | ICD-10-CM | POA: Insufficient documentation

## 2018-10-10 DIAGNOSIS — M1990 Unspecified osteoarthritis, unspecified site: Secondary | ICD-10-CM | POA: Insufficient documentation

## 2018-10-10 DIAGNOSIS — Z6831 Body mass index (BMI) 31.0-31.9, adult: Secondary | ICD-10-CM | POA: Insufficient documentation

## 2018-10-10 DIAGNOSIS — M797 Fibromyalgia: Secondary | ICD-10-CM | POA: Insufficient documentation

## 2018-10-10 HISTORY — PX: ORIF ANKLE FRACTURE: SHX5408

## 2018-10-10 HISTORY — DX: Other fracture of upper and lower end of right fibula, initial encounter for closed fracture: S82.831A

## 2018-10-10 LAB — CBC
HCT: 35 % — ABNORMAL LOW (ref 36.0–46.0)
HEMOGLOBIN: 11.6 g/dL — AB (ref 12.0–15.0)
MCH: 31.2 pg (ref 26.0–34.0)
MCHC: 33.1 g/dL (ref 30.0–36.0)
MCV: 94.1 fL (ref 80.0–100.0)
Platelets: 211 10*3/uL (ref 150–400)
RBC: 3.72 MIL/uL — ABNORMAL LOW (ref 3.87–5.11)
RDW: 13 % (ref 11.5–15.5)
WBC: 10.6 10*3/uL — ABNORMAL HIGH (ref 4.0–10.5)
nRBC: 0 % (ref 0.0–0.2)

## 2018-10-10 LAB — GLUCOSE, CAPILLARY
GLUCOSE-CAPILLARY: 136 mg/dL — AB (ref 70–99)
Glucose-Capillary: 90 mg/dL (ref 70–99)
Glucose-Capillary: 230 mg/dL — ABNORMAL HIGH (ref 70–99)

## 2018-10-10 LAB — CREATININE, SERUM
Creatinine, Ser: 0.79 mg/dL (ref 0.44–1.00)
GFR calc Af Amer: >60 mL/min (ref >60)

## 2018-10-10 SURGERY — OPEN REDUCTION INTERNAL FIXATION (ORIF) ANKLE FRACTURE
Anesthesia: General | Site: Ankle | Laterality: Right

## 2018-10-10 MED ORDER — MIDAZOLAM HCL 5 MG/5ML IJ SOLN
INTRAMUSCULAR | Status: DC | PRN
  Administered 2018-10-10: 2 mg via INTRAVENOUS

## 2018-10-10 MED ORDER — PROMETHAZINE HCL 25 MG/ML IJ SOLN: 6.2500 mg | INTRAMUSCULAR | Status: DC | PRN

## 2018-10-10 MED ORDER — PANTOPRAZOLE SODIUM 40 MG PO TBEC
40.0000 mg | DELAYED_RELEASE_TABLET | Freq: Every day | ORAL | Status: DC
  Administered 2018-10-10 – 2018-10-11 (×2): 40 mg via ORAL
  Filled 2018-10-10 (×2): qty 1

## 2018-10-10 MED ORDER — INSULIN ASPART 100 UNIT/ML ~~LOC~~ SOLN
0.0000 [IU] | Freq: Every day | SUBCUTANEOUS | Status: DC
  Administered 2018-10-10: 2 [IU] via SUBCUTANEOUS

## 2018-10-10 MED ORDER — PHENYLEPHRINE 40 MCG/ML (10ML) SYRINGE FOR IV PUSH (FOR BLOOD PRESSURE SUPPORT)
PREFILLED_SYRINGE | INTRAVENOUS | Status: DC | PRN
  Administered 2018-10-10 (×4): 80 ug via INTRAVENOUS

## 2018-10-10 MED ORDER — LIDOCAINE 2% (20 MG/ML) 5 ML SYRINGE
INTRAMUSCULAR | Status: DC | PRN
  Administered 2018-10-10: 60 mg via INTRAVENOUS

## 2018-10-10 MED ORDER — FENTANYL CITRATE (PF) 100 MCG/2ML IJ SOLN
INTRAMUSCULAR | Status: DC | PRN
  Administered 2018-10-10 (×7): 25 ug via INTRAVENOUS

## 2018-10-10 MED ORDER — KETOROLAC TROMETHAMINE 30 MG/ML IJ SOLN: 30.0000 mg | Freq: Once | INTRAMUSCULAR | Status: DC | PRN

## 2018-10-10 MED ORDER — SITAGLIP PHOS-METFORMIN HCL ER 50-1000 MG PO TB24: 1.0000 | ORAL_TABLET | Freq: Two times a day (BID) | ORAL | Status: DC

## 2018-10-10 MED ORDER — LIDOCAINE-EPINEPHRINE 2 %-1:100000 IJ SOLN
INTRAMUSCULAR | Status: DC | PRN
  Administered 2018-10-10: 20 mL via PERINEURAL

## 2018-10-10 MED ORDER — LIDOCAINE 2% (20 MG/ML) 5 ML SYRINGE
INTRAMUSCULAR | Status: AC
  Filled 2018-10-10: qty 5

## 2018-10-10 MED ORDER — EPHEDRINE SULFATE-NACL 50-0.9 MG/10ML-% IV SOSY
PREFILLED_SYRINGE | INTRAVENOUS | Status: DC | PRN
  Administered 2018-10-10: 10 mg via INTRAVENOUS

## 2018-10-10 MED ORDER — IRBESARTAN 150 MG PO TABS
300.0000 mg | ORAL_TABLET | Freq: Every day | ORAL | Status: DC
  Administered 2018-10-11: 300 mg via ORAL
  Filled 2018-10-10: qty 2

## 2018-10-10 MED ORDER — AMLODIPINE BESYLATE 5 MG PO TABS: 5.0000 mg | ORAL_TABLET | Freq: Every evening | ORAL | Status: DC

## 2018-10-10 MED ORDER — METOPROLOL SUCCINATE ER 25 MG PO TB24
25.0000 mg | ORAL_TABLET | Freq: Every morning | ORAL | Status: DC
  Administered 2018-10-11: 25 mg via ORAL
  Filled 2018-10-10: qty 1

## 2018-10-10 MED ORDER — BUPIVACAINE HCL (PF) 0.5 % IJ SOLN
INTRAMUSCULAR | Status: DC | PRN
  Administered 2018-10-10: 30 mL via PERINEURAL

## 2018-10-10 MED ORDER — PROPOFOL 10 MG/ML IV BOLUS
INTRAVENOUS | Status: AC
  Filled 2018-10-10: qty 20

## 2018-10-10 MED ORDER — NITROGLYCERIN 0.4 MG SL SUBL: 0.4000 mg | SUBLINGUAL_TABLET | SUBLINGUAL | Status: DC | PRN

## 2018-10-10 MED ORDER — SODIUM CHLORIDE 0.9 % IV SOLN
INTRAVENOUS | Status: DC
  Administered 2018-10-10: 20:00:00 via INTRAVENOUS

## 2018-10-10 MED ORDER — ENOXAPARIN SODIUM 40 MG/0.4ML ~~LOC~~ SOLN
40.0000 mg | SUBCUTANEOUS | Status: DC
  Administered 2018-10-11: 40 mg via SUBCUTANEOUS
  Filled 2018-10-10: qty 0.4

## 2018-10-10 MED ORDER — LINAGLIPTIN 5 MG PO TABS
5.0000 mg | ORAL_TABLET | Freq: Every day | ORAL | Status: DC
  Administered 2018-10-11: 5 mg via ORAL
  Filled 2018-10-10: qty 1

## 2018-10-10 MED ORDER — ONDANSETRON HCL 4 MG/2ML IJ SOLN: 4.0000 mg | Freq: Four times a day (QID) | INTRAMUSCULAR | Status: DC | PRN

## 2018-10-10 MED ORDER — FENTANYL CITRATE (PF) 100 MCG/2ML IJ SOLN
INTRAMUSCULAR | Status: AC
  Filled 2018-10-10: qty 2

## 2018-10-10 MED ORDER — LACTATED RINGERS IV SOLN
INTRAVENOUS | Status: DC
  Administered 2018-10-10: 13:00:00 via INTRAVENOUS

## 2018-10-10 MED ORDER — MORPHINE SULFATE (PF) 4 MG/ML IV SOLN: 0.5000 mg | INTRAVENOUS | Status: DC | PRN

## 2018-10-10 MED ORDER — INSULIN ASPART 100 UNIT/ML ~~LOC~~ SOLN
0.0000 [IU] | Freq: Three times a day (TID) | SUBCUTANEOUS | Status: DC
  Administered 2018-10-11: 2 [IU] via SUBCUTANEOUS

## 2018-10-10 MED ORDER — PROPOFOL 10 MG/ML IV BOLUS
INTRAVENOUS | Status: DC | PRN
  Administered 2018-10-10: 200 mg via INTRAVENOUS

## 2018-10-10 MED ORDER — DEXAMETHASONE SODIUM PHOSPHATE 10 MG/ML IJ SOLN
INTRAMUSCULAR | Status: DC | PRN
  Administered 2018-10-10: 10 mg via INTRAVENOUS

## 2018-10-10 MED ORDER — HYDROCODONE-ACETAMINOPHEN 5-325 MG PO TABS
1.0000 | ORAL_TABLET | ORAL | Status: DC | PRN
  Administered 2018-10-11: 2 via ORAL
  Filled 2018-10-10: qty 2

## 2018-10-10 MED ORDER — CLONIDINE HCL (ANALGESIA) 100 MCG/ML EP SOLN
EPIDURAL | Status: DC | PRN
  Administered 2018-10-10: 50 ug

## 2018-10-10 MED ORDER — FENTANYL CITRATE (PF) 100 MCG/2ML IJ SOLN
INTRAMUSCULAR | Status: AC
  Administered 2018-10-10: 25 ug via INTRAVENOUS
  Filled 2018-10-10: qty 2

## 2018-10-10 MED ORDER — MIDAZOLAM HCL 2 MG/2ML IJ SOLN
INTRAMUSCULAR | Status: AC
  Filled 2018-10-10: qty 2

## 2018-10-10 MED ORDER — CHLORHEXIDINE GLUCONATE 4 % EX LIQD: 60.0000 mL | Freq: Once | CUTANEOUS | Status: DC

## 2018-10-10 MED ORDER — EPHEDRINE 5 MG/ML INJ
INTRAVENOUS | Status: AC
  Filled 2018-10-10: qty 10

## 2018-10-10 MED ORDER — EZETIMIBE 10 MG PO TABS
10.0000 mg | ORAL_TABLET | Freq: Every evening | ORAL | Status: DC
  Administered 2018-10-10: 10 mg via ORAL
  Filled 2018-10-10: qty 1

## 2018-10-10 MED ORDER — MIDAZOLAM HCL 2 MG/2ML IJ SOLN
1.0000 mg | Freq: Once | INTRAMUSCULAR | Status: AC
  Administered 2018-10-10: 2 mg via INTRAVENOUS
  Filled 2018-10-10: qty 2

## 2018-10-10 MED ORDER — METOCLOPRAMIDE HCL 5 MG PO TABS: 5.0000 mg | ORAL_TABLET | Freq: Three times a day (TID) | ORAL | Status: DC | PRN

## 2018-10-10 MED ORDER — CEFAZOLIN SODIUM-DEXTROSE 2-4 GM/100ML-% IV SOLN
2.0000 g | Freq: Four times a day (QID) | INTRAVENOUS | Status: AC
  Administered 2018-10-10 – 2018-10-11 (×3): 2 g via INTRAVENOUS
  Filled 2018-10-10 (×3): qty 100

## 2018-10-10 MED ORDER — DEXAMETHASONE SODIUM PHOSPHATE 10 MG/ML IJ SOLN
INTRAMUSCULAR | Status: AC
  Filled 2018-10-10: qty 1

## 2018-10-10 MED ORDER — ISOPROPYL ALCOHOL 70 % SOLN
Status: DC | PRN
  Administered 2018-10-10: 1 via TOPICAL

## 2018-10-10 MED ORDER — HYDROCODONE-ACETAMINOPHEN 7.5-325 MG PO TABS
1.0000 | ORAL_TABLET | ORAL | Status: DC | PRN
  Administered 2018-10-11: 1 via ORAL
  Filled 2018-10-10: qty 1

## 2018-10-10 MED ORDER — ACETAMINOPHEN 325 MG PO TABS: 325.0000 mg | ORAL_TABLET | Freq: Four times a day (QID) | ORAL | Status: DC | PRN

## 2018-10-10 MED ORDER — FENTANYL CITRATE (PF) 100 MCG/2ML IJ SOLN
50.0000 ug | Freq: Once | INTRAMUSCULAR | Status: AC
  Administered 2018-10-10: 100 ug via INTRAVENOUS
  Filled 2018-10-10: qty 2

## 2018-10-10 MED ORDER — ONDANSETRON HCL 4 MG PO TABS: 4.0000 mg | ORAL_TABLET | Freq: Four times a day (QID) | ORAL | Status: DC | PRN

## 2018-10-10 MED ORDER — CEFAZOLIN SODIUM-DEXTROSE 2-4 GM/100ML-% IV SOLN
2.0000 g | INTRAVENOUS | Status: AC
  Administered 2018-10-10: 2 g via INTRAVENOUS
  Filled 2018-10-10: qty 100

## 2018-10-10 MED ORDER — METOCLOPRAMIDE HCL 5 MG/ML IJ SOLN: 5.0000 mg | Freq: Three times a day (TID) | INTRAMUSCULAR | Status: DC | PRN

## 2018-10-10 MED ORDER — DOCUSATE SODIUM 100 MG PO CAPS
100.0000 mg | ORAL_CAPSULE | Freq: Two times a day (BID) | ORAL | Status: DC
  Administered 2018-10-10: 100 mg via ORAL
  Filled 2018-10-10 (×2): qty 1

## 2018-10-10 MED ORDER — ONDANSETRON HCL 4 MG/2ML IJ SOLN
INTRAMUSCULAR | Status: AC
  Filled 2018-10-10: qty 2

## 2018-10-10 MED ORDER — 0.9 % SODIUM CHLORIDE (POUR BTL) OPTIME
TOPICAL | Status: DC | PRN
  Administered 2018-10-10: 1000 mL

## 2018-10-10 MED ORDER — METFORMIN HCL ER 500 MG PO TB24
1000.0000 mg | ORAL_TABLET | Freq: Two times a day (BID) | ORAL | Status: DC
  Administered 2018-10-11: 1000 mg via ORAL
  Filled 2018-10-10: qty 2

## 2018-10-10 MED ORDER — PHENYLEPHRINE 40 MCG/ML (10ML) SYRINGE FOR IV PUSH (FOR BLOOD PRESSURE SUPPORT)
PREFILLED_SYRINGE | INTRAVENOUS | Status: AC
  Filled 2018-10-10: qty 10

## 2018-10-10 MED ORDER — FENTANYL CITRATE (PF) 100 MCG/2ML IJ SOLN
25.0000 ug | INTRAMUSCULAR | Status: DC | PRN
  Administered 2018-10-10: 25 ug via INTRAVENOUS

## 2018-10-10 MED ORDER — ATORVASTATIN CALCIUM 40 MG PO TABS
80.0000 mg | ORAL_TABLET | Freq: Every day | ORAL | Status: DC
  Administered 2018-10-10: 80 mg via ORAL
  Filled 2018-10-10: qty 2

## 2018-10-10 MED ORDER — SENNA 8.6 MG PO TABS
1.0000 | ORAL_TABLET | Freq: Two times a day (BID) | ORAL | Status: DC
  Administered 2018-10-10: 8.6 mg via ORAL
  Filled 2018-10-10 (×2): qty 1

## 2018-10-10 MED ORDER — ONDANSETRON HCL 4 MG/2ML IJ SOLN
INTRAMUSCULAR | Status: DC | PRN
  Administered 2018-10-10: 4 mg via INTRAVENOUS

## 2018-10-10 SURGICAL SUPPLY — 64 items
BAG ZIPLOCK 12X15 (MISCELLANEOUS) ×3 IMPLANT
BANDAGE ACE 4X5 VEL STRL LF (GAUZE/BANDAGES/DRESSINGS) ×3 IMPLANT
BANDAGE ACE 6X5 VEL STRL LF (GAUZE/BANDAGES/DRESSINGS) ×3 IMPLANT
BIT DRILL 2.5X2.75 QC CALB (BIT) ×3 IMPLANT
BIT DRILL 3.5X5.5 QC CALB (BIT) ×3 IMPLANT
BNDG COHESIVE 4X5 TAN STRL (GAUZE/BANDAGES/DRESSINGS) ×3 IMPLANT
BNDG PLASTER X FAST 4X5 WHT LF (CAST SUPPLIES) ×12 IMPLANT
BNDG PLASTER X FAST 6X5 WHT LF (CAST SUPPLIES) ×6 IMPLANT
CANISTER PREVENA PLUS 150 (CANNISTER) ×3 IMPLANT
CHLORAPREP W/TINT 26ML (MISCELLANEOUS) ×3 IMPLANT
CLOSURE WOUND 1/2 X4 (GAUZE/BANDAGES/DRESSINGS)
COVER SURGICAL LIGHT HANDLE (MISCELLANEOUS) ×3 IMPLANT
COVER WAND RF STERILE (DRAPES) ×3 IMPLANT
CUFF TOURN SGL QUICK 24 (TOURNIQUET CUFF) ×2
CUFF TOURN SGL QUICK 34 (TOURNIQUET CUFF)
CUFF TRNQT CYL 24X4X16.5-23 (TOURNIQUET CUFF) ×1 IMPLANT
CUFF TRNQT CYL 34X4X40X1 (TOURNIQUET CUFF) IMPLANT
DRAPE C-ARM 42X120 X-RAY (DRAPES) ×3 IMPLANT
DRAPE C-ARMOR (DRAPES) ×3 IMPLANT
DRAPE SHEET LG 3/4 BI-LAMINATE (DRAPES) ×9 IMPLANT
DRAPE U-SHAPE 47X51 STRL (DRAPES) ×3 IMPLANT
DRESSING PREVENA PLUS CUSTOM (GAUZE/BANDAGES/DRESSINGS) ×1 IMPLANT
DRSG ADAPTIC 3X8 NADH LF (GAUZE/BANDAGES/DRESSINGS) ×3 IMPLANT
DRSG PAD ABDOMINAL 8X10 ST (GAUZE/BANDAGES/DRESSINGS) IMPLANT
DRSG PREVENA PLUS CUSTOM (GAUZE/BANDAGES/DRESSINGS) ×3
ELECT REM PT RETURN 15FT ADLT (MISCELLANEOUS) ×3 IMPLANT
FIXATION ZIPTIGHT ANKLE SNDSMS (Ankle) ×1 IMPLANT
GAUZE SPONGE 4X4 12PLY STRL (GAUZE/BANDAGES/DRESSINGS) ×3 IMPLANT
GLOVE BIO SURGEON STRL SZ8.5 (GLOVE) ×3 IMPLANT
GLOVE BIOGEL PI IND STRL 8.5 (GLOVE) ×2 IMPLANT
GLOVE BIOGEL PI INDICATOR 8.5 (GLOVE) ×4
GOWN SPEC L3 XXLG W/TWL (GOWN DISPOSABLE) ×6 IMPLANT
GOWN STRL REUS W/ TWL XL LVL3 (GOWN DISPOSABLE) ×1 IMPLANT
GOWN STRL REUS W/TWL XL LVL3 (GOWN DISPOSABLE) ×2
KIT PREVENA INCISION MGT 13 (CANNISTER) ×3 IMPLANT
MANIFOLD NEPTUNE II (INSTRUMENTS) ×3 IMPLANT
NS IRRIG 1000ML POUR BTL (IV SOLUTION) ×3 IMPLANT
PACK TOTAL KNEE CUSTOM (KITS) ×3 IMPLANT
PADDING CAST ABS 6INX4YD NS (CAST SUPPLIES) ×4
PADDING CAST ABS COTTON 6X4 NS (CAST SUPPLIES) ×2 IMPLANT
PADDING CAST SYN 6 (CAST SUPPLIES) ×2
PADDING CAST SYNTHETIC 4 (CAST SUPPLIES) ×8
PADDING CAST SYNTHETIC 4X4 STR (CAST SUPPLIES) ×4 IMPLANT
PADDING CAST SYNTHETIC 6X4 NS (CAST SUPPLIES) ×1 IMPLANT
PLATE LOCK 3H 95 RT DIST FIB (Plate) ×3 IMPLANT
PROTECTOR NERVE ULNAR (MISCELLANEOUS) ×3 IMPLANT
SCREW CORTICAL 3.5MM  28MM (Screw) ×2 IMPLANT
SCREW CORTICAL 3.5MM 28MM (Screw) ×1 IMPLANT
SCREW LOCK CORT STAR 3.5X12 (Screw) ×3 IMPLANT
SCREW LOCK CORT STAR 3.5X14 (Screw) ×6 IMPLANT
SCREW LOCK CORT STAR 3.5X16 (Screw) ×3 IMPLANT
SCREW LOW PROFILE 18MMX3.5MM (Screw) ×6 IMPLANT
SCREW NON LOCKING LP 3.5 14MM (Screw) ×9 IMPLANT
STRIP CLOSURE SKIN 1/2X4 (GAUZE/BANDAGES/DRESSINGS) IMPLANT
SUT ETHILON 3 0 PS 1 (SUTURE) ×6 IMPLANT
SUT MNCRL AB 4-0 PS2 18 (SUTURE) ×3 IMPLANT
SUT VIC AB 1 CT1 27 (SUTURE)
SUT VIC AB 1 CT1 27XBRD ANTBC (SUTURE) IMPLANT
SUT VIC AB 2-0 CT1 27 (SUTURE) ×4
SUT VIC AB 2-0 CT1 TAPERPNT 27 (SUTURE) ×2 IMPLANT
SUT VIC AB 3-0 PS2 18 (SUTURE) ×2
SUT VIC AB 3-0 PS2 18XBRD (SUTURE) ×1 IMPLANT
TOWEL OR 17X26 10 PK STRL BLUE (TOWEL DISPOSABLE) ×3 IMPLANT
ZIPTIGHT ANKLE SYNODESMOSS FIX (Ankle) ×3 IMPLANT

## 2018-10-10 NOTE — Anesthesia Postprocedure Evaluation (Signed)
Anesthesia Post Note  Patient: Suzanne Watkins  Procedure(s) Performed: OPEN REDUCTION INTERNAL FIXATION (ORIF) ANKLE FRACTURE (Right Ankle)     Patient location during evaluation: PACU Anesthesia Type: General Level of consciousness: awake and alert Pain management: pain level controlled Vital Signs Assessment: post-procedure vital signs reviewed and stable Respiratory status: spontaneous breathing, nonlabored ventilation, respiratory function stable and patient connected to nasal cannula oxygen Cardiovascular status: blood pressure returned to baseline and stable Postop Assessment: no apparent nausea or vomiting Anesthetic complications: no    Last Vitals:  Vitals:   10/10/18 1815 10/10/18 1830  BP: 122/63 117/60  Pulse: 78 75  Resp: 16 12  Temp:    SpO2: 98% 99%    Last Pain:  Vitals:   10/10/18 1815  TempSrc:   PainSc: 2                  [name] S

## 2018-10-10 NOTE — Transfer of Care (Signed)
Immediate Anesthesia Transfer of Care Note  Patient: Suzanne Watkins  Procedure(s) Performed: Procedure(s): OPEN REDUCTION INTERNAL FIXATION (ORIF) ANKLE FRACTURE (Right)  Patient Location: PACU  Anesthesia Type:General  Level of Consciousness:  sedated, patient cooperative and responds to stimulation  Airway & Oxygen Therapy:Patient Spontanous Breathing and Patient connected to face mask oxgen  Post-op Assessment:  Report given to PACU RN and Post -op Vital signs reviewed and stable  Post vital signs:  Reviewed and stable  Last Vitals:  Vitals:   10/10/18 1427 10/10/18 1428  BP:    Pulse: 70 66  Resp: 17 16  Temp:    SpO2: 16% 01%    Complications: No apparent anesthesia complications

## 2018-10-10 NOTE — Anesthesia Procedure Notes (Signed)
Procedure Name: LMA Insertion Date/Time: 10/10/2018 3:19 PM Performed by: Talbot Grumbling, CRNA Pre-anesthesia Checklist: Emergency Drugs available, Suction available, Patient being monitored and Patient identified Patient Re-evaluated:Patient Re-evaluated prior to induction Oxygen Delivery Method: Circle system utilized Preoxygenation: Pre-oxygenation with 100% oxygen Induction Type: IV induction Ventilation: Mask ventilation without difficulty LMA: LMA inserted LMA Size: 4.0 Number of attempts: 1 Placement Confirmation: positive ETCO2 and breath sounds checked- equal and bilateral Tube secured with: Tape Dental Injury: Teeth and Oropharynx as per pre-operative assessment

## 2018-10-10 NOTE — Anesthesia Procedure Notes (Signed)
Anesthesia Regional Block: Femoral nerve block (saphenous)   Pre-Anesthetic Checklist: ,, timeout performed, Correct Patient, Correct Site, Correct Laterality, Correct Procedure, Correct Position, site marked, Risks and benefits discussed,  Surgical consent,  Pre-op evaluation,  At surgeon's request and post-op pain management  Laterality: Right  Prep: chloraprep       Needles:  Injection technique: Single-shot  Needle Type: Echogenic Needle     Needle Length: 5cm      Additional Needles:   Procedures:,,,, ultrasound used (permanent image in chart),,,,  Narrative:  Start time: 10/10/2018 2:17 PM End time: 10/10/2018 2:21 PM Injection made incrementally with aspirations every 5 mL.  Performed by: Personally  Anesthesiologist: Myrtie Soman, MD  Additional Notes: Patient tolerated the procedure well without complications

## 2018-10-10 NOTE — Progress Notes (Signed)
Assisted Dr. Rose with right, ultrasound guided, popliteal block. Side rails up, monitors on throughout procedure. See vital signs in flow sheet. Tolerated Procedure well. °

## 2018-10-10 NOTE — H&P (Signed)
PREOPERATIVE H&P  Chief Complaint: Right ankle fracture  HPI: Suzanne Watkins is a 67 y.o. female who presents for preoperative history and physical with a diagnosis of Right ankle fracture with shortening. Has been NWB in splint.  She has elected for surgical management. Last Brillinta was 5 days ago.  Past Medical History:  Diagnosis Date  . Arthritis    "probably in my hands" (09/04/2013)  . 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 Cervical cancer ([place]) 1986  . Coronary atherosclerosis of native coronary artery 09/04/2013   RCA and obtuse marginal stent/DES-2010  . Fibromyalgia    "dx'd in 1994"  . Full dentures   . GERD (gastroesophageal reflux disease)   . Hyperlipidemia   . Hypertension   . Obesity   . Shortness of breath    "just related to angina I was having recently" (09/04/2013)  . Type II diabetes mellitus ([place])    followed by pcp  . Wears glasses    Past Surgical History:  Procedure Laterality Date  . CARPAL TUNNEL RELEASE Right 07/1992  . CERVICAL CONE BIOPSY  03/1985  . CORONARY ANGIOPLASTY WITH STENT PLACEMENT  07/2009, 08/2009; 09/04/2013   "1+1+2; total of 4" (09/04/2013)  . LEFT HEART CATHETERIZATION WITH CORONARY ANGIOGRAM N/A 09/04/2013   Procedure: LEFT HEART CATHETERIZATION WITH CORONARY ANGIOGRAM;  Surgeon: Candee Furbish, MD;  Location: [facility] CATH LAB;  Service: Cardiovascular;  Laterality: N/A;  . MOHS SURGERY Left 05/2008   "nostril"  . SHOULDER OPEN ROTATOR CUFF REPAIR Right 10/2004   "got 5 pins in" (09/04/2013)  . TUBAL LIGATION Bilateral 1979   Social History   Socioeconomic History  . Marital status: Married    Spouse name: Not on file  . Number of children: Not on file  . Years of education: Not on file  . Highest education level: Not on file  Occupational History  . Not on file  Social Needs  . Financial resource strain: Not on file  . Food insecurity:    Worry: Not on file    Inability: Not on file  . Transportation needs:    Medical: Not on  file    Non-medical: Not on file  Tobacco Use  . Smoking status: Former Smoker    Packs/day: 1.00    Years: 29.00    Pack years: 29.00    Types: Cigarettes    Last attempt to quit: 06/14/2006    Years since quitting: 12.3  . Smokeless tobacco: Never Used  Substance and Sexual Activity  . Alcohol use: No  . Drug use: No  . Sexual activity: Yes  Lifestyle  . Physical activity:    Days per week: Not on file    Minutes per session: Not on file  . Stress: Not on file  Relationships  . Social connections:    Talks on phone: Not on file    Gets together: Not on file    Attends religious service: Not on file    Active member of club or organization: Not on file    Attends meetings of clubs or organizations: Not on file    Relationship status: Not on file  Other Topics Concern  . Not on file  Social History Narrative   Marital status: married      Children: grown children; 9 grandchildren; 1 gg      Lives: with husband, 2 birds/[employer]      Employment:  [employer] x 18 years; billing      Tobacco:  quit smoking 05/2006      Alcohol:  None     Exercise: walking 30 minutes per day   Family History  Problem Relation Age of Onset  . Heart disease Mother 62       AMI; cause of death  . Hyperlipidemia Mother   . Cancer Father        Throat  . Hyperlipidemia Brother   . Hypertension Brother   . Drug abuse Brother   . Diabetes Daughter   . Drug abuse Daughter   . Heart disease Daughter   . Lupus Daughter   . Heart disease Paternal Grandmother   . Stroke Paternal Grandfather    Allergies  Allergen Reactions  . Plavix [Clopidogrel Bisulfate] Other (See Comments)    Stomach upset  . Adhesive [Tape] Rash   Prior to Admission medications   Medication Sig Start Date End Date Taking? Authorizing Provider  amLODipine (NORVASC) 5 MG tablet TAKE 1 TABLET BY MOUTH DAILY Patient taking differently: Take 5 mg by mouth every evening.  02/27/18  Yes Jettie Booze, MD   atorvastatin (LIPITOR) 80 MG tablet Take 1 tablet (80 mg total) by mouth daily. Patient taking differently: Take 80 mg by mouth at bedtime.  03/01/18  Yes Jettie Booze, MD  BRILINTA 90 MG TABS tablet TAKE 1 TABLET BY MOUTH TWICE DAILY Patient taking differently: Take 90 mg by mouth 2 (two) times daily.  12/26/17  Yes Jettie Booze, MD  Calcium Acetate, Phos Binder, (CALCIUM ACETATE PO) Take 2 tablets by mouth 2 (two) times daily.   Yes [provider]  diphenhydrAMINE (BENADRYL) 50 MG capsule Take 50 mg by mouth at bedtime.    Yes [provider]  ezetimibe (ZETIA) 10 MG tablet TAKE 1 TABLET BY MOUTH EVERY DAY Patient taking differently: Take 10 mg by mouth every evening.  12/26/17  Yes Jettie Booze, MD  glucose blood test strip Use as instructed. One touch ultra meter. 02/13/17  Yes Shawnee Knapp, MD  metoprolol succinate (TOPROL-XL) 25 MG 24 hr tablet Take 1 tablet (25 mg total) by mouth daily. Patient taking differently: Take 25 mg by mouth every morning.  03/01/18  Yes Jettie Booze, MD  Multiple Vitamin (MULTIVITAMIN WITH MINERALS) TABS tablet Take 1 tablet by mouth daily.   Yes [provider]  nitroGLYCERIN (NITROSTAT) 0.4 MG SL tablet Place 1 tablet (0.4 mg total) under the tongue every 5 (five) minutes as needed for chest pain. 12/09/16  Yes Jettie Booze, MD  omeprazole (PRILOSEC) 20 MG capsule Take 20 mg by mouth every morning.    Yes [provider]  ONE TOUCH ULTRA TEST test strip USE AS DIRECTED 09/24/16  Yes Shawnee Knapp, MD  OVER THE COUNTER MEDICATION Take 680 mg by mouth at bedtime. Elemental Strontium   Yes [provider]  SitaGLIPtin-MetFORMIN HCl 50-1000 MG TB24 Take 2 tablets by mouth every morning. Patient taking differently: Take 1 tablet by mouth 2 (two) times daily.  09/20/18  Yes Shawnee Knapp, MD  valsartan (DIOVAN) 320 MG tablet TAKE 1 TABLET(320 MG) BY MOUTH DAILY Patient taking differently: Take  320 mg by mouth daily. TAKE 1 TABLET(320 MG) BY MOUTH DAILY 03/16/18  Yes Shawnee Knapp, MD     Positive ROS: All other systems have been reviewed and were otherwise negative with the exception of those mentioned in the HPI and as above.  Physical Exam: General: Alert, no acute distress Cardiovascular: No pedal  edema Respiratory: No cyanosis, no use of accessory musculature GI: No organomegaly, abdomen is soft and non-tender Skin: No lesions in the area of chief complaint Neurologic: Sensation intact distally Psychiatric: Patient is competent for consent with normal mood and affect Lymphatic: No axillary or cervical lymphadenopathy  MUSCULOSKELETAL: Right ankle skin intact. (+) skin wrinkles present. subj sensory change SP. Nl sensation DP/PT/S/S. 2+ DP.  Assessment: Right ankle fracture  Plan: Plan for Procedure(s): OPEN REDUCTION INTERNAL FIXATION (ORIF) ANKLE FRACTURE  The risks benefits and alternatives were discussed with the patient including but not limited to the risks of nonoperative treatment, versus surgical intervention including infection, bleeding, nerve injury, malunion, nonunion, blood clots, cardiopulmonary complications, morbidity, mortality, among others, and they were willing to proceed.   Bertram Savin, MD Cell [phone]   10/10/2018 2:19 PM

## 2018-10-10 NOTE — Op Note (Signed)
OPERATIVE REPORT   10/10/2018  5:55 PM  PATIENT:  Suzanne Watkins   SURGEON:  Bertram Savin, MD  ASSISTANT:  Sherlean Foot, RNFA.   PREOPERATIVE DIAGNOSIS:  Displaced right lateral malleolus fracture.  POSTOPERATIVE DIAGNOSIS:  Same.  PROCEDURE:  Open reduction internal fixation right lateral malleolus.  ANESTHESIA:   GETA.  ANTIBIOTICS:  2 g Ancef.  IMPLANTS:  Biomet ALPS lateral malleolus anatomic locking plate. 3.5 mm proximal bicortical compression screws x3. 3.5 mm distal locking screws x4. ZipTight syndesmotic implant x1.  SPECIMENS:  None.  COMPLICATIONS:  None.  DISPOSITION:  Stable to PACU.  SURGICAL INDICATIONS:  Suzanne Watkins is a 67 y.o. female with a diagnosis of displaced right lateral malleolus fracture with significant shortening.  The risks, benefits, and alternatives were discussed with the patient preoperatively including but not limited to the risks of infection, bleeding, nerve / blood vessel injury, malunion, nonunion, cardiopulmonary complications, the need for repeat surgery, among others, and the patient was willing to proceed.  PROCEDURE IN DETAIL: The patient was identified in the holding area using 2 identifiers.  Nerve block was placed by anesthesia.  The patient was taken to the operating room, and placed supine on the operating room table.  General anesthesia was induced.  Nonsterile tourniquet was applied to the right thigh.  Patient was positioned on a bone foam positioner.  All bony prominences were well-padded.  The right lower extremity was prepped and draped in the normal sterile surgical fashion.  Timeout was called, verifying site and site of surgery.  She did receive IV antibiotics within 60 minutes of beginning the procedure.  I began by making a longitudinal incision over the lateral malleolus.  Distally, the incision was carried sharply down to bone.  Proximally, careful blunt dissection was used to protect the superficial  peroneal nerve, which was not encountered during the case.  I identified the fracture.  The fracture site was cleared with a curette.  The fracture was reduced and held with a clamp.  I placed a lag screw using standard AO technique.  I selected a 3 hole lateral malleolus locking plate.  I secured the plate to the bone with bicortical compression screws proximally and distally.  Fracture reduction and placement of the hardware was confirmed on AP, mortise, and lateral fluoroscopy views.  I placed a total of 3 bicortical compression screws proximally.  Distally, I placed 3 locking screws.  The original compression screw was exchanged for a locking screw.  AP, mortise, and lateral fluoroscopy views were used to confirm fracture reduction and hardware placement.  There were no bicortical screws distally in the ankle.  Live external rotation stress testing was performed, and there was no lateral shifting of the talus.  Because she is a diabetic, I elected to augment the fixation with a zip tight syndesmotic implant, which was placed according to manufacturer's instructions.    The tourniquet was let down.  Hemostasis was achieved with Bovie electrocautery.  Fascia was closed over the hardware with 2-0 Vicryl suture.  Deep dermal layer was closed with 3-0 Vicryl suture.  Skin was closed with 3-0 nylon suture using an Allgauer-Donati technique. Prevena incisional wound VAC was applied.  I then applied a well-padded, well molded short leg posterior plus U plaster splint.  Sponge, needle, and instrument counts were correct at the end of the case x2.  There were no known complications.  She was then extubated and taken to the PACU in stable condition.  POSTOPERATIVE PLAN:  Postoperatively, the patient will be admitted for overnight observation.  Nonweightbearing right lower extremity.  Strict elevation toes above nose.  Keep splint clean and dry.  Do not remove splint.  Continue incisional wound VAC at 125 mmHg.  She will  work with physical therapy.  Lovenox in house for DVT prophylaxis.  Plan for discharge home tomorrow.

## 2018-10-10 NOTE — Anesthesia Preprocedure Evaluation (Signed)
Anesthesia Evaluation  Patient identified by MRN, date of birth, ID band Patient awake    Reviewed: Allergy & Precautions, NPO status , Patient's Chart, lab work & pertinent test results  Airway Mallampati: II  TM Distance: >3 FB Neck ROM: Full    Dental  (+) Upper Dentures, Lower Dentures   Pulmonary neg pulmonary ROS, former smoker,    Pulmonary exam normal breath sounds clear to auscultation       Cardiovascular hypertension, Pt. on medications and Pt. on home beta blockers + CAD, + Past MI and + Cardiac Stents  Normal cardiovascular exam Rhythm:Regular Rate:Normal     Neuro/Psych negative neurological ROS  negative psych ROS   GI/Hepatic negative GI ROS, Neg liver ROS,   Endo/Other  diabetes  Renal/GU negative Renal ROS  negative genitourinary   Musculoskeletal negative musculoskeletal ROS (+)   Abdominal   Peds negative pediatric ROS (+)  Hematology negative hematology ROS (+)   Anesthesia Other Findings   Reproductive/Obstetrics negative OB ROS                             Anesthesia Physical Anesthesia Plan  ASA: III  Anesthesia Plan: General   Post-op Pain Management:  Regional for Post-op pain   Induction: Intravenous  PONV Risk Score and Plan: 2 and Ondansetron and Dexamethasone  Airway Management Planned: LMA  Additional Equipment:   Intra-op Plan:   Post-operative Plan: Extubation in OR  Informed Consent: I have reviewed the patients History and Physical, chart, labs and discussed the procedure including the risks, benefits and alternatives for the proposed anesthesia with the patient or authorized representative who has indicated his/her understanding and acceptance.   Dental advisory given  Plan Discussed with: CRNA and Surgeon  Anesthesia Plan Comments:         Anesthesia Quick Evaluation

## 2018-10-10 NOTE — Anesthesia Procedure Notes (Signed)
Anesthesia Procedure Image    

## 2018-10-10 NOTE — Anesthesia Procedure Notes (Signed)
Anesthesia Regional Block: Popliteal block   Pre-Anesthetic Checklist: ,, timeout performed, Correct Patient, Correct Site, Correct Laterality, Correct Procedure, Correct Position, site marked, Risks and benefits discussed,  Surgical consent,  Pre-op evaluation,  At surgeon's request and post-op pain management  Laterality: Right  Prep: chloraprep       Needles:  Injection technique: Single-shot  Needle Type: Echogenic Needle     Needle Length: 9cm      Additional Needles:   Procedures:,,,, ultrasound used (permanent image in chart),,,,  Narrative:  Start time: 10/10/2018 2:10 PM End time: 10/10/2018 2:16 PM Injection made incrementally with aspirations every 5 mL.  Performed by: Personally  Anesthesiologist: Myrtie Soman, MD  Additional Notes: Patient tolerated the procedure well without complications

## 2018-10-10 NOTE — Discharge Instructions (Signed)
Nonweightbearing right lower extremity. Keep splint clean and dry.  Do not remove splint. Elevate toes above nose on blanket/pillows to help control pain and swelling. Charge VAC unit nightly.

## 2018-10-11 ENCOUNTER — Other Ambulatory Visit: Payer: Self-pay

## 2018-10-11 DIAGNOSIS — S8261XA Displaced fracture of lateral malleolus of right fibula, initial encounter for closed fracture: Secondary | ICD-10-CM | POA: Diagnosis not present

## 2018-10-11 LAB — GLUCOSE, CAPILLARY
Glucose-Capillary: 161 mg/dL — ABNORMAL HIGH (ref 70–99)
Glucose-Capillary: 124 mg/dL — ABNORMAL HIGH (ref 70–99)

## 2018-10-11 MED ORDER — DOCUSATE SODIUM 100 MG PO CAPS: 100.0000 mg | ORAL_CAPSULE | Freq: Two times a day (BID) | ORAL | 1 refills | Status: DC

## 2018-10-11 MED ORDER — ASPIRIN 81 MG PO TABS: 81.0000 mg | ORAL_TABLET | Freq: Two times a day (BID) | ORAL | 1 refills | Status: DC

## 2018-10-11 MED ORDER — HYDROCODONE-ACETAMINOPHEN 5-325 MG PO TABS: 1.0000 | ORAL_TABLET | Freq: Four times a day (QID) | ORAL | 0 refills | Status: DC | PRN

## 2018-10-11 MED ORDER — ONDANSETRON HCL 4 MG PO TABS: 4.0000 mg | ORAL_TABLET | Freq: Four times a day (QID) | ORAL | 0 refills | Status: DC | PRN

## 2018-10-11 MED ORDER — SENNA 8.6 MG PO TABS: 1.0000 | ORAL_TABLET | Freq: Two times a day (BID) | ORAL | 0 refills | Status: DC

## 2018-10-11 NOTE — Evaluation (Signed)
Physical Therapy Evaluation Patient Details Name: Suzanne Watkins MRN: [id number] DOB: 27-Oct-1951 Today's Date: 10/11/2018   History of Present Illness  Open reduction internal fixation right lateral malleolus  Clinical Impression  The patient is functioning at baseline. Patient  Will benefit from  A WC with right elevating leg rest. As any distance ambulating > 50' is a hardship.  No further PT need is indicated. Plans Dc today.    Follow Up Recommendations No PT follow up    Equipment Recommendations  Wheelchair (measurements PT)    Recommendations for Other Services       Precautions / Restrictions Precautions Precautions: Fall Restrictions RLE Weight Bearing: Non weight bearing      Mobility  Bed Mobility Overal bed mobility: Needs Assistance                Transfers Overall transfer level: Needs assistance Equipment used: Standard walker Transfers: Sit to/from Stand Sit to Stand: Min guard            Ambulation/Gait Ambulation/Gait assistance: Min guard Gait Distance (Feet): 30 Feet Assistive device: Standard walker Gait Pattern/deviations: Step-to pattern     General Gait Details: maintained NWB on Right  Stairs Stairs: Yes       General stair comments: pt declined  Wheelchair Mobility    Modified Rankin (Stroke Patients Only)       Balance                                             Pertinent Vitals/Pain Pain Assessment: 0-10 Pain Score: 4  Pain Location: right ankle Pain Descriptors / Indicators: Aching Pain Intervention(s): Monitored during session;Repositioned;Ice applied    Home Living Family/patient expects to be discharged to:: Private residence Living Arrangements: Spouse/significant other Available Help at Discharge: Family Type of Home: House Home Access: Stairs to enter Entrance Stairs-Rails: None Technical brewer of Steps: 3 Home Layout: One level Home Equipment: Environmental consultant - standard       Prior Function Level of Independence: Needs assistance   Gait / Transfers Assistance Needed: ambulates with  SW, used crutch and HHA to go negotiate steps           Hand Dominance        Extremity/Trunk Assessment   Upper Extremity Assessment Upper Extremity Assessment: Overall WFL for tasks assessed    Lower Extremity Assessment Lower Extremity Assessment: RLE deficits/detail RLE Deficits / Details: SLC    Cervical / Trunk Assessment Cervical / Trunk Assessment: Normal  Communication      Cognition Arousal/Alertness: Awake/alert Behavior During Therapy: WFL for tasks assessed/performed Overall Cognitive Status: Within Functional Limits for tasks assessed                                        General Comments      Exercises     Assessment/Plan    PT Assessment Patent does not need any further PT services  PT Problem List Decreased activity tolerance;Decreased mobility       PT Treatment Interventions      PT Goals (Current goals can be found in the Care Plan section)  Acute Rehab PT Goals Patient Stated Goal: go home PT Goal Formulation: All assessment and education complete, DC therapy    Frequency  Barriers to discharge        Co-evaluation               AM-PAC PT "6 Clicks" Mobility  Outcome Measure Help needed turning from your back to your side while in a flat bed without using bedrails?: None Help needed moving from lying on your back to sitting on the side of a flat bed without using bedrails?: None Help needed moving to and from a bed to a chair (including a wheelchair)?: A Lot Help needed standing up from a chair using your arms (e.g., wheelchair or bedside chair)?: A Lot Help needed to walk in hospital room?: A Lot Help needed climbing 3-5 steps with a railing? : Total 6 Click Score: 15    End of Session   Activity Tolerance: Patient tolerated treatment well Patient left: in bed;with call bell/phone  within reach;with family/visitor present Nurse Communication: Mobility status PT Visit Diagnosis: Unsteadiness on feet (R26.81)    Time: 8295-6213 PT Time Calculation (min) (ACUTE ONLY): 24 min   Charges:   PT Evaluation $PT Eval Low Complexity: 1 Low PT Treatments $Gait Training: 8-22 mins        [name] PT Acute Rehabilitation Services Pager [phone] Office [phone]   [name] 10/11/2018, 11:04 AM

## 2018-10-11 NOTE — Progress Notes (Addendum)
    Subjective:  Patient reports pain as mild to moderate.  Denies N/V/CP/SOB. No c/o.  Objective:   VITALS:   Vitals:   10/10/18 2208 10/11/18 0109 10/11/18 0509 10/11/18 0952  BP: (!) 118/53 (!) 123/57 (!) 126/59 121/65  Pulse: 76 86 72 65  Resp: 16 16 16 15   Temp: 98.4 F (36.9 C) 98.8 F (37.1 C) 98.1 F (36.7 C) 98.2 F (36.8 C)  TempSrc: Oral Oral  Oral  SpO2: 100% 100% 100% 100%  Weight:      Height:        NAD ABD soft Intact pulses distally Incision: dressing C/D/I Compartment soft Subj sensory change SP, intact S/S/DP - same as poreop Splint C/D/I Prevena intact  Lab Results  Component Value Date   WBC 10.6 (H) 10/10/2018   HGB 11.6 (L) 10/10/2018   HCT 35.0 (L) 10/10/2018   MCV 94.1 10/10/2018   PLT 211 10/10/2018   BMET    Component Value Date/Time   NA 136 09/20/2018 1103   K 5.2 09/20/2018 1103   CL 96 09/20/2018 1103   CO2 21 09/20/2018 1103   GLUCOSE 154 (H) 09/20/2018 1103   GLUCOSE 111 (H) 08/08/2016 0731   BUN 21 09/20/2018 1103   CREATININE 0.79 10/10/2018 1951   CREATININE 0.76 08/08/2016 0731   CALCIUM 10.1 09/20/2018 1103   GFRNONAA >60 10/10/2018 1951   GFRNONAA >89 08/20/2015 1132   GFRAA >60 10/10/2018 1951   GFRAA >89 08/20/2015 1132     Assessment/Plan: 1 Day Post-Op   Principal Problem:   Displaced fracture of distal end of right fibula   NWB RLE with walker Prevena VAC DVT ppx: Aspirin + brillinta, SCDs, TEDS PO pain control PT/OT Dispo: D/C home, convert VAC unit to portable prevena unit upon d/c   Hilton Cork Dominick Zertuche 10/11/2018, 10:16 AM   Rod Can, MD Cell: [phone] [place] [facility]  [place] [address], [place], [place] [address] Phone: [phone] [email or web address] Facebook  [employer]

## 2018-10-11 NOTE — Discharge Summary (Signed)
Physician Discharge Summary  Patient ID: Suzanne Watkins MRN: [id number] DOB/AGE: May 13, 1951 67 y.o.  Admit date: 10/10/2018 Discharge date: 10/11/2018  Admission Diagnoses:  Displaced fracture of distal end of right fibula  Discharge Diagnoses:  Principal Problem:   Displaced fracture of distal end of right fibula   Past Medical History:  Diagnosis Date  . Arthritis    "probably in my hands" (09/04/2013)  . 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 Cervical cancer ([place]) 1986  . Coronary atherosclerosis of native coronary artery 09/04/2013   RCA and obtuse marginal stent/DES-2010  . Fibromyalgia    "dx'd in 1994"  . Full dentures   . GERD (gastroesophageal reflux disease)   . Hyperlipidemia   . Hypertension   . Obesity   . Shortness of breath    "just related to angina I was having recently" (09/04/2013)  . Type II diabetes mellitus ([place])    followed by pcp  . Wears glasses     Surgeries: Procedure(s): OPEN REDUCTION INTERNAL FIXATION (ORIF) ANKLE FRACTURE on 10/10/2018   Consultants (if any):   Discharged Condition: Improved  Hospital Course: Suzanne Watkins is an 67 y.o. female who was admitted 10/10/2018 with a diagnosis of Displaced fracture of distal end of right fibula and went to the operating room on 10/10/2018 and underwent the above named procedures.    She was given perioperative antibiotics:  Anti-infectives (From admission, onward)   Start     Dose/Rate Route Frequency Ordered Stop   10/11/18 0600  ceFAZolin (ANCEF) IVPB 2g/100 mL premix     2 g 200 mL/hr over 30 Minutes Intravenous On call to O.R. 10/10/18 1242 10/10/18 1535   10/10/18 2100  ceFAZolin (ANCEF) IVPB 2g/100 mL premix     2 g 200 mL/hr over 30 Minutes Intravenous Every 6 hours 10/10/18 1920 10/11/18 0851    .  She was given sequential compression devices, early ambulation, and ASA + brillinta for DVT prophylaxis.  NWB RLE with splint and prevena VAC.  She benefited maximally from the  hospital stay and there were no complications.    Recent vital signs:  Vitals:   10/11/18 0509 10/11/18 0952  BP: (!) 126/59 121/65  Pulse: 72 65  Resp: 16 15  Temp: 98.1 F (36.7 C) 98.2 F (36.8 C)  SpO2: 100% 100%    Recent laboratory studies:  Lab Results  Component Value Date   HGB 11.6 (L) 10/10/2018   HGB 13.2 09/20/2018   HGB 12.7 03/16/2018   Lab Results  Component Value Date   WBC 10.6 (H) 10/10/2018   PLT 211 10/10/2018   Lab Results  Component Value Date   INR 0.95 08/03/2016   Lab Results  Component Value Date   NA 136 09/20/2018   K 5.2 09/20/2018   CL 96 09/20/2018   CO2 21 09/20/2018   BUN 21 09/20/2018   CREATININE 0.79 10/10/2018   GLUCOSE 154 (H) 09/20/2018    Discharge Medications:   Allergies as of 10/11/2018      Reactions   Plavix [clopidogrel Bisulfate] Other (See Comments)   Stomach upset   Adhesive [tape] Rash      Medication List    TAKE these medications   amLODipine 5 MG tablet Commonly known as:  NORVASC TAKE 1 TABLET BY MOUTH DAILY What changed:  when to take this   aspirin 81 MG tablet Take 1 tablet (81 mg total) by mouth 2 (two) times daily after a meal.   atorvastatin 80  MG tablet Commonly known as:  LIPITOR Take 1 tablet (80 mg total) by mouth daily. What changed:  when to take this   BRILINTA 90 MG Tabs tablet Generic drug:  ticagrelor TAKE 1 TABLET BY MOUTH TWICE DAILY What changed:  how much to take   CALCIUM ACETATE PO Take 2 tablets by mouth 2 (two) times daily.   diphenhydrAMINE 50 MG capsule Commonly known as:  BENADRYL Take 50 mg by mouth at bedtime.   docusate sodium 100 MG capsule Commonly known as:  COLACE Take 1 capsule (100 mg total) by mouth 2 (two) times daily.   ezetimibe 10 MG tablet Commonly known as:  ZETIA TAKE 1 TABLET BY MOUTH EVERY DAY What changed:  when to take this   HYDROcodone-acetaminophen 5-325 MG tablet Commonly known as:  NORCO/VICODIN Take 1-2 tablets by mouth  every 6 (six) hours as needed for moderate pain (pain score 4-6).   metoprolol succinate 25 MG 24 hr tablet Commonly known as:  TOPROL-XL Take 1 tablet (25 mg total) by mouth daily. What changed:  when to take this   multivitamin with minerals Tabs tablet Take 1 tablet by mouth daily.   nitroGLYCERIN 0.4 MG SL tablet Commonly known as:  NITROSTAT Place 1 tablet (0.4 mg total) under the tongue every 5 (five) minutes as needed for chest pain.   omeprazole 20 MG capsule Commonly known as:  PRILOSEC Take 20 mg by mouth every morning.   ondansetron 4 MG tablet Commonly known as:  ZOFRAN Take 1 tablet (4 mg total) by mouth every 6 (six) hours as needed for nausea.   ONE TOUCH ULTRA TEST test strip Generic drug:  glucose blood USE AS DIRECTED   glucose blood test strip Use as instructed. One touch ultra meter.   OVER THE COUNTER MEDICATION Take 680 mg by mouth at bedtime. Elemental Strontium   senna 8.6 MG Tabs tablet Commonly known as:  SENOKOT Take 1 tablet (8.6 mg total) by mouth 2 (two) times daily.   SitaGLIPtin-MetFORMIN HCl 50-1000 MG Tb24 Take 2 tablets by mouth every morning. What changed:    how much to take  when to take this   valsartan 320 MG tablet Commonly known as:  DIOVAN TAKE 1 TABLET(320 MG) BY MOUTH DAILY What changed:    how much to take  how to take this  when to take this       Diagnostic Studies: Dg Ankle 2 Views Right  Result Date: 10/10/2018 CLINICAL DATA:  Open reduction internal fixation of right ankle fracture. EXAM: RIGHT ANKLE - 2 VIEW COMPARISON:  09/28/2018 FINDINGS: Intraoperative fluoroscopic images were obtained. Lateral plate screw fixation to the distal fibular fracture. Fixation device along the medial distal tibia compatible with a ankle syndesmosis. Ankle is located. Prominent calcaneal spur. IMPRESSION: Open reduction internal fixation of right ankle fracture. Electronically Signed   By: Markus Daft M.D.   On: 10/10/2018  17:06   Dg Ankle Complete Right  Result Date: 09/28/2018 CLINICAL DATA:  Fall. EXAM: RIGHT ANKLE - COMPLETE 3+ VIEW COMPARISON:  No recent prior. FINDINGS: Soft tissue swelling noted over the lateral malleolus. Comminuted oblique fracture noted of the distal aspect of the right fibular diaphysis. Tiny bony densities noted adjacent to the medial malleolus, these could be tiny avulsion fractures. IMPRESSION: Comminuted oblique fracture of the distal aspect of the right fibular diaphysis. Tiny bony densities noted adjacent to the medial malleolus, these could represent tiny avulsion fractures Electronically Signed   By: Marcello Moores  Register   On: 09/28/2018 09:56   Dg Ankle Right Port  Result Date: 10/10/2018 CLINICAL DATA:  Postop EXAM: PORTABLE RIGHT ANKLE - 2 VIEW COMPARISON:  10/10/2018, 09/28/2018 FINDINGS: Casting material limits bone detail. Interval plate and multiple screw fixation of the distal fibula across a fracture, now with anatomic alignment. Button anchor along the cortex of the distal tibia. Mortise symmetric. IMPRESSION: Interval surgical fixation of distal fibular fracture. Electronically Signed   By: Donavan Foil M.D.   On: 10/10/2018 18:38   Dg C-arm 1-60 Min-no Report  Result Date: 10/10/2018 Fluoroscopy was utilized by the requesting physician.  No radiographic interpretation.    Disposition:   Discharge Instructions    Call MD / Call 911   Complete by:  As directed    If you experience chest pain or shortness of breath, CALL 911 and be transported to the hospital emergency room.  If you develope a fever above 101 F, pus (white drainage) or increased drainage or redness at the wound, or calf pain, call your surgeon's office.   Constipation Prevention   Complete by:  As directed    Drink plenty of fluids.  Prune juice may be helpful.  You may use a stool softener, such as Colace (over the counter) 100 mg twice a day.  Use MiraLax (over the counter) for constipation as  needed.   Diet - low sodium heart healthy   Complete by:  As directed    Discharge instructions   Complete by:  As directed    Non weight bearing right leg Keep splint clean and dry. Do not remove Charge VAC unit nightly Elevate toes above nose Ice per instructions   Driving restrictions   Complete by:  As directed    No driving for 12 weeks   Increase activity slowly as tolerated   Complete by:  As directed    Lifting restrictions   Complete by:  As directed    No lifting for 12 weeks      Follow-up Information    Karon Heckendorn, Aaron Edelman, MD. Schedule an appointment as soon as possible for a visit on 10/16/2018.   Specialty:  Orthopedic Surgery Why:  [facility] unit removal Contact information: [address] [place] [address] [phone]            Signed: Hilton Cork Cassadie Pankonin 10/11/2018, 4:40 PM

## 2018-10-11 NOTE — Care Management Note (Signed)
Case Management Note  Patient Details  Name: Suzanne Watkins MRN: [id number] Date of Birth: [birth year]  Subjective/Objective:  Spoke with patient at bedside. She does not feel she will need any HH, she has been doing well at home with the assistance of her spouse. She has a RW, declines a 3n1. Spoke with PT who agrees no HHPT. Has Provena wound vac so no HH needs as this does not require changing.                  Action/Plan: No needs.   Expected Discharge Date:  10/11/18               Expected Discharge Plan:  Home/Self Care  In-House Referral:     Discharge planning Services  CM Consult  Post Acute Care Choice:  NA Choice offered to:  Patient  DME Arranged:  N/A DME Agency:  NA  HH Arranged:  NA HH Agency:  NA  Status of Service:  Completed, signed off  If discussed at [place] of Stay Meetings, dates discussed:    Additional Comments:  Guadalupe Maple, RN 10/11/2018, 11:04 AM

## 2018-10-12 ENCOUNTER — Encounter (HOSPITAL_COMMUNITY): Payer: Self-pay | Admitting: Orthopedic Surgery

## 2018-10-14 DIAGNOSIS — Z87891 Personal history of nicotine dependence: Secondary | ICD-10-CM | POA: Insufficient documentation

## 2018-10-14 NOTE — Telephone Encounter (Signed)
Saw form today but pt already had surgery 4d ago and is doing well so is NA - was cleared by cardiology.

## 2018-10-18 ENCOUNTER — Encounter: Payer: Self-pay | Admitting: Family Medicine

## 2018-11-19 ENCOUNTER — Other Ambulatory Visit: Payer: Self-pay | Admitting: Family Medicine

## 2018-11-19 DIAGNOSIS — Z1231 Encounter for screening mammogram for malignant neoplasm of breast: Secondary | ICD-10-CM

## 2018-12-19 ENCOUNTER — Other Ambulatory Visit: Payer: Self-pay | Admitting: Interventional Cardiology

## 2018-12-25 ENCOUNTER — Other Ambulatory Visit: Payer: Self-pay | Admitting: Interventional Cardiology

## 2018-12-27 ENCOUNTER — Ambulatory Visit
Admission: RE | Admit: 2018-12-27 | Discharge: 2018-12-27 | Disposition: A | Discharge status: Home / Self Care | Payer: Medicare Other | Source: Ambulatory Visit | Attending: Family Medicine | Admitting: Family Medicine

## 2018-12-27 DIAGNOSIS — Z1231 Encounter for screening mammogram for malignant neoplasm of breast: Secondary | ICD-10-CM

## 2018-12-27 DIAGNOSIS — E2839 Other primary ovarian failure: Secondary | ICD-10-CM

## 2018-12-28 ENCOUNTER — Other Ambulatory Visit: Payer: Self-pay | Admitting: Family Medicine

## 2018-12-28 DIAGNOSIS — R928 Other abnormal and inconclusive findings on diagnostic imaging of breast: Secondary | ICD-10-CM

## 2019-01-01 ENCOUNTER — Telehealth: Payer: Self-pay | Admitting: Family Medicine

## 2019-01-01 NOTE — Progress Notes (Signed)
Cardiology Office Note   Date:  01/02/2019   ID:  Suzanne Watkins, DOB 08-May-1951, MRN [id number]  PCP:  Suzanne Knapp, MD    No chief complaint on file.  CAD  Wt Readings from Last 3 Encounters:  01/02/19 171 lb 9.6 oz (77.8 kg)  10/10/18 175 lb (79.4 kg)  10/08/18 175 lb (79.4 kg)       History of Present Illness: Suzanne Watkins is a 68 y.o. female  Who has had CAD. She has had stents in all three coronary vessels. Most recent were in 11/14.  She used to walk a lot on the [employer]  She retired in Jan 2019, so she joined a senior center to exercise on the treadmill.  She had a broken leg in 11/19.  She had surgery in December 12/19.  She did PT.  No cardiac issues at that time.   Denies : Chest pain. Dizziness. Leg edema. Nitroglycerin use. Orthopnea. Palpitations. Paroxysmal nocturnal dyspnea. Shortness of breath. Syncope.   Exercise has been limited.    Past Medical History:  Diagnosis Date  . Arthritis    "probably in my hands" (09/04/2013)  . 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 Cervical cancer ([place]) 1986  . Coronary atherosclerosis of native coronary artery 09/04/2013   RCA and obtuse marginal stent/DES-2010  . Fibromyalgia    "dx'd in 1994"  . Full dentures   . GERD (gastroesophageal reflux disease)   . Hyperlipidemia   . Hypertension   . Obesity   . Shortness of breath    "just related to angina I was having recently" (09/04/2013)  . Type II diabetes mellitus ([place])    followed by pcp  . Wears glasses     Past Surgical History:  Procedure Laterality Date  . CARPAL TUNNEL RELEASE Right 07/1992  . CERVICAL CONE BIOPSY  03/1985  . CORONARY ANGIOPLASTY WITH STENT PLACEMENT  07/2009, 08/2009; 09/04/2013   "1+1+2; total of 4" (09/04/2013)  . LEFT HEART CATHETERIZATION WITH CORONARY ANGIOGRAM N/A 09/04/2013   Procedure: LEFT HEART CATHETERIZATION WITH CORONARY ANGIOGRAM;  Surgeon: Candee Furbish, MD;  Location: [facility] CATH LAB;  Service: Cardiovascular;   Laterality: N/A;  . MOHS SURGERY Left 05/2008   "nostril"  . ORIF ANKLE FRACTURE Right 10/10/2018   Procedure: OPEN REDUCTION INTERNAL FIXATION (ORIF) ANKLE FRACTURE;  Surgeon: Rod Can, MD;  Location: [facility] ORS;  Service: Orthopedics;  Laterality: Right;  . SHOULDER OPEN ROTATOR CUFF REPAIR Right 10/2004   "got 5 pins in" (09/04/2013)  . TUBAL LIGATION Bilateral 1979     Current Outpatient Medications  Medication Sig Dispense Refill  . amLODipine (NORVASC) 5 MG tablet TAKE 1 TABLET BY MOUTH DAILY (Patient taking differently: Take 5 mg by mouth every evening. ) 180 tablet 3  . atorvastatin (LIPITOR) 80 MG tablet Take 1 tablet (80 mg total) by mouth daily at 6 PM. Please keep upcoming appt in March with Dr. Irish Lack for future refills. Thank you 90 tablet 0  . Calcium Acetate, Phos Binder, (CALCIUM ACETATE PO) Take 2 tablets by mouth 2 (two) times daily.    . diphenhydrAMINE (BENADRYL) 50 MG capsule Take 50 mg by mouth at bedtime.     Marland Kitchen ezetimibe (ZETIA) 10 MG tablet Take 1 tablet (10 mg total) by mouth daily. Please keep upcoming appt in March with Dr. Irish Lack for future refills. Thank you 90 tablet 0  . glucose blood test strip Use as instructed. One touch ultra meter. 100 each 11  .  metoprolol succinate (TOPROL-XL) 25 MG 24 hr tablet Take 1 tablet (25 mg total) by mouth daily. (Patient taking differently: Take 25 mg by mouth every morning. ) 90 tablet 3  . Multiple Vitamin (MULTIVITAMIN WITH MINERALS) TABS tablet Take 1 tablet by mouth daily.    . nitroGLYCERIN (NITROSTAT) 0.4 MG SL tablet Place 1 tablet (0.4 mg total) under the tongue every 5 (five) minutes as needed for chest pain. 25 tablet 3  . omeprazole (PRILOSEC) 20 MG capsule Take 20 mg by mouth every morning.     . ONE TOUCH ULTRA TEST test strip USE AS DIRECTED 100 each 0  . OVER THE COUNTER MEDICATION Take 680 mg by mouth at bedtime. Elemental Strontium    . SitaGLIPtin-MetFORMIN HCl 50-1000 MG TB24 Take 2 tablets by mouth  every morning. (Patient taking differently: Take 1 tablet by mouth 2 (two) times daily. ) 180 tablet 1  . ticagrelor (BRILINTA) 90 MG TABS tablet Take 1 tablet (90 mg total) by mouth 2 (two) times daily. Please keep upcoming appt in March with Dr. Irish Lack for future refills. Thank you 180 tablet 0  . valsartan (DIOVAN) 320 MG tablet TAKE 1 TABLET(320 MG) BY MOUTH DAILY (Patient taking differently: Take 320 mg by mouth daily. TAKE 1 TABLET(320 MG) BY MOUTH DAILY) 90 tablet 3   No current facility-administered medications for this visit.     Allergies:   Plavix [clopidogrel bisulfate] and Adhesive [tape]    Social History:  The patient  reports that she quit smoking about 12 years ago. Her smoking use included cigarettes. She has a 29.00 pack-year smoking history. She has never used smokeless tobacco. She reports that she does not drink alcohol or use drugs.   Family History:  The patient's family history includes Cancer in her father; Diabetes in her daughter; Drug abuse in her brother and daughter; Heart disease in her daughter and paternal grandmother; Heart disease (age of onset: 48) in her mother; Hyperlipidemia in her brother and mother; Hypertension in her brother; Lupus in her daughter; Stroke in her paternal grandfather.    ROS:  Please see the history of present illness.   Otherwise, review of systems are positive for fall on Thanksgiving causing the broken ankle.   All other systems are reviewed and negative.    PHYSICAL EXAM: VS:  BP 118/66   Pulse 70   Ht 5' 2.5" (1.588 m)   Wt 171 lb 9.6 oz (77.8 kg)   SpO2 93%   BMI 30.89 kg/m  , BMI Body mass index is 30.89 kg/m. GEN: Well nourished, well developed, in no acute distress  HEENT: normal  Neck: no JVD, carotid bruits, or masses Cardiac: RRR; no murmurs, rubs, or gallops,; right ankle edema at surgery site Respiratory:  clear to auscultation bilaterally, normal work of breathing GI: soft, nontender, nondistended, + BS MS:  no deformity or atrophy ; right ankle scar Skin: warm and dry, no rash Neuro:  Strength and sensation are intact Psych: euthymic mood, full affect   EKG:   The ekg ordered in 11/19 demonstrates NSR, no ST segment   Recent Labs: 09/20/2018: ALT 34; BUN 21; Potassium 5.2; Sodium 136; TSH 5.630 10/10/2018: Creatinine, Ser 0.79; Hemoglobin 11.6; Platelets 211   Lipid Panel    Component Value Date/Time   CHOL 140 09/20/2018 1103   CHOL 127 10/28/2014 0801   TRIG 227 (H) 09/20/2018 1103   TRIG 192 (H) 10/28/2014 0801   HDL 36 (L) 09/20/2018 1103  HDL 30 (L) 10/28/2014 0801   CHOLHDL 3.9 09/20/2018 1103   CHOLHDL 3.8 08/08/2016 0731   VLDL 22 08/08/2016 0731   LDLCALC 59 09/20/2018 1103   LDLCALC 59 10/28/2014 0801     Other studies Reviewed: Additional studies/ records that were reviewed today with results demonstrating: Labs reviewed.   ASSESSMENT AND PLAN:  1. CAD/Old MI: No angina.  Continue aggressive secondary prevention.   2. DM: A1C 5.5.  Well controlled.  She gets a home A1C check test kit at home.   3. Hyperlipidemia: LDL 59 in 11/19.  COntinue lipid lowering therapy.  4. Hypertensive heart disease: The current medical regimen is effective;  continue present plan and medications.    Current medicines are reviewed at length with the patient today.  The patient concerns regarding her medicines were addressed.  The following changes have been made:  No change  Labs/ tests ordered today include:  No orders of the defined types were placed in this encounter.   Recommend 150 minutes/week of aerobic exercise Low fat, low carb, high fiber diet recommended  Disposition:   FU in 1 year   Signed, Larae Grooms, MD  01/02/2019 10:06 AM    [facility] [address], [place], [place]  [address] Phone: [phone]; Fax: [phone]

## 2019-01-01 NOTE — Telephone Encounter (Signed)
mychart message sent to pt about their appointment with Dr Shaw °

## 2019-01-02 ENCOUNTER — Ambulatory Visit: Payer: Medicare Other | Admitting: Interventional Cardiology

## 2019-01-02 ENCOUNTER — Ambulatory Visit
Admission: RE | Admit: 2019-01-02 | Discharge: 2019-01-02 | Disposition: A | Discharge status: Home / Self Care | Payer: Medicare Other | Source: Ambulatory Visit | Attending: Family Medicine | Admitting: Family Medicine

## 2019-01-02 ENCOUNTER — Ambulatory Visit: Payer: Medicare Other

## 2019-01-02 ENCOUNTER — Encounter: Payer: Self-pay | Admitting: Interventional Cardiology

## 2019-01-02 VITALS — BP 118/66 | HR 70 | Ht 62.5 in | Wt 171.6 lb

## 2019-01-02 DIAGNOSIS — R928 Other abnormal and inconclusive findings on diagnostic imaging of breast: Secondary | ICD-10-CM

## 2019-01-02 DIAGNOSIS — E1159 Type 2 diabetes mellitus with other circulatory complications: Secondary | ICD-10-CM | POA: Diagnosis not present

## 2019-01-02 DIAGNOSIS — I119 Hypertensive heart disease without heart failure: Secondary | ICD-10-CM

## 2019-01-02 DIAGNOSIS — E782 Mixed hyperlipidemia: Secondary | ICD-10-CM | POA: Diagnosis not present

## 2019-01-02 DIAGNOSIS — I2510 Atherosclerotic heart disease of native coronary artery without angina pectoris: Secondary | ICD-10-CM

## 2019-01-02 MED ORDER — NITROGLYCERIN 0.4 MG SL SUBL: 0.4000 mg | SUBLINGUAL_TABLET | SUBLINGUAL | 3 refills | Status: DC | PRN

## 2019-01-02 NOTE — Patient Instructions (Signed)

## 2019-02-19 ENCOUNTER — Other Ambulatory Visit: Payer: Self-pay | Admitting: Interventional Cardiology

## 2019-03-18 ENCOUNTER — Ambulatory Visit: Payer: Medicare Other | Admitting: Family Medicine

## 2019-03-19 ENCOUNTER — Other Ambulatory Visit: Payer: Self-pay | Admitting: Interventional Cardiology

## 2019-03-19 MED ORDER — METOPROLOL SUCCINATE ER 25 MG PO TB24: ORAL_TABLET | ORAL | 3 refills | Status: DC

## 2019-03-26 MED ORDER — VALSARTAN 320 MG PO TABS: ORAL_TABLET | ORAL | 3 refills | Status: DC

## 2019-04-02 MED ORDER — IRBESARTAN 300 MG PO TABS: 300.0000 mg | ORAL_TABLET | Freq: Every day | ORAL | 3 refills | Status: DC

## 2019-04-08 ENCOUNTER — Other Ambulatory Visit: Payer: Self-pay | Admitting: Interventional Cardiology

## 2019-04-08 MED ORDER — EZETIMIBE 10 MG PO TABS: 10.0000 mg | ORAL_TABLET | Freq: Every day | ORAL | 3 refills | Status: DC

## 2019-06-06 ENCOUNTER — Other Ambulatory Visit: Payer: Self-pay | Admitting: Interventional Cardiology

## 2019-06-06 MED ORDER — ATORVASTATIN CALCIUM 80 MG PO TABS: 80.0000 mg | ORAL_TABLET | Freq: Every day | ORAL | 2 refills | Status: DC

## 2019-11-17 ENCOUNTER — Emergency Department (HOSPITAL_COMMUNITY)
Admission: EM | Admit: 2019-11-17 | Discharge: 2019-11-18 | Disposition: A | Discharge status: Home / Self Care | Payer: Medicare PPO | Attending: Emergency Medicine | Admitting: Emergency Medicine

## 2019-11-17 ENCOUNTER — Other Ambulatory Visit: Payer: Self-pay

## 2019-11-17 DIAGNOSIS — I10 Essential (primary) hypertension: Secondary | ICD-10-CM | POA: Insufficient documentation

## 2019-11-17 DIAGNOSIS — I2510 Atherosclerotic heart disease of native coronary artery without angina pectoris: Secondary | ICD-10-CM | POA: Insufficient documentation

## 2019-11-17 DIAGNOSIS — E119 Type 2 diabetes mellitus without complications: Secondary | ICD-10-CM | POA: Insufficient documentation

## 2019-11-17 DIAGNOSIS — R000 Tachycardia, unspecified: Secondary | ICD-10-CM | POA: Diagnosis present

## 2019-11-17 DIAGNOSIS — Z79899 Other long term (current) drug therapy: Secondary | ICD-10-CM | POA: Insufficient documentation

## 2019-11-17 DIAGNOSIS — R0602 Shortness of breath: Secondary | ICD-10-CM | POA: Diagnosis not present

## 2019-11-17 DIAGNOSIS — Z87891 Personal history of nicotine dependence: Secondary | ICD-10-CM | POA: Diagnosis not present

## 2019-11-18 ENCOUNTER — Emergency Department (HOSPITAL_COMMUNITY): Payer: Medicare PPO

## 2019-11-18 ENCOUNTER — Encounter (HOSPITAL_COMMUNITY): Payer: Self-pay | Admitting: *Deleted

## 2019-11-18 ENCOUNTER — Other Ambulatory Visit: Payer: Self-pay

## 2019-11-18 LAB — BASIC METABOLIC PANEL
Anion gap: 14 (ref 5–15)
BUN: 12 mg/dL (ref 8–23)
CO2: 18 mmol/L — ABNORMAL LOW (ref 22–32)
Calcium: 9.1 mg/dL (ref 8.9–10.3)
Chloride: 107 mmol/L (ref 98–111)
Creatinine, Ser: 0.95 mg/dL (ref 0.44–1.00)
GFR calc Af Amer: >60 mL/min (ref >60)
GFR calc non Af Amer: >60 mL/min (ref >60)
Glucose, Bld: 268 mg/dL — ABNORMAL HIGH (ref 70–99)
Potassium: 3.4 mmol/L — ABNORMAL LOW (ref 3.5–5.1)
Sodium: 139 mmol/L (ref 135–145)

## 2019-11-18 LAB — TROPONIN I (HIGH SENSITIVITY)
Troponin I (High Sensitivity): 5 ng/L (ref <18)
Troponin I (High Sensitivity): 6 ng/L (ref <18)

## 2019-11-18 LAB — CBC
HCT: 39.5 % (ref 36.0–46.0)
Hemoglobin: 13.5 g/dL (ref 12.0–15.0)
MCH: 31.6 pg (ref 26.0–34.0)
MCHC: 34.2 g/dL (ref 30.0–36.0)
MCV: 92.5 fL (ref 80.0–100.0)
Platelets: 210 10*3/uL (ref 150–400)
RBC: 4.27 MIL/uL (ref 3.87–5.11)
RDW: 13.2 % (ref 11.5–15.5)
WBC: 10.6 10*3/uL — ABNORMAL HIGH (ref 4.0–10.5)
nRBC: 0 % (ref 0.0–0.2)

## 2019-11-18 LAB — D-DIMER, QUANTITATIVE: D-Dimer, Quant: 1 ug/mL-FEU — ABNORMAL HIGH (ref 0.00–0.50)

## 2019-11-18 MED ORDER — SODIUM CHLORIDE 0.9% FLUSH
3.0000 mL | Freq: Once | INTRAVENOUS | Status: AC
  Administered 2019-11-18: 06:00:00 3 mL via INTRAVENOUS

## 2019-11-18 MED ORDER — IOHEXOL 350 MG/ML SOLN
100.0000 mL | Freq: Once | INTRAVENOUS | Status: AC
  Administered 2019-11-18: 07:00:00 66 mL via INTRAVENOUS

## 2019-11-18 NOTE — ED Provider Notes (Signed)
[facility] EMERGENCY DEPARTMENT Provider Note   CSN: [id number] Arrival date & time: 11/17/19  2339     History Chief Complaint  Patient presents with  . Tachycardia    Suzanne Watkins is a 70 y.o. female.  The history is provided by the patient.  Palpitations Palpitations quality:  Fast Onset quality:  Sudden Timing:  Constant Progression:  Resolved (now pulse is variable) Chronicity:  New Context: exercise   Context comment:  40 minute walk Relieved by:  Nothing Worsened by:  Nothing Ineffective treatments:  None tried Associated symptoms: no back pain, no chest pressure, no cough, no diaphoresis, no dizziness, no hemoptysis, no leg pain, no lower extremity edema, no malaise/fatigue, no nausea, no near-syncope, no numbness, no orthopnea, no PND, no syncope, no vomiting and no weakness   Risk factors: no hx of PE   Patient was out for a walk and felt pulse speed up.  She stopped and it subsided but now feels her HR is too variable.  No cough.  No CP, no n/v/d.      Past Medical History:  Diagnosis Date  . Arthritis    "probably in my hands" (09/04/2013)  . 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 Cervical cancer ([place]) 1986  . Coronary atherosclerosis of native coronary artery 09/04/2013   RCA and obtuse marginal stent/DES-2010  . Fibromyalgia    "dx'd in 1994"  . Full dentures   . GERD (gastroesophageal reflux disease)   . Hyperlipidemia   . Hypertension   . Obesity   . Shortness of breath    "just related to angina I was having recently" (09/04/2013)  . Type II diabetes mellitus ([place])    followed by pcp  . Wears glasses     Patient Active Problem List   Diagnosis Date Noted  . History of tobacco use 10/14/2018  . Displaced fracture of distal end of right fibula 10/10/2018  . Hypertensive heart disease 12/14/2017  . Unstable angina ([place]) 09/04/2013  . Coronary atherosclerosis of native coronary artery 09/04/2013  . Old myocardial  infarction 09/04/2013  . Hypertension   . Hyperlipidemia   . GERD (gastroesophageal reflux disease)   . Obesity   . Fibromyalgia   . Diabetes ([place]) 05/18/2013    Past Surgical History:  Procedure Laterality Date  . CARPAL TUNNEL RELEASE Right 07/1992  . CERVICAL CONE BIOPSY  03/1985  . CORONARY ANGIOPLASTY WITH STENT PLACEMENT  07/2009, 08/2009; 09/04/2013   "1+1+2; total of 4" (09/04/2013)  . LEFT HEART CATHETERIZATION WITH CORONARY ANGIOGRAM N/A 09/04/2013   Procedure: LEFT HEART CATHETERIZATION WITH CORONARY ANGIOGRAM;  Surgeon: Candee Furbish, MD;  Location: [facility] CATH LAB;  Service: Cardiovascular;  Laterality: N/A;  . MOHS SURGERY Left 05/2008   "nostril"  . ORIF ANKLE FRACTURE Right 10/10/2018   Procedure: OPEN REDUCTION INTERNAL FIXATION (ORIF) ANKLE FRACTURE;  Surgeon: Rod Can, MD;  Location: [facility] ORS;  Service: Orthopedics;  Laterality: Right;  . SHOULDER OPEN ROTATOR CUFF REPAIR Right 10/2004   "got 5 pins in" (09/04/2013)  . TUBAL LIGATION Bilateral 1979     OB History   No obstetric history on file.     Family History  Problem Relation Age of Onset  . Heart disease Mother 45       AMI; cause of death  . Hyperlipidemia Mother   . Cancer Father        Throat  . Hyperlipidemia Brother   . Hypertension Brother   . Drug abuse  Brother   . Diabetes Daughter   . Drug abuse Daughter   . Heart disease Daughter   . Lupus Daughter   . Heart disease Paternal Grandmother   . Stroke Paternal Grandfather     Social History   Tobacco Use  . Smoking status: Former Smoker    Packs/day: 1.00    Years: 29.00    Pack years: 29.00    Types: Cigarettes    Quit date: 06/14/2006    Years since quitting: 13.4  . Smokeless tobacco: Never Used  Substance Use Topics  . Alcohol use: No  . Drug use: No    Home Medications Prior to Admission medications   Medication Sig Start Date End Date Taking? Authorizing Provider  amLODipine (NORVASC) 5 MG tablet TAKE 1 TABLET BY MOUTH  DAILY 03/19/19   Jettie Booze, MD  atorvastatin (LIPITOR) 80 MG tablet Take 1 tablet (80 mg total) by mouth daily at 6 PM. 06/06/19   Jettie Booze, MD  BRILINTA 90 MG TABS tablet TAKE 1 TABLET BY MOUTH TWICE DAILY 03/19/19   Jettie Booze, MD  Calcium Acetate, Phos Binder, (CALCIUM ACETATE PO) Take 2 tablets by mouth 2 (two) times daily.    [provider]  diphenhydrAMINE (BENADRYL) 50 MG capsule Take 50 mg by mouth at bedtime.     [provider]  ezetimibe (ZETIA) 10 MG tablet Take 1 tablet (10 mg total) by mouth daily. 04/08/19   Jettie Booze, MD  glucose blood test strip Use as instructed. One touch ultra meter. 02/13/17   Shawnee Knapp, MD  irbesartan (AVAPRO) 300 MG tablet Take 1 tablet (300 mg total) by mouth daily. 04/02/19   Jettie Booze, MD  metoprolol succinate (TOPROL-XL) 25 MG 24 hr tablet TAKE 1 TABLET(25 MG) BY MOUTH DAILY 03/19/19   Jettie Booze, MD  Multiple Vitamin (MULTIVITAMIN WITH MINERALS) TABS tablet Take 1 tablet by mouth daily.    [provider]  nitroGLYCERIN (NITROSTAT) 0.4 MG SL tablet Place 1 tablet (0.4 mg total) under the tongue every 5 (five) minutes as needed for chest pain. 01/02/19   Jettie Booze, MD  omeprazole (PRILOSEC) 20 MG capsule Take 20 mg by mouth every morning.     [provider]  ONE TOUCH ULTRA TEST test strip USE AS DIRECTED 09/24/16   Shawnee Knapp, MD  OVER THE COUNTER MEDICATION Take 680 mg by mouth at bedtime. Elemental Strontium    [provider]  SitaGLIPtin-MetFORMIN HCl 50-1000 MG TB24 Take 2 tablets by mouth every morning. Patient taking differently: Take 1 tablet by mouth 2 (two) times daily.  09/20/18   Shawnee Knapp, MD    Allergies    Plavix [clopidogrel bisulfate] and Adhesive [tape]  Review of Systems   Review of Systems  Constitutional: Negative for diaphoresis and malaise/fatigue.  HENT: Negative for congestion.   Eyes: Negative for visual  disturbance.  Respiratory: Negative for cough and hemoptysis.   Cardiovascular: Positive for palpitations. Negative for orthopnea, syncope, PND and near-syncope.  Gastrointestinal: Negative for nausea and vomiting.  Genitourinary: Negative for difficulty urinating.  Musculoskeletal: Negative for back pain.  Neurological: Negative for dizziness, weakness and numbness.  Psychiatric/Behavioral: Negative for agitation.  All other systems reviewed and are negative.   Physical Exam Updated Vital Signs BP (!) 169/75   Pulse 90   Temp 99.5 F (37.5 C) (Oral)   Resp 17   Ht 5\' 2"  (1.575 m)   Wt  80.7 kg   SpO2 99%   BMI 32.56 kg/m   Physical Exam Vitals and nursing note reviewed.  Constitutional:      General: She is not in acute distress.    Appearance: Normal appearance.  HENT:     Head: Normocephalic and atraumatic.     Nose: Nose normal.  Eyes:     Conjunctiva/sclera: Conjunctivae normal.     Pupils: Pupils are equal, round, and reactive to light.  Cardiovascular:     Rate and Rhythm: Normal rate and regular rhythm.     Pulses: Normal pulses.     Heart sounds: Normal heart sounds.  Abdominal:     General: Abdomen is flat. Bowel sounds are normal.     Tenderness: There is no abdominal tenderness. There is no guarding or rebound.  Musculoskeletal:        General: Normal range of motion.     Cervical back: Normal range of motion and neck supple.  Skin:    General: Skin is warm and dry.     Capillary Refill: Capillary refill takes less than 2 seconds.  Neurological:     General: No focal deficit present.     Mental Status: She is alert and oriented to person, place, and time.     Deep Tendon Reflexes: Reflexes normal.  Psychiatric:        Mood and Affect: Mood normal.        Behavior: Behavior normal.     ED Results / Procedures / Treatments   Labs (all labs ordered are listed, but only abnormal results are displayed) Results for orders placed or performed during  the hospital encounter of [id number]  Basic metabolic panel  Result Value Ref Range   Sodium 139 135 - 145 mmol/L   Potassium 3.4 (L) 3.5 - 5.1 mmol/L   Chloride 107 98 - 111 mmol/L   CO2 18 (L) 22 - 32 mmol/L   Glucose, Bld 268 (H) 70 - 99 mg/dL   BUN 12 8 - 23 mg/dL   Creatinine, Ser 0.95 0.44 - 1.00 mg/dL   Calcium 9.1 8.9 - 10.3 mg/dL   GFR calc non Af Amer >60 >60 mL/min   GFR calc Af Amer >60 >60 mL/min   Anion gap 14 5 - 15  CBC  Result Value Ref Range   WBC 10.6 (H) 4.0 - 10.5 K/uL   RBC 4.27 3.87 - 5.11 MIL/uL   Hemoglobin 13.5 12.0 - 15.0 g/dL   HCT 39.5 36.0 - 46.0 %   MCV 92.5 80.0 - 100.0 fL   MCH 31.6 26.0 - 34.0 pg   MCHC 34.2 30.0 - 36.0 g/dL   RDW 13.2 11.5 - 15.5 %   Platelets 210 150 - 400 K/uL   nRBC 0.0 0.0 - 0.2 %  Troponin I (High Sensitivity)  Result Value Ref Range   Troponin I (High Sensitivity) 5 <18 ng/L   DG Chest 2 View  Result Date: 11/18/2019 CLINICAL DATA:  Initial evaluation for acute shortness of breath, chest pain. EXAM: CHEST - 2 VIEW COMPARISON:  Prior radiograph from 03/16/2018. FINDINGS: Cardiac and mediastinal silhouettes are stable, and remain within normal limits. Lungs well inflated bilaterally. No focal infiltrates, pulmonary edema, or pleural effusion. No pneumothorax. No acute osseous finding. Suture anchors noted at the right humeral head. IMPRESSION: No active cardiopulmonary disease. Electronically Signed   By: Jeannine Boga M.D.   On: 11/18/2019 00:45    EKG EKG Interpretation  Date/Time:  Sunday November 17 2019 23:50:03 EST Ventricular Rate:  75 PR Interval:  160 QRS Duration: 120 QT Interval:  454 QTC Calculation: 506 R Axis:   4 Text Interpretation: Sinus rhythm with Premature supraventricular complexes Low voltage QRS Right bundle branch block Abnormal ECG Otherwise no significant change Confirmed by Addison Lank [id number]) on 11/18/2019 1:07:24 AM   Radiology DG Chest 2 View  Result Date: 11/18/2019 CLINICAL  DATA:  Initial evaluation for acute shortness of breath, chest pain. EXAM: CHEST - 2 VIEW COMPARISON:  Prior radiograph from 03/16/2018. FINDINGS: Cardiac and mediastinal silhouettes are stable, and remain within normal limits. Lungs well inflated bilaterally. No focal infiltrates, pulmonary edema, or pleural effusion. No pneumothorax. No acute osseous finding. Suture anchors noted at the right humeral head. IMPRESSION: No active cardiopulmonary disease. Electronically Signed   By: Jeannine Boga M.D.   On: 11/18/2019 00:45    Procedures Procedures (including critical care time)  Medications Ordered in ED Medications  sodium chloride flush (NS) 0.9 % injection 3 mL (3 mLs Intravenous Given 11/18/19 0541)    ED Course  I have reviewed the triage vital signs and the nursing notes.  Pertinent labs & imaging results that were available during my care of the patient were reviewed by me and considered in my medical decision making (see chart for details).    Given the patient's age she needed a ddimer, this was elevated.  She ill need a CTA to exclude PE.  The patient has been updated on the plan.  Final Clinical Impression(s) / ED Diagnoses  Signed out to Dr. Rex Kras pending CTA   Randal Buba, [place]na Legore, MD 11/18/19 [phone]

## 2019-11-18 NOTE — ED Provider Notes (Signed)
I received this patient in signout from Dr. Randal Buba. She had presented w/ tachycardia and SOB, cardiac w//u negative. Awaiting CTA chest to r/o PE at time of signout.   CTA was negative for PE or infiltrate. Patient comfortable and well-appearing, wants to go home at reassessment. I discussed outpatient follow-up and reviewed return precautions. She voiced understanding.   Suzanne Watkins, [name], MD 11/18/19 [phone]

## 2019-11-18 NOTE — ED Notes (Signed)
Patient transported to CT 

## 2019-11-18 NOTE — ED Triage Notes (Signed)
The pt has been having periods of tachycardia+ all day aftyer she took a walk with her husband  She felt like she could not get her breath,  At present she appears anxious sob

## 2019-12-24 ENCOUNTER — Other Ambulatory Visit: Payer: Self-pay

## 2019-12-24 MED ORDER — ATORVASTATIN CALCIUM 80 MG PO TABS: 80.0000 mg | ORAL_TABLET | Freq: Every day | ORAL | 0 refills | Status: DC

## 2020-01-03 ENCOUNTER — Other Ambulatory Visit: Payer: Self-pay

## 2020-01-03 ENCOUNTER — Encounter: Payer: Self-pay | Admitting: Interventional Cardiology

## 2020-01-03 ENCOUNTER — Ambulatory Visit: Payer: Medicare PPO | Admitting: Interventional Cardiology

## 2020-01-03 VITALS — BP 128/62 | HR 87 | Ht 62.0 in | Wt 182.2 lb

## 2020-01-03 DIAGNOSIS — I119 Hypertensive heart disease without heart failure: Secondary | ICD-10-CM

## 2020-01-03 DIAGNOSIS — E1159 Type 2 diabetes mellitus with other circulatory complications: Secondary | ICD-10-CM

## 2020-01-03 DIAGNOSIS — E782 Mixed hyperlipidemia: Secondary | ICD-10-CM | POA: Diagnosis not present

## 2020-01-03 DIAGNOSIS — R002 Palpitations: Secondary | ICD-10-CM

## 2020-01-03 DIAGNOSIS — I2510 Atherosclerotic heart disease of native coronary artery without angina pectoris: Secondary | ICD-10-CM | POA: Diagnosis not present

## 2020-01-03 NOTE — Patient Instructions (Signed)
Medication Instructions:  Your physician recommends that you continue on your current medications as directed. Please refer to the Current Medication list given to you today.  *If you need a refill on your cardiac medications before your next appointment, please call your pharmacy*   Lab Work: Your physician recommends that you return for a FASTING LIPIDS, LFTS, A1C, and TSH  If you have labs (blood work) drawn today and your tests are completely normal, you will receive your results only by: Marland Kitchen MyChart Message (if you have MyChart) OR . A paper copy in the mail If you have any lab test that is abnormal or we need to change your treatment, we will call you to review the results.   Testing/Procedures: None ordered   Follow-Up: At [facility], you and your health needs are our priority.  As part of our continuing mission to provide you with exceptional heart care, we have created designated Provider Care Teams.  These Care Teams include your primary Cardiologist (physician) and Advanced Practice Providers (APPs -  Physician Assistants and Nurse Practitioners) who all work together to provide you with the care you need, when you need it.  We recommend signing up for the patient portal called "MyChart".  Sign up information is provided on this After Visit Summary.  MyChart is used to connect with patients for Virtual Visits (Telemedicine).  Patients are able to view lab/test results, encounter notes, upcoming appointments, etc.  Non-urgent messages can be sent to your provider as well.   To learn more about what you can do with MyChart, go to [email or web address].    Your next appointment:   12 month(s)  The format for your next appointment:   In Person  Provider:   You may see Larae Grooms, MD or one of the following Advanced Practice Providers on your designated Care Team:    Melina Copa, PA-C  Ermalinda Barrios, PA-C    Other Instructions

## 2020-01-03 NOTE — Progress Notes (Signed)
Cardiology Office Note   Date:  01/03/2020   ID:  Suzanne Watkins, DOB 09/24/1951, MRN [id number]  PCP:  Shawnee Knapp, MD    No chief complaint on file.  CAD  Wt Readings from Last 3 Encounters:  01/03/20 182 lb 3.2 oz (82.6 kg)  11/18/19 178 lb (80.7 kg)  01/02/19 171 lb 9.6 oz (77.8 kg)       History of Present Illness: Suzanne Watkins is a 69 y.o. female  Who has had CAD. She has had stents in all three coronary vessels. Most recent were in 11/14.  Sheused towalk a lot on the [employer] She retired in Jan 2019, so she joined a senior center to exercise on the treadmill.  She had a broken leg in 11/19.  She had surgery in December 12/19.  She did PT.  No cardiac issues at that time.   Since COVID, she hiked less, but walked in the neighborhood.    Denies : Chest pain. Dizziness. Leg edema. Nitroglycerin use. Orthopnea. Palpitations. Paroxysmal nocturnal dyspnea.  Syncope.   She had some SHOB in Jan 2021.  She has some intermittent tachycardia, when she checks her pulse ox.  CT scan: done showed no PE, mild emphysema.  No sx like what she had prior to stents.  Past Medical History:  Diagnosis Date  . Arthritis    "probably in my hands" (09/04/2013)  . 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 Cervical cancer ([place]) 1986  . Coronary atherosclerosis of native coronary artery 09/04/2013   RCA and obtuse marginal stent/DES-2010  . Fibromyalgia    "dx'd in 1994"  . Full dentures   . GERD (gastroesophageal reflux disease)   . Hyperlipidemia   . Hypertension   . Obesity   . Shortness of breath    "just related to angina I was having recently" (09/04/2013)  . Type II diabetes mellitus ([place])    followed by pcp  . Wears glasses     Past Surgical History:  Procedure Laterality Date  . CARPAL TUNNEL RELEASE Right 07/1992  . CERVICAL CONE BIOPSY  03/1985  . CORONARY ANGIOPLASTY WITH STENT PLACEMENT  07/2009, 08/2009; 09/04/2013   "1+1+2; total of 4" (09/04/2013)  .  LEFT HEART CATHETERIZATION WITH CORONARY ANGIOGRAM N/A 09/04/2013   Procedure: LEFT HEART CATHETERIZATION WITH CORONARY ANGIOGRAM;  Surgeon: Candee Furbish, MD;  Location: [facility] C[facility] LAB;  Service: Cardiovascular;  Laterality: N/A;  . MOHS SURGERY Left 05/2008   "nostril"  . ORIF ANKLE FRACTURE Right 10/10/2018   Procedure: OPEN REDUCTION INTERNAL FIXATION (ORIF) ANKLE FRACTURE;  Surgeon: Rod Can, MD;  Location: [facility] ORS;  Service: Orthopedics;  Laterality: Right;  . SHOULDER OPEN ROTATOR CUFF REPAIR Right 10/2004   "got 5 pins in" (09/04/2013)  . TUBAL LIGATION Bilateral 1979     Current Outpatient Medications  Medication Sig Dispense Refill  . amLODipine (NORVASC) 5 MG tablet TAKE 1 TABLET BY MOUTH DAILY 180 tablet 3  . atorvastatin (LIPITOR) 80 MG tablet Take 1 tablet (80 mg total) by mouth daily at 6 PM. 90 tablet 0  . BRILINTA 90 MG TABS tablet TAKE 1 TABLET BY MOUTH TWICE DAILY 180 tablet 3  . diphenhydrAMINE (BENADRYL) 50 MG capsule Take 50 mg by mouth at bedtime.     Marland Kitchen ezetimibe (ZETIA) 10 MG tablet Take 1 tablet (10 mg total) by mouth daily. 90 tablet 3  . glucose blood test strip Use as instructed. One touch ultra meter. 100 each 11  .  metoprolol succinate (TOPROL-XL) 25 MG 24 hr tablet TAKE 1 TABLET(25 MG) BY MOUTH DAILY 90 tablet 3  . Multiple Vitamin (MULTIVITAMIN WITH MINERALS) TABS tablet Take 1 tablet by mouth daily.    . nitroGLYCERIN (NITROSTAT) 0.4 MG SL tablet Place 1 tablet (0.4 mg total) under the tongue every 5 (five) minutes as needed for chest pain. 25 tablet 3  . omeprazole (PRILOSEC) 20 MG capsule Take 20 mg by mouth every morning.     . ONE TOUCH ULTRA TEST test strip USE AS DIRECTED 100 each 0  . SitaGLIPtin-MetFORMIN HCl 50-1000 MG TB24 Take 2 tablets by mouth every morning. (Patient taking differently: Take 1 tablet by mouth 2 (two) times daily. ) 180 tablet 1  . valsartan (DIOVAN) 320 MG tablet Take 320 mg by mouth daily.    . Calcium Acetate, Phos Binder,  (CALCIUM ACETATE PO) Take 2 tablets by mouth 2 (two) times daily.    Marland Kitchen OVER THE COUNTER MEDICATION Take 680 mg by mouth at bedtime. Elemental Strontium     No current facility-administered medications for this visit.    Allergies:   Plavix [clopidogrel bisulfate] and Adhesive [tape]    Social History:  The patient  reports that she quit smoking about 13 years ago. Her smoking use included cigarettes. She has a 29.00 pack-year smoking history. She has never used smokeless tobacco. She reports that she does not drink alcohol or use drugs.   Family History:  The patient's family history includes Cancer in her father; Diabetes in her daughter; Drug abuse in her brother and daughter; Heart disease in her daughter and paternal grandmother; Heart disease (age of onset: 16) in her mother; Hyperlipidemia in her brother and mother; Hypertension in her brother; Lupus in her daughter; Stroke in her paternal grandfather.    ROS:  Please see the history of present illness.   Otherwise, review of systems are positive for occasional palpitations.   All other systems are reviewed and negative.    PHYSICAL EXAM: VS:  BP 128/62   Pulse 87   Ht 5\' 2"  (1.575 m)   Wt 182 lb 3.2 oz (82.6 kg)   SpO2 97%   BMI 33.32 kg/m  , BMI Body mass index is 33.32 kg/m. GEN: Well nourished, well developed, in no acute distress  HEENT: normal  Neck: no JVD, carotid bruits, or masses Cardiac: RRR; no murmurs, rubs, or gallops,no edema  Respiratory:  clear to auscultation bilaterally, normal work of breathing GI: soft, nontender, nondistended, + BS MS: no deformity or atrophy  Skin: warm and dry, no rash Neuro:  Strength and sensation are intact Psych: euthymic mood, full affect   EKG:   The ekg ordered today demonstrates NSR, shorts runs of atrial tachycardia, RBBB, no ST segment changes   Recent Labs: 11/18/2019: BUN 12; Creatinine, Ser 0.95; Hemoglobin 13.5; Platelets 210; Potassium 3.4; Sodium 139   Lipid  Panel    Component Value Date/Time   CHOL 140 09/20/2018 1103   CHOL 127 10/28/2014 0801   TRIG 227 (H) 09/20/2018 1103   TRIG 192 (H) 10/28/2014 0801   HDL 36 (L) 09/20/2018 1103   HDL 30 (L) 10/28/2014 0801   CHOLHDL 3.9 09/20/2018 1103   CHOLHDL 3.8 08/08/2016 0731   VLDL 22 08/08/2016 0731   LDLCALC 59 09/20/2018 1103   LDLCALC 59 10/28/2014 0801     Other studies Reviewed: Additional studies/ records that were reviewed today with results demonstrating: Labs from PMD reviewed- last lipids checked in  2019.    ASSESSMENT AND PLAN:  1. CAD/Old MI: No angina on medical therapy.  The current medical regimen is effective;  continue present plan and medications. 2. DM: A1C 5.5 in 2019.  5.8 at home. Continue healthy diet. Avoid processed foods.  3. Hyperlipidemia: Needs labs checked.  COntinue statin.  4. Hypertensive heart disease: The current medical regimen is effective;  continue present plan and medications.    Current medicines are reviewed at length with the patient today.  The patient concerns regarding her medicines were addressed.  The following changes have been made:  No change  Labs/ tests ordered today include:  No orders of the defined types were placed in this encounter.   Recommend 150 minutes/week of aerobic exercise Low fat, low carb, high fiber diet recommended  Disposition:   FU in 1 year   Signed, Larae Grooms, MD  01/03/2020 9:19 AM    [facility] [place], [place], [place]  [address] Phone: [phone]; Fax: [phone]

## 2020-01-06 ENCOUNTER — Other Ambulatory Visit: Payer: Medicare PPO | Admitting: *Deleted

## 2020-01-06 ENCOUNTER — Other Ambulatory Visit: Payer: Self-pay

## 2020-01-06 DIAGNOSIS — R002 Palpitations: Secondary | ICD-10-CM

## 2020-01-06 DIAGNOSIS — I119 Hypertensive heart disease without heart failure: Secondary | ICD-10-CM

## 2020-01-06 DIAGNOSIS — E1159 Type 2 diabetes mellitus with other circulatory complications: Secondary | ICD-10-CM

## 2020-01-06 DIAGNOSIS — E782 Mixed hyperlipidemia: Secondary | ICD-10-CM

## 2020-01-06 DIAGNOSIS — I2510 Atherosclerotic heart disease of native coronary artery without angina pectoris: Secondary | ICD-10-CM

## 2020-01-06 LAB — HEPATIC FUNCTION PANEL
ALT: 41 IU/L — ABNORMAL HIGH (ref 0–32)
AST: 32 IU/L (ref 0–40)
Albumin: 4.4 g/dL (ref 3.8–4.8)
Alkaline Phosphatase: 89 IU/L (ref 39–117)
Bilirubin Total: 0.3 mg/dL (ref 0.0–1.2)
Bilirubin, Direct: 0.12 mg/dL (ref 0.00–0.40)
Total Protein: 6.5 g/dL (ref 6.0–8.5)

## 2020-01-06 LAB — LIPID PANEL
Chol/HDL Ratio: 3.2 ratio (ref 0.0–4.4)
Cholesterol, Total: 126 mg/dL (ref 100–199)
HDL: 39 mg/dL — ABNORMAL LOW (ref >39)
LDL Chol Calc (NIH): 61 mg/dL (ref 0–99)
Triglycerides: 149 mg/dL (ref 0–149)
VLDL Cholesterol Cal: 26 mg/dL (ref 5–40)

## 2020-01-06 LAB — TSH: TSH: 5.14 u[IU]/mL — ABNORMAL HIGH (ref 0.450–4.500)

## 2020-01-06 LAB — HEMOGLOBIN A1C
Est. average glucose Bld gHb Est-mCnc: 126 mg/dL
Hgb A1c MFr Bld: 6 % — ABNORMAL HIGH (ref 4.8–5.6)

## 2020-03-19 ENCOUNTER — Other Ambulatory Visit: Payer: Self-pay

## 2020-03-19 MED ORDER — EZETIMIBE 10 MG PO TABS: 10.0000 mg | ORAL_TABLET | Freq: Every day | ORAL | 2 refills | Status: DC

## 2020-03-19 MED ORDER — ATORVASTATIN CALCIUM 80 MG PO TABS: 80.0000 mg | ORAL_TABLET | Freq: Every day | ORAL | 2 refills | Status: DC

## 2020-03-19 MED ORDER — METOPROLOL SUCCINATE ER 25 MG PO TB24: ORAL_TABLET | ORAL | 2 refills | Status: DC

## 2020-03-19 MED ORDER — AMLODIPINE BESYLATE 5 MG PO TABS: 5.0000 mg | ORAL_TABLET | Freq: Every day | ORAL | 2 refills | Status: DC

## 2020-03-23 IMAGING — CR DG ANKLE COMPLETE 3+V*R*
3 series · 3 of 3 positions shown · non-contrast
Comparison: No recent prior.

CLINICAL DATA: Fall.

EXAM:
RIGHT ANKLE - COMPLETE 3+ VIEW

[ankle ap]
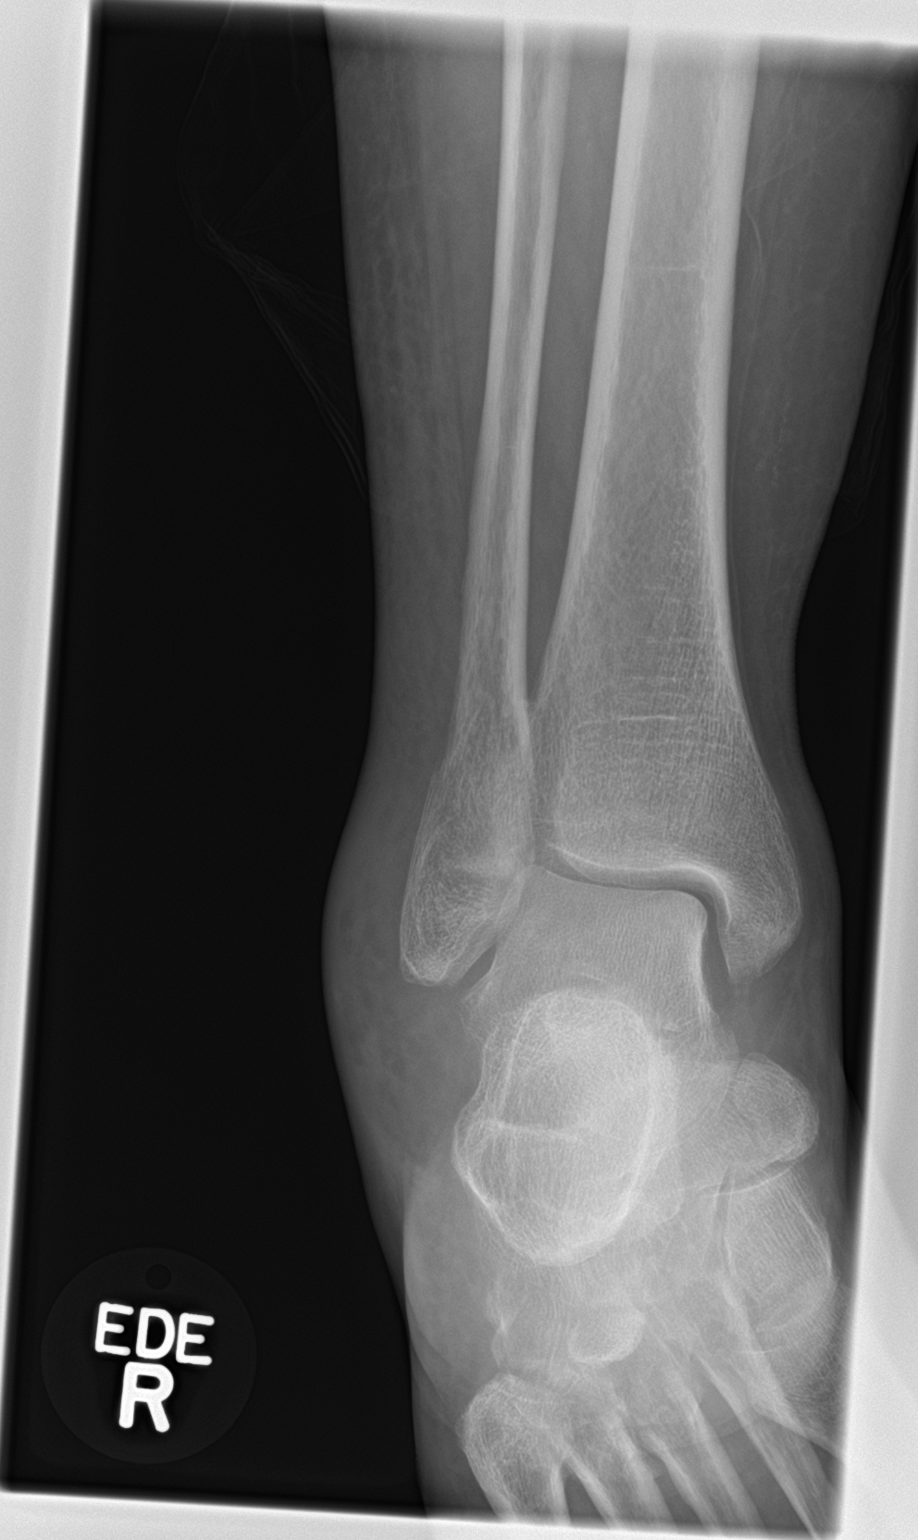

[ankle obl]
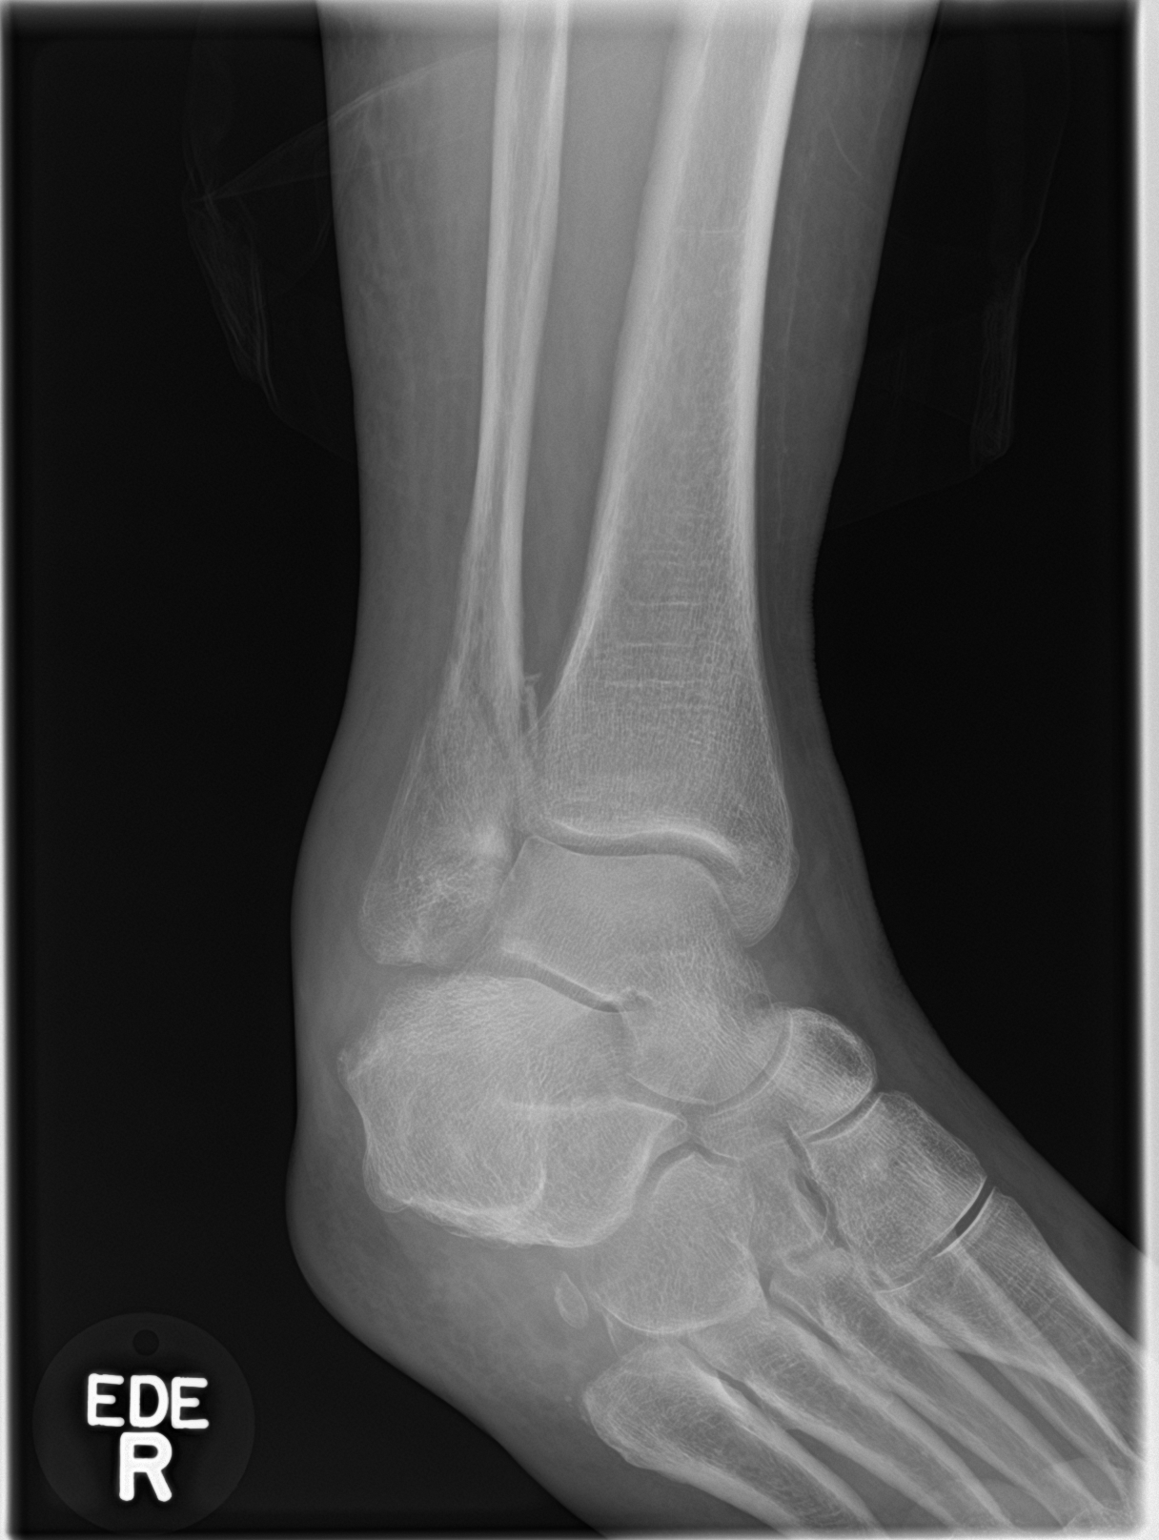

[ankle lat]
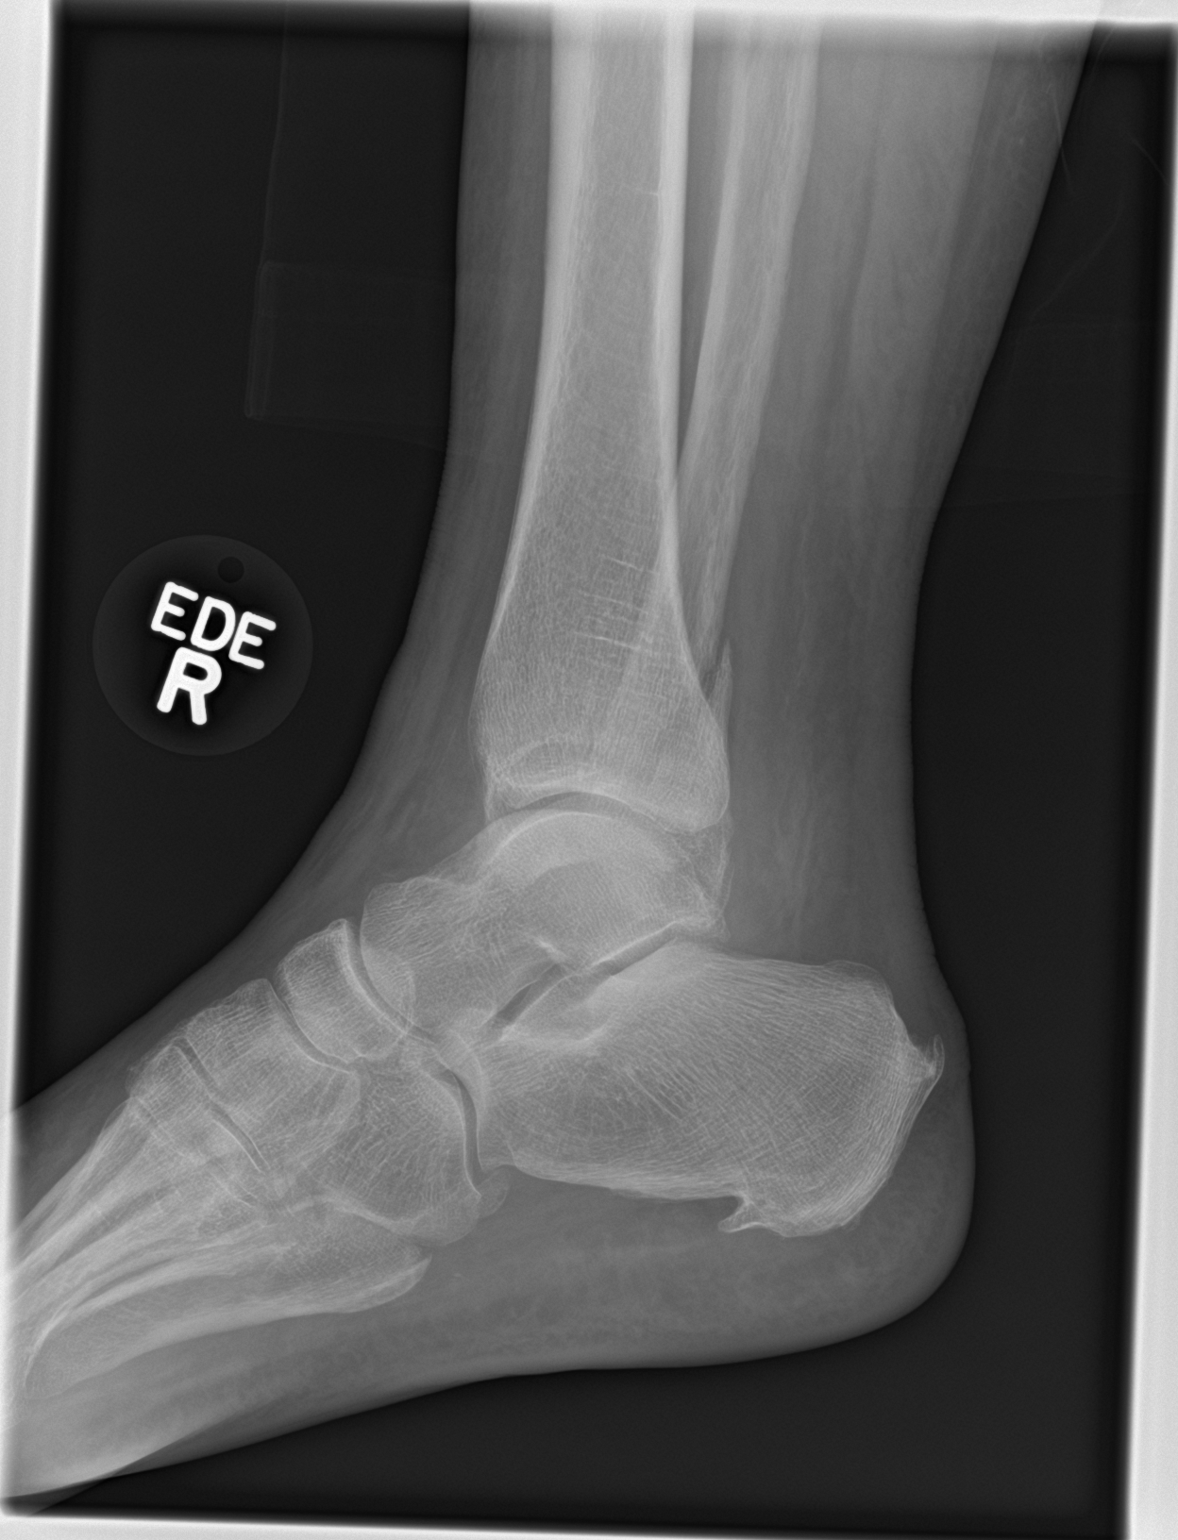

[3 of 3 positions shown; findings below may reference images not displayed]

FINDINGS: Soft tissue swelling noted over the lateral malleolus. Comminuted
oblique fracture noted of the distal aspect of the right fibular
diaphysis. Tiny bony densities noted adjacent to the medial
malleolus, these could be tiny avulsion fractures.
IMPRESSION: Comminuted oblique fracture of the distal aspect of the right
fibular diaphysis. Tiny bony densities noted adjacent to the medial
malleolus, these could represent tiny avulsion fractures

## 2020-04-14 ENCOUNTER — Other Ambulatory Visit: Payer: Self-pay

## 2020-04-14 MED ORDER — TICAGRELOR 90 MG PO TABS: 90.0000 mg | ORAL_TABLET | Freq: Two times a day (BID) | ORAL | 2 refills | Status: DC

## 2020-05-20 ENCOUNTER — Other Ambulatory Visit: Payer: Self-pay

## 2020-05-20 ENCOUNTER — Other Ambulatory Visit: Payer: Self-pay | Admitting: Registered Nurse

## 2020-05-20 ENCOUNTER — Encounter: Payer: Medicare PPO | Admitting: Registered Nurse

## 2020-05-20 ENCOUNTER — Telehealth: Payer: Self-pay | Admitting: Registered Nurse

## 2020-05-20 MED ORDER — SITAGLIP PHOS-METFORMIN HCL ER 50-1000 MG PO TB24: 2.0000 | ORAL_TABLET | ORAL | Status: DC

## 2020-05-20 NOTE — Telephone Encounter (Signed)
What is the name of the medication? Suzanne Watkins 50-1000 MG TB24 [[id number]]    Have you contacted your pharmacy to request a refill? Yes she needs an ov. She had a toc with morrow on 05/20/20, but [name] called out. Her a1c she tested at home on 05/19/20 was 5.6  Which pharmacy would you like this sent to? [facility], [address]  [place], [place] [address]  Phone:  [phone] Fax:  [phone]       Patient notified that their request is being sent to the clinical staff for review and that they should receive a call once it is complete. If they do not receive a call within 72 hours they can check with their pharmacy or our office.

## 2020-05-20 NOTE — Telephone Encounter (Signed)
We will refill until her office visit as curtsy fill.

## 2020-05-20 NOTE — Telephone Encounter (Signed)
Already sent in

## 2020-06-22 ENCOUNTER — Other Ambulatory Visit: Payer: Self-pay

## 2020-06-22 MED ORDER — VALSARTAN 320 MG PO TABS: 320.0000 mg | ORAL_TABLET | Freq: Every day | ORAL | 1 refills | Status: DC

## 2020-07-03 ENCOUNTER — Other Ambulatory Visit: Payer: Self-pay

## 2020-07-03 ENCOUNTER — Encounter: Payer: Self-pay | Admitting: Registered Nurse

## 2020-07-03 ENCOUNTER — Ambulatory Visit (INDEPENDENT_AMBULATORY_CARE_PROVIDER_SITE_OTHER): Payer: Medicare PPO | Admitting: Registered Nurse

## 2020-07-03 VITALS — BP 160/80 | HR 89 | Temp 97.9°F | Ht 62.0 in | Wt 174.4 lb

## 2020-07-03 DIAGNOSIS — E1159 Type 2 diabetes mellitus with other circulatory complications: Secondary | ICD-10-CM | POA: Diagnosis not present

## 2020-07-03 DIAGNOSIS — Z20822 Contact with and (suspected) exposure to covid-19: Secondary | ICD-10-CM | POA: Diagnosis not present

## 2020-07-03 LAB — POCT GLYCOSYLATED HEMOGLOBIN (HGB A1C): Hemoglobin A1C: 5.9 % — AB (ref 4.0–5.6)

## 2020-07-03 MED ORDER — SITAGLIP PHOS-METFORMIN HCL ER 50-1000 MG PO TB24: 2.0000 | ORAL_TABLET | ORAL | 0 refills | Status: DC

## 2020-07-03 NOTE — Telephone Encounter (Signed)
Please advise 

## 2020-07-03 NOTE — Patient Instructions (Signed)
° ° ° °  If you have lab work done today you will be contacted with your lab results within the next 2 weeks.  If you have not heard from us then please contact us. The fastest way to get your results is to register for My Chart. ° ° °IF you received an x-ray today, you will receive an invoice from [facility]. Please contact [facility] at [phone] with questions or concerns regarding your invoice.  ° °IF you received labwork today, you will receive an invoice from [facility]. Please contact [facility] at [phone] with questions or concerns regarding your invoice.  ° °Our billing staff will not be able to assist you with questions regarding bills from these companies. ° °You will be contacted with the lab results as soon as they are available. The fastest way to get your results is to activate your My Chart account. Instructions are located on the last page of this paperwork. If you have not heard from us regarding the results in 2 weeks, please contact this office. °  ° ° ° °

## 2020-07-04 LAB — SAR COV2 SEROLOGY (COVID19)AB(IGG),IA: DiaSorin SARS-CoV-2 Ab, IgG: POSITIVE

## 2020-07-07 NOTE — Telephone Encounter (Signed)
We can send these no problem.  Thank you,  Kathrin Ruddy, NP

## 2020-07-10 ENCOUNTER — Other Ambulatory Visit: Payer: Self-pay | Admitting: Registered Nurse

## 2020-07-10 ENCOUNTER — Encounter: Payer: Self-pay | Admitting: Registered Nurse

## 2020-07-10 DIAGNOSIS — E1159 Type 2 diabetes mellitus with other circulatory complications: Secondary | ICD-10-CM

## 2020-07-10 MED ORDER — ACCU-CHEK AVIVA PLUS VI STRP: ORAL_STRIP | 12 refills | Status: DC

## 2020-07-10 MED ORDER — ACCU-CHEK GUIDE VI STRP: ORAL_STRIP | 12 refills | Status: DC

## 2020-07-10 NOTE — Telephone Encounter (Signed)
Patient states that was the wrong prescription for the strips

## 2020-07-10 NOTE — Telephone Encounter (Signed)
I tried ordering the strip but can not find the ones she needs Please advise

## 2020-08-24 NOTE — Telephone Encounter (Signed)
Called and spoke to patient regarding her MyChart message. She states that she has been having chest discomfort and arm pain lately when she goes on her daily walks. She states that she took NTG x 1 with relief. Denies pain at rest or any Sx at this time. She will come in for an appointment on Wednesday with Dr. Irish Lack. She will let us know if her Sx change or worsen. She states that she has a current supply of NTG.

## 2020-08-25 NOTE — Progress Notes (Signed)
Cardiology Office Note   Date:  08/26/2020   ID:  Suzanne Watkins, DOB Jan 31, 1951, MRN [id number]  PCP:  Maximiano Coss, NP    No chief complaint on file.  CAD  Wt Readings from Last 3 Encounters:  08/26/20 177 lb (80.3 kg)  07/03/20 174 lb 6.4 oz (79.1 kg)  01/03/20 182 lb 3.2 oz (82.6 kg)       History of Present Illness: Suzanne Watkins is a 69 y.o. female  Who has had CAD. She has had stents in all three coronary vessels. Most recent were in 11/14.  Sheused towalk a lot on the [employer] She retired in Jan 2019, so she joined a senior center to exercise on the treadmill.  She had a broken leg in 11/19.She had surgery in December 12/19. She did PT. No cardiac issues at that time.   She had some SHOB in Jan 2021.  She has some intermittent tachycardia, when she checks her pulse ox.  CT scan: done showed no PE, mild emphysema.  Since the last visit, she had an episode of left arm pain with SHOB.  She used a SL NTG and had some relief.  She had one episode of chest tightness again and used SL NTG.    BP has been higher at home of late.  140s at times.     Past Medical History:  Diagnosis Date   Arthritis    "probably in my hands" (09/04/2013)   Basal cell carcinoma of nostril 05/2008   Cancer ([place])    Phreesia 05/17/2020   Cervical cancer ([place]) 1986   Coronary atherosclerosis of native coronary artery 09/04/2013   RCA and obtuse marginal stent/DES-2010   Diabetes mellitus without complication ([place])    Phreesia 05/17/2020   Fibromyalgia    "dx'd in 1994"   Full dentures    GERD (gastroesophageal reflux disease)    Hyperlipidemia    Hypertension    Myocardial infarction ([place])    Phreesia 05/17/2020   Obesity    Shortness of breath    "just related to angina I was having recently" (09/04/2013)   Type II diabetes mellitus ([place])    followed by pcp   Wears glasses     Past Surgical History:  Procedure Laterality Date   CARPAL  TUNNEL RELEASE Right 07/1992   CERVICAL CONE BIOPSY  03/1985   CORONARY ANGIOPLASTY WITH STENT PLACEMENT  07/2009, 08/2009; 09/04/2013   "1+1+2; total of 4" (09/04/2013)   FRACTURE SURGERY N/A    Phreesia 05/17/2020   LEFT HEART CATHETERIZATION WITH CORONARY ANGIOGRAM N/A 09/04/2013   Procedure: LEFT HEART CATHETERIZATION WITH CORONARY ANGIOGRAM;  Surgeon: Candee Furbish, MD;  Location: [facility] CATH LAB;  Service: Cardiovascular;  Laterality: N/A;   MOHS SURGERY Left 05/2008   "nostril"   ORIF ANKLE FRACTURE Right 10/10/2018   Procedure: OPEN REDUCTION INTERNAL FIXATION (ORIF) ANKLE FRACTURE;  Surgeon: Rod Can, MD;  Location: [facility] ORS;  Service: Orthopedics;  Laterality: Right;   SHOULDER OPEN ROTATOR CUFF REPAIR Right 10/2004   "got 5 pins in" (09/04/2013)   TUBAL LIGATION Bilateral 1979     Current Outpatient Medications  Medication Sig Dispense Refill   amLODipine (NORVASC) 5 MG tablet Take 1 tablet (5 mg total) by mouth daily. 180 tablet 2   atorvastatin (LIPITOR) 80 MG tablet Take 1 tablet (80 mg total) by mouth daily at 6 PM. 90 tablet 2   diphenhydrAMINE (BENADRYL) 50 MG capsule Take 50 mg by mouth at bedtime.  ezetimibe (ZETIA) 10 MG tablet Take 1 tablet (10 mg total) by mouth daily. 90 tablet 2   glucose blood (ACCU-CHEK GUIDE) test strip Use as instructed 300 each 12   metoprolol succinate (TOPROL-XL) 25 MG 24 hr tablet TAKE 1 TABLET(25 MG) BY MOUTH DAILY 90 tablet 2   Multiple Vitamin (MULTIVITAMIN WITH MINERALS) TABS tablet Take 1 tablet by mouth daily.     nitroGLYCERIN (NITROSTAT) 0.4 MG SL tablet Place 1 tablet (0.4 mg total) under the tongue every 5 (five) minutes as needed for chest pain. 25 tablet 3   omeprazole (PRILOSEC) 20 MG capsule Take 20 mg by mouth every morning.      SitaGLIPtin-MetFORMIN HCl 50-1000 MG TB24 Take 2 tablets by mouth every morning. 180 tablet 0   ticagrelor (BRILINTA) 90 MG TABS tablet Take 1 tablet (90 mg total) by mouth 2 (two)  times daily. 180 tablet 2   valsartan (DIOVAN) 320 MG tablet Take 1 tablet (320 mg total) by mouth daily. 90 tablet 1   No current facility-administered medications for this visit.    Allergies:   Plavix [clopidogrel bisulfate] and Adhesive [tape]    Social History:  The patient  reports that she quit smoking about 14 years ago. Her smoking use included cigarettes. She has a 29.00 pack-year smoking history. She has never used smokeless tobacco. She reports that she does not drink alcohol and does not use drugs.   Family History:  The patient's *family history includes Cancer in her father; Diabetes in her daughter; Drug abuse in her brother and daughter; Heart disease in her daughter and paternal grandmother; Heart disease (age of onset: 33) in her mother; Hyperlipidemia in her brother and mother; Hypertension in her brother; Lupus in her daughter; Stroke in her paternal grandfather.    ROS:  Please see the history of present illness.   Otherwise, review of systems are positive for increased BP.   All other systems are reviewed and negative.    PHYSICAL EXAM: VS:  BP (!) 160/68    Pulse 71    Ht 5\' 2"  (1.575 m)    Wt 177 lb (80.3 kg)    SpO2 97%    BMI 32.37 kg/m  , BMI Body mass index is 32.37 kg/m. GEN: Well nourished, well developed, in no acute distress  HEENT: normal  Neck: no JVD, carotid bruits, or masses Cardiac: RRR; no murmurs, rubs, or gallops,no edema  Respiratory:  clear to auscultation bilaterally, normal work of breathing GI: soft, nontender, nondistended, + BS MS: no deformity or atrophy  Skin: warm and dry, no rash Neuro:  Strength and sensation are intact Psych: euthymic mood, full affect   EKG:   The ekg ordered today demonstrates NSR, RBBB   Recent Labs: 11/18/2019: BUN 12; Creatinine, Ser 0.95; Hemoglobin 13.5; Platelets 210; Potassium 3.4; Sodium 139 01/06/2020: ALT 41; TSH 5.140   Lipid Panel    Component Value Date/Time   CHOL 126 01/06/2020 0835    CHOL 127 10/28/2014 0801   TRIG 149 01/06/2020 0835   TRIG 192 (H) 10/28/2014 0801   HDL 39 (L) 01/06/2020 0835   HDL 30 (L) 10/28/2014 0801   CHOLHDL 3.2 01/06/2020 0835   CHOLHDL 3.8 08/08/2016 0731   VLDL 22 08/08/2016 0731   LDLCALC 61 01/06/2020 0835   LDLCALC 59 10/28/2014 0801     Other studies Reviewed: Additional studies/ records that were reviewed today with results demonstrating: labs reviewed .   ASSESSMENT AND PLAN:  1. CAD/Old  MI: Sx of mild angina.  Start Imdur 30 mg daily.  If no relief, will plan for cath.  She is in agreement with the plan.  Watch for sx and do not push activity past that point of angina. 2. DM: A1C 5.9 in 9/21.  Walking helps keep this down.  3. Hyperlipidemia: LDL 61, TG 149 4. Hypertensive heart disease:  Increase amlodipine to 5 mg BID given increased BP at home.  Hopefully this will also help chest pain.    Current medicines are reviewed at length with the patient today.  The patient concerns regarding her medicines were addressed.  The following changes have been made:  As above  Labs/ tests ordered today include:  No orders of the defined types were placed in this encounter.   Recommend 150 minutes/week of aerobic exercise Low fat, low carb, high fiber diet recommended  Disposition:   FU in 1 week f/u by phone   Signed, Larae Grooms, MD  08/26/2020 3:58 PM    [facility] [place], [place], [place]  [address] Phone: [phone]; Fax: [phone]

## 2020-08-26 ENCOUNTER — Ambulatory Visit: Payer: Medicare PPO | Admitting: Interventional Cardiology

## 2020-08-26 ENCOUNTER — Encounter: Payer: Self-pay | Admitting: Interventional Cardiology

## 2020-08-26 ENCOUNTER — Other Ambulatory Visit: Payer: Self-pay

## 2020-08-26 VITALS — BP 160/68 | HR 71 | Ht 62.0 in | Wt 177.0 lb

## 2020-08-26 DIAGNOSIS — I2510 Atherosclerotic heart disease of native coronary artery without angina pectoris: Secondary | ICD-10-CM

## 2020-08-26 DIAGNOSIS — I119 Hypertensive heart disease without heart failure: Secondary | ICD-10-CM

## 2020-08-26 DIAGNOSIS — E782 Mixed hyperlipidemia: Secondary | ICD-10-CM

## 2020-08-26 DIAGNOSIS — E1159 Type 2 diabetes mellitus with other circulatory complications: Secondary | ICD-10-CM

## 2020-08-26 MED ORDER — ISOSORBIDE MONONITRATE ER 30 MG PO TB24: 30.0000 mg | ORAL_TABLET | Freq: Every day | ORAL | 3 refills | Status: DC

## 2020-08-26 MED ORDER — AMLODIPINE BESYLATE 5 MG PO TABS
5.0000 mg | ORAL_TABLET | Freq: Two times a day (BID) | ORAL | 3 refills | Status: DC
Start: 2020-08-26 — End: 2021-06-29

## 2020-08-26 NOTE — Patient Instructions (Signed)
Medication Instructions:  Your physician has recommended you make the following change in your medication:   1. INCREASE: amlodipine (norvasc) 5 mg tablet: Take 1 tablet by mouth twice a day  2. START: isosorbide mononitrate (imdur) 30 mg tablet: Take 1 tablet by mouth once a day  *If you need a refill on your cardiac medications before your next appointment, please call your pharmacy*   Lab Work: None  If you have labs (blood work) drawn today and your tests are completely normal, you will receive your results only by: Marland Kitchen MyChart Message (if you have MyChart) OR . A paper copy in the mail If you have any lab test that is abnormal or we need to change your treatment, we will call you to review the results.   Testing/Procedures: None   Follow-Up: At [facility], you and your health needs are our priority.  As part of our continuing mission to provide you with exceptional heart care, we have created designated Provider Care Teams.  These Care Teams include your primary Cardiologist (physician) and Advanced Practice Providers (APPs -  Physician Assistants and Nurse Practitioners) who all work together to provide you with the care you need, when you need it.  We recommend signing up for the patient portal called "MyChart".  Sign up information is provided on this After Visit Summary.  MyChart is used to connect with patients for Virtual Visits (Telemedicine).  Patients are able to view lab/test results, encounter notes, upcoming appointments, etc.  Non-urgent messages can be sent to your provider as well.   To learn more about what you can do with MyChart, go to [email or web address].    Your next appointment:   1 week(s)  The format for your next appointment:   Virtual Visit   Provider:   Casandra Doffing, MD   Other Instructions None

## 2020-09-01 ENCOUNTER — Encounter: Payer: Self-pay | Admitting: Registered Nurse

## 2020-09-01 NOTE — Progress Notes (Signed)
Established Patient Office Visit  Subjective:  Patient ID: Suzanne Watkins, female    DOB: February 10, 1951  Age: 69 y.o. MRN: [id number]  CC:  Chief Complaint  Patient presents with  . Transitions Of Care  . Diabetes    want A1c Checked    HPI Suzanne Watkins presents for visit to est care.  Former patient of Dr. Brigitte Pulse  Histories reviewed with patient, updated as warranted  T2dm: taking sitagliptin-metformin 50-1000 ER PO bid. Good effect. Denies AEs. Denies symptoms of hyper or hypoglycemia. Hoping to continue this medication. Largest concern today is ensuring ongoing control of her t2dm  Hx of MI: taking ticagrelor 90mg  PO bid, atorvastatin 80mg  PO qd, amlodipine 5mg  PO qd, valsartan 320 mg PO qd, imdur 30mg  ER PO qd, ezetimibe 10mg  PO qd, metoprolol 25 mg ER PO qd, and nitroglycerin 0.4mg  sublingual q 47m PRN. Good effect. Feeling well. Followed by cardiology Dr. Irish Lack. Compliant with follow up. No ongoing symptoms. Reports that she has made lifestyle changes to avoid ongoing concern and to lower her risk for repeat MI.   Hx of COVID vaccination: second shot given on 12/13/19. Interested to know if she is still immune. Interested in booster when she is eligible. No sick contacts to her knowledge.   In general feeling well today and without acute concern.   Past Medical History:  Diagnosis Date  . Arthritis    "probably in my hands" (09/04/2013)  . 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 Cancer ([place])    Phreesia 05/17/2020  . Cervical cancer ([place]) 1986  . Coronary atherosclerosis of native coronary artery 09/04/2013   RCA and obtuse marginal stent/DES-2010  . Diabetes mellitus without complication ([place])    Phreesia 05/17/2020  . Fibromyalgia    "dx'd in 1994"  . Full dentures   . GERD (gastroesophageal reflux disease)   . Hyperlipidemia   . Hypertension   . Myocardial infarction ([place])    Phreesia 05/17/2020  . Obesity   . Shortness of breath    "just related to angina I  was having recently" (09/04/2013)  . Type II diabetes mellitus ([place])    followed by pcp  . Wears glasses     Past Surgical History:  Procedure Laterality Date  . CARPAL TUNNEL RELEASE Right 07/1992  . CERVICAL CONE BIOPSY  03/1985  . CORONARY ANGIOPLASTY WITH STENT PLACEMENT  07/2009, 08/2009; 09/04/2013   "1+1+2; total of 4" (09/04/2013)  . FRACTURE SURGERY N/A    Phreesia 05/17/2020  . LEFT HEART CATHETERIZATION WITH CORONARY ANGIOGRAM N/A 09/04/2013   Procedure: LEFT HEART CATHETERIZATION WITH CORONARY ANGIOGRAM;  Surgeon: Candee Furbish, MD;  Location: [facility] CATH LAB;  Service: Cardiovascular;  Laterality: N/A;  . MOHS SURGERY Left 05/2008   "nostril"  . ORIF ANKLE FRACTURE Right 10/10/2018   Procedure: OPEN REDUCTION INTERNAL FIXATION (ORIF) ANKLE FRACTURE;  Surgeon: Rod Can, MD;  Location: [facility] ORS;  Service: Orthopedics;  Laterality: Right;  . SHOULDER OPEN ROTATOR CUFF REPAIR Right 10/2004   "got 5 pins in" (09/04/2013)  . TUBAL LIGATION Bilateral 1979    Family History  Problem Relation Age of Onset  . Heart disease Mother 59       AMI; cause of death  . Hyperlipidemia Mother   . Cancer Father        Throat  . Hyperlipidemia Brother   . Hypertension Brother   . Drug abuse Brother   . Diabetes Daughter   . Drug abuse Daughter   .  Heart disease Daughter   . Lupus Daughter   . Heart disease Paternal Grandmother   . Stroke Paternal Grandfather     Social History   Socioeconomic History  . Marital status: Married    Spouse name: Not on file  . Number of children: Not on file  . Years of education: Not on file  . Highest education level: Not on file  Occupational History  . Not on file  Tobacco Use  . Smoking status: Former Smoker    Packs/day: 1.00    Years: 29.00    Pack years: 29.00    Types: Cigarettes    Quit date: 06/14/2006    Years since quitting: 14.2  . Smokeless tobacco: Never Used  Vaping Use  . Vaping Use: Never used  Substance and Sexual Activity   . Alcohol use: No  . Drug use: No  . Sexual activity: Yes  Other Topics Concern  . Not on file  Social History Narrative   Marital status: married      Children: grown children; 9 grandchildren; 1 gg      Lives: with husband, 2 birds/[employer]      Employment:  [employer] x 18 years; billing      Tobacco: quit smoking 05/2006      Alcohol:  None     Exercise: walking 30 minutes per day   Social Determinants of Health   Financial Resource Strain:   . Difficulty of Paying Living Expenses: Not on file  Food Insecurity:   . Worried About Charity fundraiser in the Last Year: Not on file  . Ran Out of Food in the Last Year: Not on file  Transportation Needs:   . Lack of Transportation (Medical): Not on file  . Lack of Transportation (Non-Medical): Not on file  Physical Activity:   . Days of Exercise per Week: Not on file  . Minutes of Exercise per Session: Not on file  Stress:   . Feeling of Stress : Not on file  Social Connections:   . Frequency of Communication with Friends and Family: Not on file  . Frequency of Social Gatherings with Friends and Family: Not on file  . Attends Religious Services: Not on file  . Active Member of Clubs or Organizations: Not on file  . Attends Archivist Meetings: Not on file  . Marital Status: Not on file  Intimate Partner Violence:   . Fear of Current or Ex-Partner: Not on file  . Emotionally Abused: Not on file  . Physically Abused: Not on file  . Sexually Abused: Not on file    Outpatient Medications Prior to Visit  Medication Sig Dispense Refill  . atorvastatin (LIPITOR) 80 MG tablet Take 1 tablet (80 mg total) by mouth daily at 6 PM. 90 tablet 2  . diphenhydrAMINE (BENADRYL) 50 MG capsule Take 50 mg by mouth at bedtime.     Marland Kitchen ezetimibe (ZETIA) 10 MG tablet Take 1 tablet (10 mg total) by mouth daily. 90 tablet 2  . metoprolol succinate (TOPROL-XL) 25 MG 24 hr tablet TAKE 1 TABLET(25 MG) BY MOUTH DAILY 90 tablet 2  .  Multiple Vitamin (MULTIVITAMIN WITH MINERALS) TABS tablet Take 1 tablet by mouth daily.    . nitroGLYCERIN (NITROSTAT) 0.4 MG SL tablet Place 1 tablet (0.4 mg total) under the tongue every 5 (five) minutes as needed for chest pain. 25 tablet 3  . omeprazole (PRILOSEC) 20 MG capsule Take 20 mg by mouth every  morning.     . ticagrelor (BRILINTA) 90 MG TABS tablet Take 1 tablet (90 mg total) by mouth 2 (two) times daily. 180 tablet 2  . valsartan (DIOVAN) 320 MG tablet Take 1 tablet (320 mg total) by mouth daily. 90 tablet 1  . amLODipine (NORVASC) 5 MG tablet Take 1 tablet (5 mg total) by mouth daily. 180 tablet 2  . SitaGLIPtin-MetFORMIN HCl 50-1000 MG TB24 Take 2 tablets by mouth every morning. 180 tablet .  Marland Kitchen Calcium Acetate, Phos Binder, (CALCIUM ACETATE PO) Take 2 tablets by mouth 2 (two) times daily.    Marland Kitchen glucose blood test strip Use as instructed. One touch ultra meter. 100 each 11  . ONE TOUCH ULTRA TEST test strip USE AS DIRECTED 100 each 0  . OVER THE COUNTER MEDICATION Take 680 mg by mouth at bedtime. Elemental Strontium     No facility-administered medications prior to visit.    Allergies  Allergen Reactions  . Plavix [Clopidogrel Bisulfate] Other (See Comments)    Stomach upset  . Adhesive [Tape] Rash    ROS Review of Systems  Constitutional: Negative.   HENT: Negative.   Eyes: Negative.   Respiratory: Negative.   Cardiovascular: Negative.   Gastrointestinal: Negative.   Genitourinary: Negative.   Musculoskeletal: Negative.   Skin: Negative.   Neurological: Negative.   Psychiatric/Behavioral: Negative.       Objective:    Physical Exam Vitals and nursing note reviewed.  Constitutional:      General: She is not in acute distress.    Appearance: Normal appearance. She is normal weight. She is not ill-appearing, toxic-appearing or diaphoretic.  Cardiovascular:     Rate and Rhythm: Normal rate and regular rhythm.     Heart sounds: Normal heart sounds. No murmur  heard.  No friction rub. No gallop.   Pulmonary:     Effort: Pulmonary effort is normal. No respiratory distress.     Breath sounds: Normal breath sounds. No stridor. No wheezing, rhonchi or rales.  Chest:     Chest wall: No tenderness.  Skin:    General: Skin is warm and dry.  Neurological:     General: No focal deficit present.     Mental Status: She is alert and oriented to person, place, and time. Mental status is at baseline.  Psychiatric:        Mood and Affect: Mood normal.        Behavior: Behavior normal.        Thought Content: Thought content normal.        Judgment: Judgment normal.     BP (!) 160/80   Pulse 89   Temp 97.9 F (36.6 C) (Temporal)   Ht 5\' 2"  (1.575 m)   Wt 174 lb 6.4 oz (79.1 kg)   SpO2 98%   BMI 31.90 kg/m  Wt Readings from Last 3 Encounters:  08/26/20 177 lb (80.3 kg)  07/03/20 174 lb 6.4 oz (79.1 kg)  01/03/20 182 lb 3.2 oz (82.6 kg)     Health Maintenance Due  Topic Date Due  . TETANUS/TDAP  Never done  . COLONOSCOPY  04/05/2017  . OPHTHALMOLOGY EXAM  05/29/2019  . PNA vac Low Risk Adult (2 of 2 - PPSV23) 08/19/2020    There are no preventive care reminders to display for this patient.  Lab Results  Component Value Date   TSH 5.140 (H) 01/06/2020   Lab Results  Component Value Date   WBC 10.6 (H) 11/18/2019  HGB 13.5 11/18/2019   HCT 39.5 11/18/2019   MCV 92.5 11/18/2019   PLT 210 11/18/2019   Lab Results  Component Value Date   NA 139 11/18/2019   K 3.4 (L) 11/18/2019   CO2 18 (L) 11/18/2019   GLUCOSE 268 (H) 11/18/2019   BUN 12 11/18/2019   CREATININE 0.95 11/18/2019   BILITOT 0.3 01/06/2020   ALKPHOS 89 01/06/2020   AST 32 01/06/2020   ALT 41 (H) 01/06/2020   PROT 6.5 01/06/2020   ALBUMIN 4.4 01/06/2020   CALCIUM 9.1 11/18/2019   ANIONGAP 14 11/18/2019   Lab Results  Component Value Date   CHOL 126 01/06/2020   Lab Results  Component Value Date   HDL 39 (L) 01/06/2020   Lab Results  Component Value  Date   LDLCALC 61 01/06/2020   Lab Results  Component Value Date   TRIG 149 01/06/2020   Lab Results  Component Value Date   CHOLHDL 3.2 01/06/2020   Lab Results  Component Value Date   HGBA1C 5.9 (A) 07/03/2020      Assessment & Plan:   Problem List Items Addressed This Visit      Endocrine   Diabetes ([place]) - Primary   Relevant Medications   SitaGLIPtin-MetFORMIN HCl 50-1000 MG TB24   Other Relevant Orders   POCT glycosylated hemoglobin (Hb A1C) (Completed)    Other Visit Diagnoses    COVID-19 virus test result unknown       Relevant Orders   SAR CoV2 Serology (COVID 19)AB(IGG)IA (Completed)      Meds ordered this encounter  Medications  . SitaGLIPtin-MetFORMIN HCl 50-1000 MG TB24    Sig: Take 2 tablets by mouth every morning.    Dispense:  180 tablet    Refill:  0    Order Specific Question:   Supervising Provider    Answer:   Carlota Raspberry, JEFFREY R [[id number]]    Follow-up: No follow-ups on file.   PLAN  a1c showing 5.9 - well controlled. Continue current medication and lifestyle changes as they are proving to be effective.  covid serology sent to ensure some level of immunity. Discussed with patient the limits of qualitative testing. Encouraged booster when eligible  Continue to follow up with Dr. Irish Lack to keep risk for MI low.   Return in 3 mo for labs  Patient encouraged to call clinic with any questions, comments, or concerns.  I spent 42 minutes with this patient, more than 50% of which was spent counseling and/or educating.  Maximiano Coss, NP

## 2020-09-01 NOTE — Progress Notes (Signed)
Virtual Visit via Video Note   This visit type was conducted due to national recommendations for restrictions regarding the COVID-19 Pandemic (e.g. social distancing) in an effort to limit this patient's exposure and mitigate transmission in our community.  Due to her co-morbid illnesses, this patient is at least at moderate risk for complications without adequate follow up.  This format is felt to be most appropriate for this patient at this time.  All issues noted in this document were discussed and addressed.  A limited physical exam was performed with this format.  Please refer to the patient's chart for her consent to telehealth for [facility].       Date:  09/01/2020   ID:  Suzanne Watkins, DOB Jan 08, 1951, MRN [id number] The patient was identified using 2 identifiers.  Patient Location: Home Provider Location: Office/Clinic  PCP:  Maximiano Coss, NP  Cardiologist:  Larae Grooms, MD  Electrophysiologist:  None   Evaluation Performed:  Follow-Up Visit  Chief Complaint:  CAD  History of Present Illness:    Suzanne Watkins is a 69 y.o. female with Who has had CAD. She has had stents in all three coronary vessels. Most recent were in 11/14.  Sheused towalk a lot on the [employer] She retired in Jan 2019, so she joined a senior center to exercise on the treadmill.  She had a broken leg in 11/19.She had surgery in December 12/19. She did PT. No cardiac issues at that time.  She had some SHOB in Jan 2021. She has some intermittent tachycardia, when she checks her pulse ox. CT scan: done showed no PE, mild emphysema.  In 10/21, she had an episode of left arm pain with SHOB.  She used a SL NTG and had some relief.  She had one episode of chest tightness again and used SL NTG.    BP has been higher at home of late.  140s at times.  Imdur was added and amlodipine was increased to 5 mg BID.   The patient does not have symptoms concerning for COVID-19  infection (fever, chills, cough, or new shortness of breath).   Since the last visit, her BP has been better, 962-229 systolic with extra amlodipine and Imdur.  She has been walking regularly and no sx like what she had before her stents.    No left arm pain.  Still has some DOE with walking up hill but does not have to stop.    She feels that she is back to normal.      Past Medical History:  Diagnosis Date  . Arthritis    "probably in my hands" (09/04/2013)  . 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 Cancer ([place])    Phreesia 05/17/2020  . Cervical cancer ([place]) 1986  . Coronary atherosclerosis of native coronary artery 09/04/2013   RCA and obtuse marginal stent/DES-2010  . Diabetes mellitus without complication ([place])    Phreesia 05/17/2020  . Fibromyalgia    "dx'd in 1994"  . Full dentures   . GERD (gastroesophageal reflux disease)   . Hyperlipidemia   . Hypertension   . Myocardial infarction ([place])    Phreesia 05/17/2020  . Obesity   . Shortness of breath    "just related to angina I was having recently" (09/04/2013)  . Type II diabetes mellitus ([place])    followed by pcp  . Wears glasses    Past Surgical History:  Procedure Laterality Date  . CARPAL TUNNEL RELEASE Right 07/1992  .  CERVICAL CONE BIOPSY  03/1985  . CORONARY ANGIOPLASTY WITH STENT PLACEMENT  07/2009, 08/2009; 09/04/2013   "1+1+2; total of 4" (09/04/2013)  . FRACTURE SURGERY N/A    Phreesia 05/17/2020  . LEFT HEART CATHETERIZATION WITH CORONARY ANGIOGRAM N/A 09/04/2013   Procedure: LEFT HEART CATHETERIZATION WITH CORONARY ANGIOGRAM;  Surgeon: Candee Furbish, MD;  Location: [facility] CATH LAB;  Service: Cardiovascular;  Laterality: N/A;  . MOHS SURGERY Left 05/2008   "nostril"  . ORIF ANKLE FRACTURE Right 10/10/2018   Procedure: OPEN REDUCTION INTERNAL FIXATION (ORIF) ANKLE FRACTURE;  Surgeon: Rod Can, MD;  Location: [facility] ORS;  Service: Orthopedics;  Laterality: Right;  . SHOULDER OPEN ROTATOR CUFF REPAIR Right  10/2004   "got 5 pins in" (09/04/2013)  . TUBAL LIGATION Bilateral 1979     No outpatient medications have been marked as taking for the 09/02/20 encounter (Appointment) with Jettie Booze, MD.     Allergies:   Plavix [clopidogrel bisulfate] and Adhesive [tape]   Social History   Tobacco Use  . Smoking status: Former Smoker    Packs/day: 1.00    Years: 29.00    Pack years: 29.00    Types: Cigarettes    Quit date: 06/14/2006    Years since quitting: 14.2  . Smokeless tobacco: Never Used  Vaping Use  . Vaping Use: Never used  Substance Use Topics  . Alcohol use: No  . Drug use: No     Family Hx: The patient's family history includes Cancer in her father; Diabetes in her daughter; Drug abuse in her brother and daughter; Heart disease in her daughter and paternal grandmother; Heart disease (age of onset: 85) in her mother; Hyperlipidemia in her brother and mother; Hypertension in her brother; Lupus in her daughter; Stroke in her paternal grandfather.  ROS:   Please see the history of present illness.     All other systems reviewed and are negative.   Prior CV studies:   The following studies were reviewed today:  Prior cath results reviewed  Labs/Other Tests and Data Reviewed:    EKG:  No ECG reviewed.  Recent Labs: 11/18/2019: BUN 12; Creatinine, Ser 0.95; Hemoglobin 13.5; Platelets 210; Potassium 3.4; Sodium 139 01/06/2020: ALT 41; TSH 5.140   Recent Lipid Panel Lab Results  Component Value Date/Time   CHOL 126 01/06/2020 08:35 AM   CHOL 127 10/28/2014 08:01 AM   TRIG 149 01/06/2020 08:35 AM   TRIG 192 (H) 10/28/2014 08:01 AM   HDL 39 (L) 01/06/2020 08:35 AM   HDL 30 (L) 10/28/2014 08:01 AM   CHOLHDL 3.2 01/06/2020 08:35 AM   CHOLHDL 3.8 08/08/2016 07:31 AM   LDLCALC 61 01/06/2020 08:35 AM   LDLCALC 59 10/28/2014 08:01 AM    Wt Readings from Last 3 Encounters:  08/26/20 177 lb (80.3 kg)  07/03/20 174 lb 6.4 oz (79.1 kg)  01/03/20 182 lb 3.2 oz (82.6  kg)     Risk Assessment/Calculations:      Objective:    Vital Signs:  There were no vitals taken for this visit.   VITAL SIGNS:  reviewed GEN:  no acute distress RESPIRATORY:  normal respiratory effort, symmetric expansion PSYCH:  normal affect limited exam by phone format  ASSESSMENT & PLAN:    1. CAD/Old MI:  Angina has resolved for the most part on medical therapy.  No use of SL NTG. COntinue current meds.  If sx worsen, let us know.   She currently feels well and feels she does not need  a cath.  2. DM: The current medical regimen is effective;  continue present plan and medications. 3. Hyperlipidemia: The current medical regimen is effective;  continue present plan and medications. 4. Hypertensive heart disease: The current medical regimen is effective;  continue present plan and medications.   COVID-19 Education: The signs and symptoms of COVID-19 were discussed with the patient and how to seek care for testing (follow up with PCP or arrange E-visit).  The importance of social distancing was discussed today.  Time:   Today, I have spent 12 minutes with the patient with telehealth technology discussing the above problems.     Medication Adjustments/Labs and Tests Ordered: Current medicines are reviewed at length with the patient today.  Concerns regarding medicines are outlined above.   Tests Ordered: No orders of the defined types were placed in this encounter.   Medication Changes: No orders of the defined types were placed in this encounter.   Follow Up:  In Person in 4 month(s)  Signed, Larae Grooms, MD  09/01/2020 5:38 PM    [place]

## 2020-09-02 ENCOUNTER — Telehealth (INDEPENDENT_AMBULATORY_CARE_PROVIDER_SITE_OTHER): Payer: Medicare PPO | Admitting: Interventional Cardiology

## 2020-09-02 ENCOUNTER — Encounter: Payer: Self-pay | Admitting: Interventional Cardiology

## 2020-09-02 ENCOUNTER — Telehealth: Payer: Self-pay

## 2020-09-02 VITALS — BP 107/64 | HR 85 | Ht 62.0 in

## 2020-09-02 DIAGNOSIS — I2510 Atherosclerotic heart disease of native coronary artery without angina pectoris: Secondary | ICD-10-CM | POA: Diagnosis not present

## 2020-09-02 DIAGNOSIS — E782 Mixed hyperlipidemia: Secondary | ICD-10-CM | POA: Diagnosis not present

## 2020-09-02 DIAGNOSIS — E1159 Type 2 diabetes mellitus with other circulatory complications: Secondary | ICD-10-CM | POA: Diagnosis not present

## 2020-09-02 DIAGNOSIS — I119 Hypertensive heart disease without heart failure: Secondary | ICD-10-CM | POA: Diagnosis not present

## 2020-09-02 DIAGNOSIS — R002 Palpitations: Secondary | ICD-10-CM

## 2020-09-02 MED ORDER — NITROGLYCERIN 0.4 MG SL SUBL: 0.4000 mg | SUBLINGUAL_TABLET | SUBLINGUAL | 3 refills | Status: DC | PRN

## 2020-09-02 NOTE — Patient Instructions (Signed)
Medication Instructions:  Your physician recommends that you continue on your current medications as directed. Please refer to the Current Medication list given to you today.  *If you need a refill on your cardiac medications before your next appointment, please call your pharmacy*   Lab Work: None If you have labs (blood work) drawn today and your tests are completely normal, you will receive your results only by: Marland Kitchen MyChart Message (if you have MyChart) OR . A paper copy in the mail If you have any lab test that is abnormal or we need to change your treatment, we will call you to review the results.   Follow-Up: At [facility], you and your health needs are our priority.  As part of our continuing mission to provide you with exceptional heart care, we have created designated Provider Care Teams.  These Care Teams include your primary Cardiologist (physician) and Advanced Practice Providers (APPs -  Physician Assistants and Nurse Practitioners) who all work together to provide you with the care you need, when you need it.  We recommend signing up for the patient portal called "MyChart".  Sign up information is provided on this After Visit Summary.  MyChart is used to connect with patients for Virtual Visits (Telemedicine).  Patients are able to view lab/test results, encounter notes, upcoming appointments, etc.  Non-urgent messages can be sent to your provider as well.   To learn more about what you can do with MyChart, go to [email or web address].    Your next appointment:   4 month(s)  The format for your next appointment:   In Person  Provider:   You may see Larae Grooms, MD or one of the following Advanced Practice Providers on your designated Care Team:    Melina Copa, PA-C  Ermalinda Barrios, PA-C

## 2020-09-02 NOTE — Telephone Encounter (Signed)
  Patient Consent for Virtual Visit         Suzanne Watkins has provided verbal consent on 09/02/2020 for a virtual visit (video or telephone).   CONSENT FOR VIRTUAL VISIT FOR:  Suzanne Watkins  By participating in this virtual visit I agree to the following:  I hereby voluntarily request, consent and authorize [facility] and its employed or contracted physicians, physician assistants, nurse practitioners or other licensed health care professionals (the Practitioner), to provide me with telemedicine health care services (the "Services") as deemed necessary by the treating Practitioner. I acknowledge and consent to receive the Services by the Practitioner via telemedicine. I understand that the telemedicine visit will involve communicating with the Practitioner through live audiovisual communication technology and the disclosure of certain medical information by electronic transmission. I acknowledge that I have been given the opportunity to request an in-person assessment or other available alternative prior to the telemedicine visit and am voluntarily participating in the telemedicine visit.  I understand that I have the right to withhold or withdraw my consent to the use of telemedicine in the course of my care at any time, without affecting my right to future care or treatment, and that the Practitioner or I may terminate the telemedicine visit at any time. I understand that I have the right to inspect all information obtained and/or recorded in the course of the telemedicine visit and may receive copies of available information for a reasonable fee.  I understand that some of the potential risks of receiving the Services via telemedicine include:  Marland Kitchen Delay or interruption in medical evaluation due to technological equipment failure or disruption; . Information transmitted may not be sufficient (e.g. poor resolution of images) to allow for appropriate medical decision making by the Practitioner;  and/or  . In rare instances, security protocols could fail, causing a breach of personal health information.  Furthermore, I acknowledge that it is my responsibility to provide information about my medical history, conditions and care that is complete and accurate to the best of my ability. I acknowledge that Practitioner's advice, recommendations, and/or decision may be based on factors not within their control, such as incomplete or inaccurate data provided by me or distortions of diagnostic images or specimens that may result from electronic transmissions. I understand that the practice of medicine is not an exact science and that Practitioner makes no warranties or guarantees regarding treatment outcomes. I acknowledge that a copy of this consent can be made available to me via my patient portal ([facility]), or I can request a printed copy by calling the office of [facility].    I understand that my insurance will be billed for this visit.   I have read or had this consent read to me. . I understand the contents of this consent, which adequately explains the benefits and risks of the Services being provided via telemedicine.  . I have been provided ample opportunity to ask questions regarding this consent and the Services and have had my questions answered to my satisfaction. . I give my informed consent for the services to be provided through the use of telemedicine in my medical care

## 2020-09-22 ENCOUNTER — Other Ambulatory Visit: Payer: Self-pay | Admitting: Interventional Cardiology

## 2020-09-27 DIAGNOSIS — I209 Angina pectoris, unspecified: Secondary | ICD-10-CM

## 2020-09-27 DIAGNOSIS — Z01812 Encounter for preprocedural laboratory examination: Secondary | ICD-10-CM

## 2020-10-01 MED ORDER — ISOSORBIDE MONONITRATE ER 60 MG PO TB24: 60.0000 mg | ORAL_TABLET | Freq: Every day | ORAL | 3 refills | Status: DC

## 2020-10-01 NOTE — Telephone Encounter (Signed)
Called and spoke to patient and made her aware of recommendations below. Increased imdur to 60 mg QD. Rx sent to preferred pharmacy. Cath has been scheduled for 12/7. Patient instructed to avoid long walks until after her cath. She will come for NV EKG and labs tomorrow 12/3. She will have her covid test done 12/4 and post cath f/u appt on 12/22. Reviewed cath instructions below. She verbalized understanding and is in agreement with this plan.     [facility] [place], [place] [address] Dept: [phone] Loc: [phone]  Suzanne Watkins  10/01/2020  You are scheduled for a Cardiac Catheterization on Tuesday, December 7 with Dr. Larae Grooms.  1. Please arrive at the [facility] (Main Entrance A) at [facility]: [address] [place], [place] [address] at 10:00 AM (This time is two hours before your procedure to ensure your preparation). Free valet parking service is available.   Special note: Every effort is made to have your procedure done on time. Please understand that emergencies sometimes delay scheduled procedures.  2. Diet: Do not eat solid foods after midnight.  The patient may have clear liquids until 5am upon the day of the procedure.  3. Labs: You will need to have and EKG and blood drawn on Friday, December 3 at 2:00 PM at [facility] at [facility]. [address]   Due to recent COVID-19 restrictions implemented by our local and state authorities and in an effort to keep both patients and staff as safe as possible, our hospital system requires COVID-19 testing prior to certain scheduled hospital procedures.  Please go to [place]. [place], [place] [address] on 10/03/20 at 11:40 AM.  This is a drive up testing site.  You will not need to exit your vehicle.  You will not be billed at the time of testing but may receive a bill later  depending on your insurance. You must agree to self-quarantine from the time of your testing until the procedure date on 10/06/20.  This should included staying home with ONLY the people you live with.  Avoid take-out, grocery store shopping or leaving the house for any non-emergent reason.  Failure to have your COVID-19 test done on the date and time you have been scheduled will result in cancellation of your procedure.  Please call our office at [phone] if you have any questions.   4. Medication instructions in preparation for your procedure:   Contrast Allergy: No  DO NOT take sitagliptin-metformin (Janumet) the day of your procedure and for 48 hours after your procedure  On the morning of your procedure, take 81 mg of Aspirin and your Brilinta and any morning medicines NOT listed above.  You may use sips of water.  5. Plan for one night stay--bring personal belongings. 6. Bring a current list of your medications and current insurance cards. 7. You MUST have a responsible person to drive you home. 8. Someone MUST be with you the first 24 hours after you arrive home or your discharge will be delayed. 9. Please wear clothes that are easy to get on and off and wear slip-on shoes.  You are scheduled for a follow up appointment with Dr. Irish Lack on 10/21/20 at 2:40 PM at [facility].

## 2020-10-02 ENCOUNTER — Other Ambulatory Visit: Payer: Self-pay

## 2020-10-02 ENCOUNTER — Ambulatory Visit (INDEPENDENT_AMBULATORY_CARE_PROVIDER_SITE_OTHER): Payer: Medicare PPO | Admitting: *Deleted

## 2020-10-02 ENCOUNTER — Other Ambulatory Visit: Payer: Medicare PPO | Admitting: *Deleted

## 2020-10-02 VITALS — HR 87 | Ht 62.0 in | Wt 178.2 lb

## 2020-10-02 DIAGNOSIS — Z01812 Encounter for preprocedural laboratory examination: Secondary | ICD-10-CM

## 2020-10-02 DIAGNOSIS — I209 Angina pectoris, unspecified: Secondary | ICD-10-CM

## 2020-10-02 NOTE — Patient Instructions (Addendum)
Medication Instructions:  Your physician recommends that you continue on your current medications as directed. Please refer to the Current Medication list given to you today.  *If you need a refill on your cardiac medications before your next appointment, please call your pharmacy*  Lab Work: None ordered.  If you have labs (blood work) drawn today and your tests are completely normal, you will receive your results only by: Marland Kitchen MyChart Message (if you have MyChart) OR . A paper copy in the mail If you have any lab test that is abnormal or we need to change your treatment, we will call you to review the results.  Testing/Procedures: None ordered.  Follow-Up: At [facility], you and your health needs are our priority.  As part of our continuing mission to provide you with exceptional heart care, we have created designated Provider Care Teams.  These Care Teams include your primary Cardiologist (physician) and Advanced Practice Providers (APPs -  Physician Assistants and Nurse Practitioners) who all work together to provide you with the care you need, when you need it.  We recommend signing up for the patient portal called "MyChart".  Sign up information is provided on this After Visit Summary.  MyChart is used to connect with patients for Virtual Visits (Telemedicine).  Patients are able to view lab/test results, encounter notes, upcoming appointments, etc.  Non-urgent messages can be sent to your provider as well.   To learn more about what you can do with MyChart, go to [email or web address].        Other Instructions:  Continue as planned for Heart cath on the 12/7 with Dr. Irish Lack at 10 am.

## 2020-10-02 NOTE — Progress Notes (Signed)
1.) Reason for visit: Pre Heart Cath EKG  2.) Name of MD requesting visit: Dr Irish Lack  H&P: On 12/2 Increased Imdur to 60 mg daily scheduled cath, nurse visit EKG, labs and covid testing along with follow up for increased use of nitroglycerin during walks, breathlessness, tightness in chest.   3.) ROS related to problem: Patient is feeling okay today. No chest pain at this time. Advised to avoid her long walks until after her cath.   4.) Assessment and plan per MD: DOD Dr. Caryl Comes reviewed EKG in office. No change at this time and continue as planned with Dr. Irish Lack.  Patient verbalized understanding.

## 2020-10-03 ENCOUNTER — Other Ambulatory Visit (HOSPITAL_COMMUNITY)
Admission: RE | Admit: 2020-10-03 | Discharge: 2020-10-03 | Disposition: A | Discharge status: Home / Self Care | Payer: Medicare PPO | Source: Ambulatory Visit | Attending: Interventional Cardiology | Admitting: Interventional Cardiology

## 2020-10-03 DIAGNOSIS — Z01812 Encounter for preprocedural laboratory examination: Secondary | ICD-10-CM | POA: Insufficient documentation

## 2020-10-03 DIAGNOSIS — Z20822 Contact with and (suspected) exposure to covid-19: Secondary | ICD-10-CM | POA: Insufficient documentation

## 2020-10-03 LAB — BASIC METABOLIC PANEL
BUN/Creatinine Ratio: 21 (ref 12–28)
BUN: 20 mg/dL (ref 8–27)
CO2: 19 mmol/L — ABNORMAL LOW (ref 20–29)
Calcium: 9.7 mg/dL (ref 8.7–10.3)
Chloride: 102 mmol/L (ref 96–106)
Creatinine, Ser: 0.96 mg/dL (ref 0.57–1.00)
GFR calc Af Amer: 71 mL/min/{1.73_m2} (ref >59)
GFR calc non Af Amer: 61 mL/min/{1.73_m2} (ref >59)
Glucose: 89 mg/dL (ref 65–99)
Potassium: 5.1 mmol/L (ref 3.5–5.2)
Sodium: 141 mmol/L (ref 134–144)

## 2020-10-03 LAB — CBC
Hematocrit: 35 % (ref 34.0–46.6)
Hemoglobin: 12.2 g/dL (ref 11.1–15.9)
MCH: 32 pg (ref 26.6–33.0)
MCHC: 34.9 g/dL (ref 31.5–35.7)
MCV: 92 fL (ref 79–97)
Platelets: 258 10*3/uL (ref 150–450)
RBC: 3.81 x10E6/uL (ref 3.77–5.28)
RDW: 13 % (ref 11.7–15.4)
WBC: 9.2 10*3/uL (ref 3.4–10.8)

## 2020-10-03 LAB — SARS CORONAVIRUS 2 (TAT 6-24 HRS): SARS Coronavirus 2: NEGATIVE

## 2020-10-05 ENCOUNTER — Telehealth: Payer: Self-pay | Admitting: *Deleted

## 2020-10-05 NOTE — Telephone Encounter (Signed)
Pt contacted pre-catheterization scheduled at [facility] for: Tuesday October 06, 2020 12 Noon Verified arrival time and place: [facility]) at: 10 AM   No solid food after midnight prior to cath, clear liquids until 5 AM day of procedure.  Hold: Janumet -day of procedure and 48 hours post procedure   Except hold medications AM meds can be  taken pre-cath with sips of water including: ASA 81 mg Brilinta 90 mg  Confirmed patient has responsible adult to drive home post procedure and be with patient first 24 hours after arriving home: yes  You are allowed ONE visitor in the waiting room during the time you are at the hospital for your procedure. Both you and your visitor must wear a mask once you enter the hospital.       COVID-19 Pre-Screening Questions:  . In the past 14 days have you had any symptoms concerning for COVID-19 infection (fever, chills, cough, or new shortness of breath)? no . In the past 14 days have you been around anyone with known Covid 19? no   Reviewed procedure/mask/visitor instructions, COVID-19 questions with patient.

## 2020-10-06 ENCOUNTER — Ambulatory Visit (HOSPITAL_COMMUNITY)
Admission: RE | Admit: 2020-10-06 | Discharge: 2020-10-07 | Disposition: A | Discharge status: Home / Self Care | Payer: Medicare PPO | Source: Ambulatory Visit | Attending: Interventional Cardiology | Admitting: Interventional Cardiology

## 2020-10-06 ENCOUNTER — Encounter (HOSPITAL_COMMUNITY)
Admission: RE | Disposition: A | Discharge status: Home / Self Care | Payer: Self-pay | Source: Ambulatory Visit | Attending: Interventional Cardiology

## 2020-10-06 ENCOUNTER — Other Ambulatory Visit: Payer: Self-pay

## 2020-10-06 DIAGNOSIS — E1169 Type 2 diabetes mellitus with other specified complication: Secondary | ICD-10-CM | POA: Diagnosis present

## 2020-10-06 DIAGNOSIS — Z7984 Long term (current) use of oral hypoglycemic drugs: Secondary | ICD-10-CM | POA: Insufficient documentation

## 2020-10-06 DIAGNOSIS — Z955 Presence of coronary angioplasty implant and graft: Secondary | ICD-10-CM | POA: Insufficient documentation

## 2020-10-06 DIAGNOSIS — I209 Angina pectoris, unspecified: Secondary | ICD-10-CM | POA: Diagnosis present

## 2020-10-06 DIAGNOSIS — Z79899 Other long term (current) drug therapy: Secondary | ICD-10-CM | POA: Insufficient documentation

## 2020-10-06 DIAGNOSIS — I25119 Atherosclerotic heart disease of native coronary artery with unspecified angina pectoris: Secondary | ICD-10-CM | POA: Diagnosis not present

## 2020-10-06 DIAGNOSIS — E119 Type 2 diabetes mellitus without complications: Secondary | ICD-10-CM | POA: Insufficient documentation

## 2020-10-06 DIAGNOSIS — I25110 Atherosclerotic heart disease of native coronary artery with unstable angina pectoris: Secondary | ICD-10-CM | POA: Diagnosis not present

## 2020-10-06 DIAGNOSIS — I10 Essential (primary) hypertension: Secondary | ICD-10-CM | POA: Diagnosis present

## 2020-10-06 DIAGNOSIS — I119 Hypertensive heart disease without heart failure: Secondary | ICD-10-CM | POA: Diagnosis not present

## 2020-10-06 DIAGNOSIS — Z87891 Personal history of nicotine dependence: Secondary | ICD-10-CM | POA: Diagnosis not present

## 2020-10-06 DIAGNOSIS — E1159 Type 2 diabetes mellitus with other circulatory complications: Secondary | ICD-10-CM | POA: Diagnosis present

## 2020-10-06 DIAGNOSIS — E785 Hyperlipidemia, unspecified: Secondary | ICD-10-CM | POA: Insufficient documentation

## 2020-10-06 DIAGNOSIS — Z9861 Coronary angioplasty status: Secondary | ICD-10-CM

## 2020-10-06 HISTORY — PX: CORONARY BALLOON ANGIOPLASTY: CATH118233

## 2020-10-06 HISTORY — PX: LEFT HEART CATH AND CORONARY ANGIOGRAPHY: CATH118249

## 2020-10-06 HISTORY — DX: Angina pectoris, unspecified: I20.9

## 2020-10-06 LAB — POCT ACTIVATED CLOTTING TIME
Activated Clotting Time: 338 seconds
Activated Clotting Time: >1000 seconds
Activated Clotting Time: 321 seconds

## 2020-10-06 LAB — GLUCOSE, CAPILLARY
Glucose-Capillary: 163 mg/dL — ABNORMAL HIGH (ref 70–99)
Glucose-Capillary: 129 mg/dL — ABNORMAL HIGH (ref 70–99)

## 2020-10-06 SURGERY — LEFT HEART CATH AND CORONARY ANGIOGRAPHY
Anesthesia: LOCAL

## 2020-10-06 MED ORDER — ATORVASTATIN CALCIUM 80 MG PO TABS
80.0000 mg | ORAL_TABLET | Freq: Every day | ORAL | Status: DC
  Administered 2020-10-06: 80 mg via ORAL
  Filled 2020-10-06: qty 1

## 2020-10-06 MED ORDER — ISOSORBIDE MONONITRATE ER 30 MG PO TB24: 30.0000 mg | ORAL_TABLET | Freq: Every day | ORAL | Status: DC

## 2020-10-06 MED ORDER — MIDAZOLAM HCL 2 MG/2ML IJ SOLN
INTRAMUSCULAR | Status: AC
  Filled 2020-10-06: qty 2

## 2020-10-06 MED ORDER — SODIUM CHLORIDE 0.9 % WEIGHT BASED INFUSION
3.0000 mL/kg/h | INTRAVENOUS | Status: DC
  Administered 2020-10-06: 3 mL/kg/h via INTRAVENOUS

## 2020-10-06 MED ORDER — SODIUM CHLORIDE 0.9 % IV SOLN: INTRAVENOUS | Status: AC

## 2020-10-06 MED ORDER — HEPARIN (PORCINE) IN NACL 1000-0.9 UT/500ML-% IV SOLN
INTRAVENOUS | Status: DC | PRN
  Administered 2020-10-06 (×2): 500 mL

## 2020-10-06 MED ORDER — EZETIMIBE 10 MG PO TABS
10.0000 mg | ORAL_TABLET | Freq: Every day | ORAL | Status: DC
  Administered 2020-10-06: 10 mg via ORAL
  Filled 2020-10-06: qty 1

## 2020-10-06 MED ORDER — NITROGLYCERIN 1 MG/10 ML FOR IR/CATH LAB
INTRA_ARTERIAL | Status: DC | PRN
  Administered 2020-10-06: 200 ug via INTRACORONARY
  Administered 2020-10-06: 200 ug via INTRA_ARTERIAL
  Administered 2020-10-06: 200 ug via INTRACORONARY

## 2020-10-06 MED ORDER — HEPARIN SODIUM (PORCINE) 1000 UNIT/ML IJ SOLN
INTRAMUSCULAR | Status: AC
  Filled 2020-10-06: qty 1

## 2020-10-06 MED ORDER — NITROGLYCERIN 0.4 MG SL SUBL: 0.4000 mg | SUBLINGUAL_TABLET | SUBLINGUAL | Status: DC | PRN

## 2020-10-06 MED ORDER — LIDOCAINE HCL (PF) 1 % IJ SOLN
INTRAMUSCULAR | Status: AC
  Filled 2020-10-06: qty 30

## 2020-10-06 MED ORDER — MIDAZOLAM HCL 2 MG/2ML IJ SOLN
INTRAMUSCULAR | Status: DC | PRN
  Administered 2020-10-06 (×4): 1 mg via INTRAVENOUS

## 2020-10-06 MED ORDER — VERAPAMIL HCL 2.5 MG/ML IV SOLN
INTRAVENOUS | Status: DC | PRN
  Administered 2020-10-06: 10 mL via INTRA_ARTERIAL

## 2020-10-06 MED ORDER — ACETAMINOPHEN 325 MG PO TABS: 650.0000 mg | ORAL_TABLET | ORAL | Status: DC | PRN

## 2020-10-06 MED ORDER — ASPIRIN 81 MG PO CHEW
81.0000 mg | CHEWABLE_TABLET | Freq: Every day | ORAL | Status: DC
  Administered 2020-10-07: 81 mg via ORAL
  Filled 2020-10-06: qty 1

## 2020-10-06 MED ORDER — PANTOPRAZOLE SODIUM 40 MG PO TBEC
40.0000 mg | DELAYED_RELEASE_TABLET | Freq: Every day | ORAL | Status: DC
  Administered 2020-10-07: 40 mg via ORAL
  Filled 2020-10-06: qty 1

## 2020-10-06 MED ORDER — ADULT MULTIVITAMIN W/MINERALS CH
1.0000 | ORAL_TABLET | Freq: Every day | ORAL | Status: DC
  Administered 2020-10-07: 1 via ORAL
  Filled 2020-10-06: qty 1

## 2020-10-06 MED ORDER — SODIUM CHLORIDE 0.9 % IV SOLN: 250.0000 mL | INTRAVENOUS | Status: DC | PRN

## 2020-10-06 MED ORDER — SODIUM CHLORIDE 0.9% FLUSH: 3.0000 mL | INTRAVENOUS | Status: DC | PRN

## 2020-10-06 MED ORDER — METOPROLOL SUCCINATE ER 25 MG PO TB24
25.0000 mg | ORAL_TABLET | Freq: Every day | ORAL | Status: DC
  Administered 2020-10-07: 25 mg via ORAL
  Filled 2020-10-06: qty 1

## 2020-10-06 MED ORDER — ASPIRIN 81 MG PO CHEW: 81.0000 mg | CHEWABLE_TABLET | ORAL | Status: DC

## 2020-10-06 MED ORDER — LABETALOL HCL 5 MG/ML IV SOLN: 10.0000 mg | INTRAVENOUS | Status: AC | PRN

## 2020-10-06 MED ORDER — SODIUM CHLORIDE 0.9 % WEIGHT BASED INFUSION
1.0000 mL/kg/h | INTRAVENOUS | Status: DC
  Administered 2020-10-06: 1 mL/kg/h via INTRAVENOUS

## 2020-10-06 MED ORDER — ISOSORBIDE MONONITRATE ER 60 MG PO TB24
60.0000 mg | ORAL_TABLET | Freq: Every day | ORAL | Status: DC
  Administered 2020-10-07: 60 mg via ORAL
  Filled 2020-10-06: qty 1

## 2020-10-06 MED ORDER — FENTANYL CITRATE (PF) 100 MCG/2ML IJ SOLN
INTRAMUSCULAR | Status: AC
  Filled 2020-10-06: qty 2

## 2020-10-06 MED ORDER — NITROGLYCERIN 1 MG/10 ML FOR IR/CATH LAB
INTRA_ARTERIAL | Status: AC
  Filled 2020-10-06: qty 10

## 2020-10-06 MED ORDER — FENTANYL CITRATE (PF) 100 MCG/2ML IJ SOLN
INTRAMUSCULAR | Status: DC | PRN
  Administered 2020-10-06 (×3): 25 ug via INTRAVENOUS
  Administered 2020-10-06: 50 ug via INTRAVENOUS
  Administered 2020-10-06 (×2): 25 ug via INTRAVENOUS

## 2020-10-06 MED ORDER — SODIUM CHLORIDE 0.9% FLUSH: 3.0000 mL | Freq: Two times a day (BID) | INTRAVENOUS | Status: DC

## 2020-10-06 MED ORDER — VERAPAMIL HCL 2.5 MG/ML IV SOLN
INTRAVENOUS | Status: AC
  Filled 2020-10-06: qty 2

## 2020-10-06 MED ORDER — TICAGRELOR 90 MG PO TABS: 90.0000 mg | ORAL_TABLET | ORAL | Status: DC

## 2020-10-06 MED ORDER — DIPHENHYDRAMINE HCL 25 MG PO CAPS
50.0000 mg | ORAL_CAPSULE | Freq: Every day | ORAL | Status: DC
  Administered 2020-10-06: 50 mg via ORAL
  Filled 2020-10-06: qty 2

## 2020-10-06 MED ORDER — TICAGRELOR 90 MG PO TABS
90.0000 mg | ORAL_TABLET | Freq: Two times a day (BID) | ORAL | Status: DC
  Administered 2020-10-06 – 2020-10-07 (×2): 90 mg via ORAL
  Filled 2020-10-06 (×2): qty 1

## 2020-10-06 MED ORDER — SODIUM CHLORIDE 0.9% FLUSH
3.0000 mL | Freq: Two times a day (BID) | INTRAVENOUS | Status: DC
  Administered 2020-10-07: 3 mL via INTRAVENOUS

## 2020-10-06 MED ORDER — HEPARIN (PORCINE) IN NACL 1000-0.9 UT/500ML-% IV SOLN
INTRAVENOUS | Status: AC
  Filled 2020-10-06: qty 1000

## 2020-10-06 MED ORDER — IOHEXOL 350 MG/ML SOLN
INTRAVENOUS | Status: DC | PRN
  Administered 2020-10-06: 200 mL

## 2020-10-06 MED ORDER — LIDOCAINE HCL (PF) 1 % IJ SOLN
INTRAMUSCULAR | Status: DC | PRN
  Administered 2020-10-06: 2 mL

## 2020-10-06 MED ORDER — HYDRALAZINE HCL 20 MG/ML IJ SOLN: 10.0000 mg | INTRAMUSCULAR | Status: AC | PRN

## 2020-10-06 MED ORDER — AMLODIPINE BESYLATE 5 MG PO TABS
5.0000 mg | ORAL_TABLET | Freq: Every day | ORAL | Status: DC
  Administered 2020-10-07: 5 mg via ORAL
  Filled 2020-10-06: qty 1

## 2020-10-06 MED ORDER — HEPARIN SODIUM (PORCINE) 1000 UNIT/ML IJ SOLN
INTRAMUSCULAR | Status: DC | PRN
  Administered 2020-10-06: 2000 [IU] via INTRAVENOUS
  Administered 2020-10-06: 6000 [IU] via INTRAVENOUS
  Administered 2020-10-06: 4000 [IU] via INTRAVENOUS

## 2020-10-06 MED ORDER — ONDANSETRON HCL 4 MG/2ML IJ SOLN: 4.0000 mg | Freq: Four times a day (QID) | INTRAMUSCULAR | Status: DC | PRN

## 2020-10-06 SURGICAL SUPPLY — 26 items
BALLN SAPPHIRE 1.5X12 (BALLOONS) ×2
BALLN SAPPHIRE 2.0X12 (BALLOONS) ×4
BALLN SPRINT LEG OTW 1.25X10 (BALLOONS) ×2
BALLOON SAPPHIRE 1.5X12 (BALLOONS) ×1 IMPLANT
BALLOON SAPPHIRE 2.0X12 (BALLOONS) ×2 IMPLANT
BALLOON SPRINT LEG OTW 1.25X10 (BALLOONS) ×1 IMPLANT
CATH 5FR JL3.5 JR4 ANG PIG MP (CATHETERS) ×2 IMPLANT
CATH INFINITI 5FR JL4 (CATHETERS) ×2 IMPLANT
CATH LAUNCHER 5F EBU4.0 (CATHETERS) ×2 IMPLANT
CATH LAUNCHER 5F JL4 (CATHETERS) ×1 IMPLANT
CATH LAUNCHER 5F JR4 (CATHETERS) ×2 IMPLANT
CATHETER LAUNCHER 5F JL4 (CATHETERS) ×2
DEVICE RAD COMP TR BAND LRG (VASCULAR PRODUCTS) ×2 IMPLANT
GLIDESHEATH SLEND SS 6F .021 (SHEATH) ×2 IMPLANT
GUIDEWIRE INQWIRE 1.5J.035X260 (WIRE) ×1 IMPLANT
INQWIRE 1.5J .035X260CM (WIRE) ×2
KIT ENCORE 26 ADVANTAGE (KITS) ×2 IMPLANT
KIT HEART LEFT (KITS) ×2 IMPLANT
KIT HEMO VALVE WATCHDOG (MISCELLANEOUS) ×2 IMPLANT
PACK CARDIAC CATHETERIZATION (CUSTOM PROCEDURE TRAY) ×2 IMPLANT
TRANSDUCER W/STOPCOCK (MISCELLANEOUS) ×2 IMPLANT
TUBING CIL FLEX 10 FLL-RA (TUBING) ×2 IMPLANT
WIRE ASAHI FIELDER XT 300CM (WIRE) ×2 IMPLANT
WIRE ASAHI PROWATER 180CM (WIRE) ×4 IMPLANT
WIRE FIGHTER CROSSING 190CM (WIRE) ×2 IMPLANT
WIRE HI TORQ BMW 190CM (WIRE) ×2 IMPLANT

## 2020-10-06 NOTE — Progress Notes (Signed)
Upon entered room, to release air from TR Band, patient reports  "having chest pressure earlier and took own nitroglycerin,,, and it eases off" States chest pressure was felt when she saw her "blood pressure high" Educated  On the importance of not taking own home medication and to call Nursing staff when having chest pain. Vial of NTG removed from room and placed to patient's chart.; Oncoming nurse made aware. Leveda Anna, BSN, RN.

## 2020-10-06 NOTE — H&P (Signed)
Admit date: 10/06/2020  Primary Cardiologist Chief complaint/reason for admission:JV  HPI: 69 y/o with CAD, multiple PCI.  She has been having worsening angina refractory to medical therapy.     PMH:    Past Medical History:  Diagnosis Date  . Arthritis    "probably in my hands" (09/04/2013)  . 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 Cancer ([place])    Phreesia 05/17/2020  . Cervical cancer ([place]) 1986  . Coronary atherosclerosis of native coronary artery 09/04/2013   RCA and obtuse marginal stent/DES-2010  . Diabetes mellitus without complication ([place])    Phreesia 05/17/2020  . Fibromyalgia    "dx'd in 1994"  . Full dentures   . GERD (gastroesophageal reflux disease)   . Hyperlipidemia   . Hypertension   . Myocardial infarction ([place])    Phreesia 05/17/2020  . Obesity   . Shortness of breath    "just related to angina I was having recently" (09/04/2013)  . Type II diabetes mellitus ([place])    followed by pcp  . Wears glasses     PSH:    Past Surgical History:  Procedure Laterality Date  . CARPAL TUNNEL RELEASE Right 07/1992  . CERVICAL CONE BIOPSY  03/1985  . CORONARY ANGIOPLASTY WITH STENT PLACEMENT  07/2009, 08/2009; 09/04/2013   "1+1+2; total of 4" (09/04/2013)  . FRACTURE SURGERY N/A    Phreesia 05/17/2020  . LEFT HEART CATHETERIZATION WITH CORONARY ANGIOGRAM N/A 09/04/2013   Procedure: LEFT HEART CATHETERIZATION WITH CORONARY ANGIOGRAM;  Surgeon: Candee Furbish, MD;  Location: [facility] CATH LAB;  Service: Cardiovascular;  Laterality: N/A;  . MOHS SURGERY Left 05/2008   "nostril"  . ORIF ANKLE FRACTURE Right 10/10/2018   Procedure: OPEN REDUCTION INTERNAL FIXATION (ORIF) ANKLE FRACTURE;  Surgeon: Rod Can, MD;  Location: [facility] ORS;  Service: Orthopedics;  Laterality: Right;  . SHOULDER OPEN ROTATOR CUFF REPAIR Right 10/2004   "got 5 pins in" (09/04/2013)  . TUBAL LIGATION Bilateral 1979    ALLERGIES:   Plavix [clopidogrel bisulfate] and Adhesive [tape]  Prior to  Admit Meds:   Medications Prior to Admission  Medication Sig Dispense Refill Last Dose  . amLODipine (NORVASC) 5 MG tablet Take 1 tablet (5 mg total) by mouth in the morning and at bedtime. 180 tablet 3  at 0530  . atorvastatin (LIPITOR) 80 MG tablet TAKE 1 TABLET(80 MG) BY MOUTH DAILY AT 6 PM (Patient taking differently: Take 80 mg by mouth at bedtime. ) 90 tablet 2 10/05/2020 at Unknown time  . diphenhydrAMINE (BENADRYL) 50 MG capsule Take 50 mg by mouth at bedtime.    10/05/2020 at Unknown time  . ezetimibe (ZETIA) 10 MG tablet TAKE 1 TABLET(10 MG) BY MOUTH DAILY (Patient taking differently: Take 10 mg by mouth at bedtime. ) 90 tablet 2 10/05/2020 at Unknown time  . glucose blood (ACCU-CHEK GUIDE) test strip Use as instructed 300 each 12 10/06/2020 at Unknown time  . isosorbide mononitrate (IMDUR) 30 MG 24 hr tablet Take 30 mg by mouth in the morning and at bedtime.   10/06/2020 at 0530  . isosorbide mononitrate (IMDUR) 60 MG 24 hr tablet Take 1 tablet (60 mg total) by mouth daily. 90 tablet 3 10/06/2020 at 0530  . metoprolol succinate (TOPROL-XL) 25 MG 24 hr tablet TAKE 1 TABLET(25 MG) BY MOUTH DAILY (Patient taking differently: Take 25 mg by mouth daily. ) 90 tablet 2 10/06/2020 at 0530  . Multiple Vitamin (MULTIVITAMIN WITH MINERALS) TABS tablet Take 1  tablet by mouth daily. Centrum Silver for Women   10/06/2020 at 0530  . nitroGLYCERIN (NITROSTAT) 0.4 MG SL tablet Place 1 tablet (0.4 mg total) under the tongue every 5 (five) minutes as needed for chest pain. (Patient taking differently: Place 0.4 mg under the tongue every 5 (five) minutes x 3 doses as needed for chest pain. ) 25 tablet 3 Past Week at Unknown time  . omeprazole (PRILOSEC) 20 MG capsule Take 20 mg by mouth daily before breakfast.    10/06/2020 at 0530  . SitaGLIPtin-MetFORMIN HCl 50-1000 MG TB24 Take 2 tablets by mouth every morning. (Patient taking differently: Take 1 tablet by mouth in the morning and at bedtime. ) 180 tablet 0 10/05/2020  at Unknown time  . ticagrelor (BRILINTA) 90 MG TABS tablet Take 1 tablet (90 mg total) by mouth 2 (two) times daily. 180 tablet 2 10/06/2020 at 0530  . valsartan (DIOVAN) 320 MG tablet TAKE 1 TABLET(320 MG) BY MOUTH DAILY (Patient taking differently: Take 320 mg by mouth daily. ) 90 tablet 1 10/06/2020 at 0530   Family HX:    Family History  Problem Relation Age of Onset  . Heart disease Mother 65       AMI; cause of death  . Hyperlipidemia Mother   . Cancer Father        Throat  . Hyperlipidemia Brother   . Hypertension Brother   . Drug abuse Brother   . Diabetes Daughter   . Drug abuse Daughter   . Heart disease Daughter   . Lupus Daughter   . Heart disease Paternal Grandmother   . Stroke Paternal Grandfather    Social HX:    Social History   Socioeconomic History  . Marital status: Married    Spouse name: Not on file  . Number of children: Not on file  . Years of education: Not on file  . Highest education level: Not on file  Occupational History  . Not on file  Tobacco Use  . Smoking status: Former Smoker    Packs/day: 1.00    Years: 29.00    Pack years: 29.00    Types: Cigarettes    Quit date: 06/14/2006    Years since quitting: 14.3  . Smokeless tobacco: Never Used  Vaping Use  . Vaping Use: Never used  Substance and Sexual Activity  . Alcohol use: No  . Drug use: No  . Sexual activity: Yes  Other Topics Concern  . Not on file  Social History Narrative   Marital status: married      Children: grown children; 9 grandchildren; 1 gg      Lives: with husband, 2 birds/[employer]      Employment:  [employer] x 18 years; billing      Tobacco: quit smoking 05/2006      Alcohol:  None     Exercise: walking 30 minutes per day   Social Determinants of Health   Financial Resource Strain:   . Difficulty of Paying Living Expenses: Not on file  Food Insecurity:   . Worried About Charity fundraiser in the Last Year: Not on file  . Ran Out of Food in the Last  Year: Not on file  Transportation Needs:   . Lack of Transportation (Medical): Not on file  . Lack of Transportation (Non-Medical): Not on file  Physical Activity:   . Days of Exercise per Week: Not on file  . Minutes of Exercise per Session: Not on file  Stress:   . Feeling of Stress : Not on file  Social Connections:   . Frequency of Communication with Friends and Family: Not on file  . Frequency of Social Gatherings with Friends and Family: Not on file  . Attends Religious Services: Not on file  . Active Member of Clubs or Organizations: Not on file  . Attends Archivist Meetings: Not on file  . Marital Status: Not on file  Intimate Partner Violence:   . Fear of Current or Ex-Partner: Not on file  . Emotionally Abused: Not on file  . Physically Abused: Not on file  . Sexually Abused: Not on file     ROS:  All 11 ROS were addressed and are negative except what is stated in the HPI  PHYSICAL EXAM Vitals:   10/06/20 1015  BP: (!) 111/50  Pulse: 72  Temp: 99.1 F (37.3 C)  SpO2: 100%   General: Well developed, well nourished, in no acute distress Head: Eyes PERRLA, No xanthomas.   Normal cephalic and atramatic  Lungs:   Clear bilaterally to auscultation and percussion. Heart:   HRRR S1 S2 Pulses are 2+ & equal.            No carotid bruit. No JVD.  No abdominal bruits. No femoral bruits. Abdomen: Bowel sounds are positive, abdomen soft and non-tender without masses or                  Hernia's noted. Msk:  Back normal, normal gait. Normal strength and tone for age. Extremities:   No clubbing, cyanosis or edema.  DP +1 Neuro: Alert and oriented X 3. Psych:  Good affect, responds appropriately   Labs:   Lab Results  Component Value Date   WBC 9.2 10/02/2020   HGB 12.2 10/02/2020   HCT 35.0 10/02/2020   MCV 92 10/02/2020   PLT 258 10/02/2020    Recent Labs  Lab 10/02/20 1400  NA 141  K 5.1  CL 102  CO2 19*  BUN 20  CREATININE 0.96  CALCIUM 9.7   GLUCOSE 89   Lab Results  Component Value Date   CKTOTAL 59 08/07/2009   CKMB 8.8 (H) 08/07/2009   TROPONINI <0.03 08/03/2016   No results found for: PTT Lab Results  Component Value Date   INR 0.95 08/03/2016   INR 1.04 09/04/2013   INR 1.03 09/11/2009     Lab Results  Component Value Date   CHOL 126 01/06/2020   CHOL 140 09/20/2018   CHOL 115 03/16/2018   Lab Results  Component Value Date   HDL 39 (L) 01/06/2020   HDL 36 (L) 09/20/2018   HDL 30 (L) 03/16/2018   Lab Results  Component Value Date   LDLCALC 61 01/06/2020   LDLCALC 59 09/20/2018   LDLCALC 44 03/16/2018   Lab Results  Component Value Date   TRIG 149 01/06/2020   TRIG 227 (H) 09/20/2018   TRIG 203 (H) 03/16/2018   Lab Results  Component Value Date   CHOLHDL 3.2 01/06/2020   CHOLHDL 3.9 09/20/2018   CHOLHDL 3.8 03/16/2018   No results found for: LDLDIRECT    Radiology:  No results found.  EKG:  NSR, RBBB  ASSESSMENT: CAD with angina  PLAN:  Plan for cath  Cardiac catheterization was discussed with the patient fully. The patient understands that risks include but are not limited to stroke (1 in 1000), death (1 in 4), kidney failure [usually temporary] (1 in 500), bleeding (  1 in 200), allergic reaction [possibly serious] (1 in 200).  The patient understands and is willing to proceed.     Larae Grooms, MD  10/06/2020  10:49 AM

## 2020-10-06 NOTE — Interval H&P Note (Signed)
Cath Lab Visit (complete for each Cath Lab visit)  Clinical Evaluation Leading to the Procedure:   ACS: No.  Non-ACS:    Anginal Classification: CCS III  Anti-ischemic medical therapy: Maximal Therapy (2 or more classes of medications)  Non-Invasive Test Results: No non-invasive testing performed  Prior CABG: No previous CABG      History and Physical Interval Note:  10/06/2020 1:11 PM  Suzanne Watkins  has presented today for surgery, with the diagnosis of Angina.  The various methods of treatment have been discussed with the patient and family. After consideration of risks, benefits and other options for treatment, the patient has consented to  Procedure(s): LEFT HEART CATH AND CORONARY ANGIOGRAPHY (N/A) as a surgical intervention.  The patient's history has been reviewed, patient examined, no change in status, stable for surgery.  I have reviewed the patient's chart and labs.  Questions were answered to the patient's satisfaction.     Larae Grooms

## 2020-10-07 ENCOUNTER — Encounter (HOSPITAL_COMMUNITY): Payer: Self-pay | Admitting: Interventional Cardiology

## 2020-10-07 DIAGNOSIS — I119 Hypertensive heart disease without heart failure: Secondary | ICD-10-CM | POA: Diagnosis not present

## 2020-10-07 DIAGNOSIS — E119 Type 2 diabetes mellitus without complications: Secondary | ICD-10-CM | POA: Diagnosis not present

## 2020-10-07 DIAGNOSIS — E785 Hyperlipidemia, unspecified: Secondary | ICD-10-CM | POA: Diagnosis not present

## 2020-10-07 DIAGNOSIS — I25110 Atherosclerotic heart disease of native coronary artery with unstable angina pectoris: Secondary | ICD-10-CM | POA: Diagnosis not present

## 2020-10-07 DIAGNOSIS — I25119 Atherosclerotic heart disease of native coronary artery with unspecified angina pectoris: Secondary | ICD-10-CM | POA: Diagnosis not present

## 2020-10-07 LAB — CBC
HCT: 36.9 % (ref 36.0–46.0)
Hemoglobin: 12.5 g/dL (ref 12.0–15.0)
MCH: 31 pg (ref 26.0–34.0)
MCHC: 33.9 g/dL (ref 30.0–36.0)
MCV: 91.6 fL (ref 80.0–100.0)
Platelets: 237 10*3/uL (ref 150–400)
RBC: 4.03 MIL/uL (ref 3.87–5.11)
RDW: 13 % (ref 11.5–15.5)
WBC: 16.7 10*3/uL — ABNORMAL HIGH (ref 4.0–10.5)
nRBC: 0 % (ref 0.0–0.2)

## 2020-10-07 LAB — BASIC METABOLIC PANEL
Anion gap: 11 (ref 5–15)
BUN: 12 mg/dL (ref 8–23)
CO2: 23 mmol/L (ref 22–32)
Calcium: 9.6 mg/dL (ref 8.9–10.3)
Chloride: 101 mmol/L (ref 98–111)
Creatinine, Ser: 0.86 mg/dL (ref 0.44–1.00)
GFR, Estimated: >60 mL/min (ref >60)
Glucose, Bld: 171 mg/dL — ABNORMAL HIGH (ref 70–99)
Potassium: 4.7 mmol/L (ref 3.5–5.1)
Sodium: 135 mmol/L (ref 135–145)

## 2020-10-07 MED ORDER — ASPIRIN 81 MG PO CHEW: 81.0000 mg | CHEWABLE_TABLET | Freq: Every day | ORAL | 0 refills | Status: AC

## 2020-10-07 NOTE — Progress Notes (Signed)
CARDIAC REHAB PHASE I   PRE:  Rate/Rhythm: 95 SR  BP:  Supine:   Sitting: 127/75  Standing:    SaO2: 96%RA  MODE:  Ambulation: 400 ft   POST:  Rate/Rhythm: 97 SR  BP:  Supine:   Sitting: 96/62, 116/63  Standing:    SaO2: 98%RA 0830-0915 Pt walked 400 ft on RA with steady gait and tolerated well. BP a little lower at walk but improved with second reading. No dizziness but she said she may have felt a slight lightheadedness. Pt has been on briiinta so knows importance of taking it. Reviewed NTG use. Gave heart healthy and diabetic diets. Discussed walking for exercise. Pt voiced understanding. Referred to [place] CRP 2.    Graylon Good, RN BSN  10/07/2020 9:12 AM

## 2020-10-07 NOTE — Discharge Summary (Addendum)
Discharge Summary    Patient ID: Suzanne Watkins MRN: [id number]; DOB: Oct 30, 1951  Admit date: 10/06/2020 Discharge date: 10/07/2020  Primary Care Provider: Maximiano Coss, NP  Primary Cardiologist: Larae Grooms, MD  Primary Electrophysiologist:  None   Discharge Diagnoses    Principal Problem:   Angina pectoris [facility]) Active Problems:   Diabetes ([place])   Hyperlipidemia   Hypertensive heart disease    Diagnostic Studies/Procedures    Cath: 10/06/20   Previously placed Mid LAD stent (unknown type) is widely patent.  Previously placed Prox RCA stent (unknown type) is widely patent.  Previously placed Mid RCA to Dist RCA stent (unknown type) is widely patent.  RPDA lesion is 50% stenosed.  2nd RPL lesion is 100% stenosed. This behaved like a CTO. Balloon angioplasty was performed using a BALLOON SPRINT LEG OTW 1.25X10. Post intervention, there is a 80% residual stenosis.  Previously placed 1st Mrg-1 stent (unknown type) is widely patent.  1st Mrg-2 lesion is 90% stenosed.  Balloon angioplasty was performed using a BALLOON SAPPHIRE 2.0X12. Unable to deliver stent due to tortuosity in vessel.  Post intervention, there is a 10% residual stenosis.  The left ventricular systolic function is normal.  LV end diastolic pressure is normal.  The left ventricular ejection fraction is 55-65% by visual estimate.  There is no aortic valve stenosis.  Post intervention, there is a 80% residual stenosis.  Balloon angioplasty was performed using a BALLOON SPRINT LEG OTW 1.25X10.   Watch overnight.  Continue aggressive medical therapy.  If she has recurrent angina, will have to bring her back for cath from the groin approach.  I think this will give [place] a better chance to deliver a stent to the OM compared to having to use a 5 [place] guide from the right radial approach.  Going forward, would not use radial approach.  It worked when we only had to fix disease in proximal vessels  but clearly is not supportive enough when having to treat more distal disease.  Continue aspirin and ticagrelor.  Restart Metformin in 48 hours.  Diagnostic Dominance: Right  Intervention    _____________   History of Present Illness     Suzanne Watkins is a 69 y.o. female with PMH of CAD s/p prior stenting, DM, HTN, HLD, GERD who presented for outpatient cardiac cath with symptoms of unstable angina.   Hospital Course      Underwent cardiac cath noted above with patent stents in the mLAD, pRCA and mRCA. Lesion in the 2nd RPL which was 100% stenosed with attempted balloon angioplasty, but 80% residual stenosis. 1st OM lesion of 90% which was treated with balloon angioplasty but unable to deliver stent due to tortuosity. Plan to continue on DAPT with ASA/Brilinta for at least 6 months. If she develops recurrent angina will need to consider cath from femoral access. She was able to ambulate with cardiac rehab without recurrent chest pain. She was continued on her home medications without significant change.   Did the patient have an acute coronary syndrome (MI, NSTEMI, STEMI, etc) this admission?:  No                               Did the patient have a percutaneous coronary intervention (stent / angioplasty)?:  Yes.     Cath/PCI Registry Performance & Quality Measures: 1. Aspirin prescribed? - Yes 2. ADP Receptor Inhibitor (Plavix/Clopidogrel, Brilinta/Ticagrelor or Effient/Prasugrel) prescribed (includes medically managed patients)? - Yes  3. High Intensity Statin (Lipitor 40-80mg  or Crestor 20-40mg ) prescribed? - Yes 4. For EF <40%, was ACEI/ARB prescribed? - Not Applicable (EF >/= 81%) 5. For EF <40%, Aldosterone Antagonist (Spironolactone or Eplerenone) prescribed? - Not Applicable (EF >/= 82%) 6. Cardiac Rehab Phase II ordered? - Yes       _____________  Discharge Vitals Blood pressure 114/61, pulse 84, temperature 98.6 F (37 C), temperature source Oral, resp. rate 20,  height 5\' 2"  (1.575 m), weight 80.9 kg, SpO2 98 %.  Filed Weights   10/06/20 1015 10/06/20 1705  Weight: 80.3 kg 80.9 kg    Labs & Radiologic Studies    CBC Recent Labs    10/07/20 0432  WBC 16.7*  HGB 12.5  HCT 36.9  MCV 91.6  PLT 993   Basic Metabolic Panel Recent Labs    10/07/20 0432  NA 135  K 4.7  CL 101  CO2 23  GLUCOSE 171*  BUN 12  CREATININE 0.86  CALCIUM 9.6   Liver Function Tests No results for input(s): AST, ALT, ALKPHOS, BILITOT, PROT, ALBUMIN in the last 72 hours. No results for input(s): LIPASE, AMYLASE in the last 72 hours. High Sensitivity Troponin:   No results for input(s): TROPONINIHS in the last 720 hours.  BNP Invalid input(s): POCBNP D-Dimer No results for input(s): DDIMER in the last 72 hours. Hemoglobin A1C No results for input(s): HGBA1C in the last 72 hours. Fasting Lipid Panel No results for input(s): CHOL, HDL, LDLCALC, TRIG, CHOLHDL, LDLDIRECT in the last 72 hours. Thyroid Function Tests No results for input(s): TSH, T4TOTAL, T3FREE, THYROIDAB in the last 72 hours.  Invalid input(s): FREET3 _____________  CARDIAC CATHETERIZATION  Result Date: 10/06/2020  Previously placed Mid LAD stent (unknown type) is widely patent.  Previously placed Prox RCA stent (unknown type) is widely patent.  Previously placed Mid RCA to Dist RCA stent (unknown type) is widely patent.  RPDA lesion is 50% stenosed.  2nd RPL lesion is 100% stenosed. This behaved like a CTO. Balloon angioplasty was performed using a BALLOON SPRINT LEG OTW 1.25X10. Post intervention, there is a 80% residual stenosis.  Previously placed 1st Mrg-1 stent (unknown type) is widely patent.  1st Mrg-2 lesion is 90% stenosed.  Balloon angioplasty was performed using a BALLOON SAPPHIRE 2.0X12. Unable to deliver stent due to tortuosity in vessel.  Post intervention, there is a 10% residual stenosis.  The left ventricular systolic function is normal.  LV end diastolic pressure is  normal.  The left ventricular ejection fraction is 55-65% by visual estimate.  There is no aortic valve stenosis.  Post intervention, there is a 80% residual stenosis.  Balloon angioplasty was performed using a BALLOON SPRINT LEG OTW 1.25X10.  Watch overnight.  Continue aggressive medical therapy.  If she has recurrent angina, will have to bring her back for cath from the groin approach.  I think this will give [place] a better chance to deliver a stent to the OM compared to having to use a 5 [place] guide from the right radial approach.  Going forward, would not use radial approach.  It worked when we only had to fix disease in proximal vessels but clearly is not supportive enough when having to treat more distal disease. Continue aspirin and ticagrelor.  Restart Metformin in 48 hours.   Disposition   Pt is being discharged home today in good condition.  Follow-up Plans & Appointments     Follow-up Information    Jettie Booze, MD Follow up  on 10/21/2020.   Specialties: Cardiology, Radiology, Interventional Cardiology Why: at 2:40pm for your follow up appt Contact information: [address] [place] [address] [phone]              Discharge Instructions    Amb Referral to Cardiac Rehabilitation   Complete by: As directed    Diagnosis: PTCA   After initial evaluation and assessments completed: Virtual Based Care may be provided alone or in conjunction with Phase 2 Cardiac Rehab based on patient barriers.: Yes   Call MD for:  difficulty breathing, headache or visual disturbances   Complete by: As directed    Call MD for:  persistant dizziness or light-headedness   Complete by: As directed    Call MD for:  redness, tenderness, or signs of infection (pain, swelling, redness, odor or green/yellow discharge around incision site)   Complete by: As directed    Diet - low sodium heart healthy   Complete by: As directed    Discharge instructions   Complete  by: As directed    Radial Site Care Refer to this sheet in the next few weeks. These instructions provide you with information on caring for yourself after your procedure. Your caregiver may also give you more specific instructions. Your treatment has been planned according to current medical practices, but problems sometimes occur. Call your caregiver if you have any problems or questions after your procedure. HOME CARE INSTRUCTIONS You may shower the day after the procedure.Remove the bandage (dressing) and gently wash the site with plain soap and water.Gently pat the site dry.  Do not apply powder or lotion to the site.  Do not submerge the affected site in water for 3 to 5 days.  Inspect the site at least twice daily.  Do not flex or bend the affected arm for 24 hours.  No lifting over 5 pounds (2.3 kg) for 5 days after your procedure.  Do not drive home if you are discharged the same day of the procedure. Have someone else drive you.  You may drive 24 hours after the procedure unless otherwise instructed by your caregiver.  What to expect: Any bruising will usually fade within 1 to 2 weeks.  Blood that collects in the tissue (hematoma) may be painful to the touch. It should usually decrease in size and tenderness within 1 to 2 weeks.  SEEK IMMEDIATE MEDICAL CARE IF: You have unusual pain at the radial site.  You have redness, warmth, swelling, or pain at the radial site.  You have drainage (other than a small amount of blood on the dressing).  You have chills.  You have a fever or persistent symptoms for more than 72 hours.  You have a fever and your symptoms suddenly get worse.  Your arm becomes pale, cool, tingly, or numb.  You have heavy bleeding from the site. Hold pressure on the site.   Increase activity slowly   Complete by: As directed       Discharge Medications   Allergies as of 10/07/2020      Reactions   Plavix [clopidogrel Bisulfate] Other (See Comments)    Stomach upset   Adhesive [tape] Rash      Medication List    TAKE these medications   Accu-Chek Guide test strip Generic drug: glucose blood Use as instructed   amLODipine 5 MG tablet Commonly known as: NORVASC Take 1 tablet (5 mg total) by mouth in the morning and at bedtime.   aspirin  81 MG chewable tablet Chew 1 tablet (81 mg total) by mouth daily.   atorvastatin 80 MG tablet Commonly known as: LIPITOR TAKE 1 TABLET(80 MG) BY MOUTH DAILY AT 6 PM What changed: See the new instructions.   diphenhydrAMINE 50 MG capsule Commonly known as: BENADRYL Take 50 mg by mouth at bedtime.   ezetimibe 10 MG tablet Commonly known as: ZETIA TAKE 1 TABLET(10 MG) BY MOUTH DAILY What changed: See the new instructions.   isosorbide mononitrate 60 MG 24 hr tablet Commonly known as: IMDUR Take 1 tablet (60 mg total) by mouth daily. What changed: Another medication with the same name was removed. Continue taking this medication, and follow the directions you see here.   metoprolol succinate 25 MG 24 hr tablet Commonly known as: TOPROL-XL TAKE 1 TABLET(25 MG) BY MOUTH DAILY What changed:   how much to take  how to take this  when to take this  additional instructions   multivitamin with minerals Tabs tablet Take 1 tablet by mouth daily. Centrum Silver for Women   nitroGLYCERIN 0.4 MG SL tablet Commonly known as: Nitrostat Place 1 tablet (0.4 mg total) under the tongue every 5 (five) minutes as needed for chest pain. What changed: when to take this   omeprazole 20 MG capsule Commonly known as: PRILOSEC Take 20 mg by mouth daily before breakfast.   SitaGLIPtin-MetFORMIN HCl 50-1000 MG Tb24 Take 2 tablets by mouth every morning. What changed:   how much to take  when to take this   ticagrelor 90 MG Tabs tablet Commonly known as: Brilinta Take 1 tablet (90 mg total) by mouth 2 (two) times daily.   valsartan 320 MG tablet Commonly known as: DIOVAN TAKE 1 TABLET(320  MG) BY MOUTH DAILY What changed: See the new instructions.          Outstanding Labs/Studies   N/a   Duration of Discharge Encounter   Greater than 30 minutes including physician time.  Signed, Reino Bellis, NP 10/07/2020, 10:32 AM  I have examined the patient and reviewed assessment and plan and discussed with patient.  Agree with above as stated.    Right wrist stable.  Will continue mediocal therapy post PTCA.  If she has rorrrent sx, we can bring her back from cath from groin approach to see if we have better support to deliver stents.   She did well with rehab.  OK for discharge today.    Larae Grooms

## 2020-10-13 ENCOUNTER — Encounter (HOSPITAL_COMMUNITY): Payer: Self-pay | Admitting: Interventional Cardiology

## 2020-10-14 ENCOUNTER — Telehealth (HOSPITAL_COMMUNITY): Payer: Self-pay

## 2020-10-14 NOTE — Telephone Encounter (Signed)
Called patient to see if she is interested in the Cardiac Rehab Program. Patient expressed interest. Explained scheduling process and went over insurance, patient verbalized understanding. Will contact patient for scheduling once f/u has been completed. 

## 2020-10-19 ENCOUNTER — Other Ambulatory Visit: Payer: Self-pay | Admitting: Registered Nurse

## 2020-10-19 DIAGNOSIS — E1159 Type 2 diabetes mellitus with other circulatory complications: Secondary | ICD-10-CM

## 2020-10-20 NOTE — Progress Notes (Signed)
Cardiology Office Note   Date:  10/21/2020   ID:  Suzanne Watkins, DOB 11/17/1950, MRN [id number]  PCP:  Maximiano Coss, NP    No chief complaint on file.  CAD  Wt Readings from Last 3 Encounters:  10/21/20 178 lb 3.2 oz (80.8 kg)  10/06/20 178 lb 6.4 oz (80.9 kg)  10/02/20 178 lb 3.2 oz (80.8 kg)       History of Present Illness: Suzanne Watkins is a 69 y.o. female  Who has had CAD. She has had stents in all three coronary vessels. Most recent were in 11/14.  Sheused towalk a lot on the [employer] She retired in Jan 2019, so she joined a senior center to exercise on the treadmill.  She had a broken leg in 11/19.She had surgery in December 12/19. She did PT. No cardiac issues at that time.  She had some SHOB in Jan 2021. She has some intermittent tachycardia, when she checks her pulse ox. CT scan: done showed no PE, mild emphysema.  In 10/21, she had an episode of left arm pain with SHOB. She used a SL NTG and had some relief. She had one episode of chest tightness again and used SL NTG.   In 2021, Imdur was added and amlodipine was increased to 5 mg BID. BP improved.  The patient does not have symptoms concerning for COVID-19 infection (fever, chills, cough, or new shortness of breath).   2021 cath showed:  "Previously placed Mid LAD stent (unknown type) is widely patent.  Previously placed Prox RCA stent (unknown type) is widely patent.  Previously placed Mid RCA to Dist RCA stent (unknown type) is widely patent.  RPDA lesion is 50% stenosed.  2nd RPL lesion is 100% stenosed. This behaved like a CTO. Balloon angioplasty was performed using a BALLOON SPRINT LEG OTW 1.25X10. Post intervention, there is a 80% residual stenosis.  Previously placed 1st Mrg-1 stent (unknown type) is widely patent.  1st Mrg-2 lesion is 90% stenosed.  Balloon angioplasty was performed using a BALLOON SAPPHIRE 2.0X12. Unable to deliver stent due to tortuosity in  vessel.  Post intervention, there is a 10% residual stenosis.  The left ventricular systolic function is normal.  LV end diastolic pressure is normal.  The left ventricular ejection fraction is 55-65% by visual estimate.  There is no aortic valve stenosis.  Post intervention, there is a 80% residual stenosis.  Balloon angioplasty was performed using a BALLOON SPRINT LEG OTW 1.25X10.   Watch overnight.  Continue aggressive medical therapy.  If she has recurrent angina, will have to bring her back for cath from the groin approach.  I think this will give [place] a better chance to deliver a stent to the OM compared to having to use a 5 [place] guide from the right radial approach.  Going forward, would not use radial approach.  It worked when we only had to fix disease in proximal vessels but clearly is not supportive enough when having to treat more distal disease."  Since the cath, she has had some chest tightness after about 5 minutes, but much less intense than before angioplasty.      Past Medical History:  Diagnosis Date  . Anginal pain ([place])   . Arthritis    "probably in my hands" (09/04/2013)  . 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 Cancer ([place])    Phreesia 05/17/2020  . Cervical cancer ([place]) 1986  . Coronary atherosclerosis of native coronary artery 09/04/2013  RCA and obtuse marginal stent/DES-2010  . Diabetes mellitus without complication ([facility])    Phreesia 05/17/2020  . Fibromyalgia    "dx'd in 1994"  . Full dentures   . GERD (gastroesophageal reflux disease)   . Hyperlipidemia   . Hypertension   . Myocardial infarction ([facility])    Phreesia 05/17/2020  . Obesity   . Shortness of breath    "just related to angina I was having recently" (09/04/2013)  . Type II diabetes mellitus ([facility])    followed by pcp  . Wears glasses     Past Surgical History:  Procedure Laterality Date  . CARPAL TUNNEL RELEASE Right 07/1992  . CERVICAL CONE BIOPSY  03/1985  . CORONARY  ANGIOPLASTY WITH STENT PLACEMENT  07/2009, 08/2009; 09/04/2013   "1+1+2; total of 4" (09/04/2013)  . CORONARY BALLOON ANGIOPLASTY N/A 10/06/2020   Procedure: CORONARY BALLOON ANGIOPLASTY;  Surgeon: Corky Crafts, MD;  Location: [facility] INVASIVE CV LAB;  Service: Cardiovascular;  Laterality: N/A;  . FRACTURE SURGERY N/A    Phreesia 05/17/2020  . LEFT HEART CATH AND CORONARY ANGIOGRAPHY N/A 10/06/2020   Procedure: LEFT HEART CATH AND CORONARY ANGIOGRAPHY;  Surgeon: Corky Crafts, MD;  Location: [facility] INVASIVE CV LAB;  Service: Cardiovascular;  Laterality: N/A;  . LEFT HEART CATHETERIZATION WITH CORONARY ANGIOGRAM N/A 09/04/2013   Procedure: LEFT HEART CATHETERIZATION WITH CORONARY ANGIOGRAM;  Surgeon: Donato Schultz, MD;  Location: [facility] CATH LAB;  Service: Cardiovascular;  Laterality: N/A;  . MOHS SURGERY Left 05/2008   "nostril"  . ORIF ANKLE FRACTURE Right 10/10/2018   Procedure: OPEN REDUCTION INTERNAL FIXATION (ORIF) ANKLE FRACTURE;  Surgeon: Samson Frederic, MD;  Location: [facility] ORS;  Service: Orthopedics;  Laterality: Right;  . SHOULDER OPEN ROTATOR CUFF REPAIR Right 10/2004   "got 5 pins in" (09/04/2013)  . TUBAL LIGATION Bilateral 1979     Current Outpatient Medications  Medication Sig Dispense Refill  . amLODipine (NORVASC) 5 MG tablet Take 1 tablet (5 mg total) by mouth in the morning and at bedtime. 180 tablet 3  . aspirin 81 MG chewable tablet Chew 1 tablet (81 mg total) by mouth daily. 90 tablet 0  . atorvastatin (LIPITOR) 80 MG tablet TAKE 1 TABLET(80 MG) BY MOUTH DAILY AT 6 PM 90 tablet 2  . diphenhydrAMINE (BENADRYL) 50 MG capsule Take 50 mg by mouth at bedtime.    Marland Kitchen ezetimibe (ZETIA) 10 MG tablet TAKE 1 TABLET(10 MG) BY MOUTH DAILY (Patient taking differently: No sig reported) 90 tablet 2  . glucose blood (ACCU-CHEK GUIDE) test strip Use as instructed 300 each 12  . isosorbide mononitrate (IMDUR) 60 MG 24 hr tablet Take 1 tablet (60 mg total) by mouth daily. 90 tablet 3  . JANUMET  XR 50-1000 MG TB24 TAKE 2 TABLETS BY MOUTH EVERY MORNING 180 tablet 0  . metoprolol succinate (TOPROL-XL) 25 MG 24 hr tablet TAKE 1 TABLET(25 MG) BY MOUTH DAILY 90 tablet 2  . Multiple Vitamin (MULTIVITAMIN WITH MINERALS) TABS tablet Take 1 tablet by mouth daily. Centrum Silver for Women    . nitroGLYCERIN (NITROSTAT) 0.4 MG SL tablet Place 1 tablet (0.4 mg total) under the tongue every 5 (five) minutes as needed for chest pain. 25 tablet 3  . omeprazole (PRILOSEC) 20 MG capsule Take 20 mg by mouth daily before breakfast.     . ticagrelor (BRILINTA) 90 MG TABS tablet Take 1 tablet (90 mg total) by mouth 2 (two) times daily. 180 tablet 2  . valsartan (DIOVAN) 320 MG tablet  TAKE 1 TABLET(320 MG) BY MOUTH DAILY 90 tablet 1   No current facility-administered medications for this visit.    Allergies:   Plavix [clopidogrel bisulfate] and Adhesive [tape]    Social History:  The patient  reports that she quit smoking about 14 years ago. Her smoking use included cigarettes. She has a 29.00 pack-year smoking history. She has never used smokeless tobacco. She reports that she does not drink alcohol and does not use drugs.   Family History:  The patient's family history includes Cancer in her father; Diabetes in her daughter; Drug abuse in her brother and daughter; Heart disease in her daughter and paternal grandmother; Heart disease (age of onset: 103) in her mother; Hyperlipidemia in her brother and mother; Hypertension in her brother; Lupus in her daughter; Stroke in her paternal grandfather.    ROS:  Please see the history of present illness.   Otherwise, review of systems are positive for energy level not where it used to be.   All other systems are reviewed and negative.    PHYSICAL EXAM: VS:  BP 110/62   Pulse 78   Ht 5\' 2"  (1.575 m)   Wt 178 lb 3.2 oz (80.8 kg)   SpO2 97%   BMI 32.59 kg/m  , BMI Body mass index is 32.59 kg/m. GEN: Well nourished, well developed, in no acute distress   HEENT: normal  Neck: no JVD, carotid bruits, or masses Cardiac: RRR; no murmurs, rubs, or gallops,no edema  Respiratory:  clear to auscultation bilaterally, normal work of breathing GI: soft, nontender, nondistended, + BS MS: no deformity or atrophy; 2+ right radial pulse Skin: warm and dry, no rash Neuro:  Strength and sensation are intact Psych: euthymic mood, full affect    Recent Labs: 01/06/2020: ALT 41; TSH 5.140 10/07/2020: BUN 12; Creatinine, Ser 0.86; Hemoglobin 12.5; Platelets 237; Potassium 4.7; Sodium 135   Lipid Panel    Component Value Date/Time   CHOL 126 01/06/2020 0835   CHOL 127 10/28/2014 0801   TRIG 149 01/06/2020 0835   TRIG 192 (H) 10/28/2014 0801   HDL 39 (L) 01/06/2020 0835   HDL 30 (L) 10/28/2014 0801   CHOLHDL 3.2 01/06/2020 0835   CHOLHDL 3.8 08/08/2016 0731   VLDL 22 08/08/2016 0731   LDLCALC 61 01/06/2020 0835   LDLCALC 59 10/28/2014 0801     Other studies Reviewed: Additional studies/ records that were reviewed today with results demonstrating: labs reviewed , A1C 5.9, LDL 61   ASSESSMENT AND PLAN:  1.   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  No CHF.  Angina much improved after PTCA of two distal branches.  Stents could not be delivered.  OK to take a SL NTG before walking.  She has increased walking gradually to 15 minutes.  No severe angina like what she had before her large vessel disease.  If sx are manageable with meds, would continue medical therapy.  If she has refractory symptoms, would have to reconsider cath from the femoral approach and repeat attempt at stent placement.  We discussed that intervention at this point would be purely for symptom management and not for decreasing mortality. 2.   DM: The current medical regimen is effective;  continue present plan and medications.  A1C 5.9. 3.   Hyperlipidemia: The current medical regimen is effective;  continue present plan and medications. 4.   Hypertensive heart disease: The current medical regimen  is effective;  continue present plan and medications.   Current medicines are  reviewed at length with the patient today.  The patient concerns regarding her medicines were addressed.  The following changes have been made:  No change  Labs/ tests ordered today include:  No orders of the defined types were placed in this encounter.   Recommend 150 minutes/week of aerobic exercise Low fat, low carb, high fiber diet recommended  Disposition:   FU in 4 months   Signed, Lance Muss, MD  10/21/2020 2:54 PM    [facility] [address] [place], [place], [place]  [address] Phone: [phone]; Fax: [phone]

## 2020-10-21 ENCOUNTER — Ambulatory Visit: Payer: Medicare PPO | Admitting: Interventional Cardiology

## 2020-10-21 ENCOUNTER — Encounter: Payer: Self-pay | Admitting: Interventional Cardiology

## 2020-10-21 ENCOUNTER — Other Ambulatory Visit: Payer: Self-pay

## 2020-10-21 VITALS — BP 110/62 | HR 78 | Ht 62.0 in | Wt 178.2 lb

## 2020-10-21 DIAGNOSIS — I119 Hypertensive heart disease without heart failure: Secondary | ICD-10-CM

## 2020-10-21 DIAGNOSIS — E782 Mixed hyperlipidemia: Secondary | ICD-10-CM | POA: Diagnosis not present

## 2020-10-21 DIAGNOSIS — E1159 Type 2 diabetes mellitus with other circulatory complications: Secondary | ICD-10-CM

## 2020-10-21 DIAGNOSIS — I25118 Atherosclerotic heart disease of native coronary artery with other forms of angina pectoris: Secondary | ICD-10-CM

## 2020-10-21 DIAGNOSIS — I252 Old myocardial infarction: Secondary | ICD-10-CM | POA: Diagnosis not present

## 2020-10-21 NOTE — Patient Instructions (Signed)
Medication Instructions:  Your provider recommends that you continue on your current medications as directed. Please refer to the Current Medication list given to you today.  *If you need a refill on your cardiac medications before your next appointment, please call your pharmacy*  Follow-Up: At [facility], you and your health needs are our priority.  As part of our continuing mission to provide you with exceptional heart care, we have created designated Provider Care Teams.  These Care Teams include your primary Cardiologist (physician) and Advanced Practice Providers (APPs -  Physician Assistants and Nurse Practitioners) who all work together to provide you with the care you need, when you need it. Your next appointment:   4 month(s) The format for your next appointment:   In Person Provider:   You may see Larae Grooms, MD or one of the following Advanced Practice Providers on your designated Care Team:    Melina Copa, PA-C  Ermalinda Barrios, PA-C

## 2020-10-26 ENCOUNTER — Telehealth (HOSPITAL_COMMUNITY): Payer: Self-pay | Admitting: *Deleted

## 2020-10-26 NOTE — Telephone Encounter (Signed)
-----   Message from Corky Crafts, MD sent at 10/25/2020  7:23 PM EST ----- I am fine with rehab.  If she has angina, she can slow down or stop.  If the angina goes away, fine.  If it persists or does not resolve after 3 SL NTG, that is when ER would need to be considered.  JV ----- Message ----- From: Chelsea Aus, RN Sent: 10/25/2020   3:54 PM EST To: Corky Crafts, MD   Dr. Eldridge Dace,  The above pt seen by you in follow up on 12/22 s/p PTCA x 2 on 12/7 with residual disease.  Pt reports chest tightness on exertion although much improved.  Advised to take NTG prior to walking.  This pt is interested in participating in cardiac rehab. What would your recommendations specific for her anginal management at cardiac rehab? Ie an acceptable level of chest discomfort, when to get 12 lead ekg/transfer to ER.  Thank you for your advisement.  Alanson Aly, BSN Cardiac and Emergency planning/management officer

## 2020-11-17 ENCOUNTER — Telehealth (HOSPITAL_COMMUNITY): Payer: Self-pay | Admitting: *Deleted

## 2020-11-17 NOTE — Telephone Encounter (Signed)
Contacted patient to schedule cardiac. Patient has decided not to participate in the program per orders patient, she takes long walks and has discussed with Dr. Briant Sites fine with that. Patient aslo states husband is receiving raditaion therapy and she wants to limit her e

## 2020-11-17 NOTE — Telephone Encounter (Signed)
Contacted patient to schedule cardiac rehab. Patient has decided not to participate in the program at this time. Per patient she takes long walks and has discussed this with Dr. Irish Lack who is fine with that. Patient states that her husband is receiving radiation therapy, and she wants to limit her outside exposure for him. Also, she has not participated in CR after her previous cardiac interventions. Will close referral at this time.  Sol Passer, MS, ACSM CEP 11/17/2020 [phone]

## 2020-12-17 ENCOUNTER — Other Ambulatory Visit: Payer: Self-pay | Admitting: Interventional Cardiology

## 2020-12-31 ENCOUNTER — Encounter: Payer: Self-pay | Admitting: Registered Nurse

## 2020-12-31 ENCOUNTER — Other Ambulatory Visit: Payer: Self-pay | Admitting: *Deleted

## 2020-12-31 ENCOUNTER — Other Ambulatory Visit: Payer: Self-pay | Admitting: Registered Nurse

## 2020-12-31 DIAGNOSIS — E1159 Type 2 diabetes mellitus with other circulatory complications: Secondary | ICD-10-CM

## 2020-12-31 MED ORDER — JANUMET XR 50-1000 MG PO TB24: 2.0000 | ORAL_TABLET | Freq: Every morning | ORAL | 1 refills | Status: DC

## 2021-01-01 ENCOUNTER — Other Ambulatory Visit: Payer: Self-pay

## 2021-01-01 MED ORDER — TICAGRELOR 90 MG PO TABS: 90.0000 mg | ORAL_TABLET | Freq: Two times a day (BID) | ORAL | 2 refills | Status: DC

## 2021-01-12 DIAGNOSIS — I209 Angina pectoris, unspecified: Secondary | ICD-10-CM

## 2021-01-12 DIAGNOSIS — I25118 Atherosclerotic heart disease of native coronary artery with other forms of angina pectoris: Secondary | ICD-10-CM

## 2021-01-12 DIAGNOSIS — Z01812 Encounter for preprocedural laboratory examination: Secondary | ICD-10-CM

## 2021-01-12 NOTE — Telephone Encounter (Signed)
I spoke with patient and reviewed cath instructions with her.  Instructions sent to patient through my chart

## 2021-01-12 NOTE — Telephone Encounter (Signed)
I would set her up for cardiac cath, maybe Friday?. Thanks.  If her visit is out of the 30 day window, I can do an H&P in the hospital.  JV   I spoke with patient and she would like to proceed with cath on Friday. Cath scheduled for 01/15/21 at noon.

## 2021-01-13 ENCOUNTER — Other Ambulatory Visit: Payer: Medicare PPO | Admitting: *Deleted

## 2021-01-13 ENCOUNTER — Other Ambulatory Visit (HOSPITAL_COMMUNITY)
Admission: RE | Admit: 2021-01-13 | Discharge: 2021-01-13 | Disposition: A | Discharge status: Home / Self Care | Payer: Medicare PPO | Source: Ambulatory Visit | Attending: Interventional Cardiology | Admitting: Interventional Cardiology

## 2021-01-13 ENCOUNTER — Other Ambulatory Visit: Payer: Self-pay

## 2021-01-13 DIAGNOSIS — I209 Angina pectoris, unspecified: Secondary | ICD-10-CM

## 2021-01-13 DIAGNOSIS — Z01812 Encounter for preprocedural laboratory examination: Secondary | ICD-10-CM

## 2021-01-13 DIAGNOSIS — I25118 Atherosclerotic heart disease of native coronary artery with other forms of angina pectoris: Secondary | ICD-10-CM

## 2021-01-13 DIAGNOSIS — Z20822 Contact with and (suspected) exposure to covid-19: Secondary | ICD-10-CM | POA: Diagnosis not present

## 2021-01-13 LAB — CBC
Hematocrit: 37.6 % (ref 34.0–46.6)
Hemoglobin: 12.6 g/dL (ref 11.1–15.9)
MCH: 30.7 pg (ref 26.6–33.0)
MCHC: 33.5 g/dL (ref 31.5–35.7)
MCV: 92 fL (ref 79–97)
Platelets: 267 10*3/uL (ref 150–450)
RBC: 4.1 x10E6/uL (ref 3.77–5.28)
RDW: 14.1 % (ref 11.7–15.4)
WBC: 8.3 10*3/uL (ref 3.4–10.8)

## 2021-01-13 LAB — BASIC METABOLIC PANEL
BUN/Creatinine Ratio: 22 (ref 12–28)
BUN: 19 mg/dL (ref 8–27)
CO2: 23 mmol/L (ref 20–29)
Calcium: 9.9 mg/dL (ref 8.7–10.3)
Chloride: 106 mmol/L (ref 96–106)
Creatinine, Ser: 0.86 mg/dL (ref 0.57–1.00)
Glucose: 154 mg/dL — ABNORMAL HIGH (ref 65–99)
Potassium: 4.6 mmol/L (ref 3.5–5.2)
Sodium: 140 mmol/L (ref 134–144)
eGFR: 74 mL/min/{1.73_m2} (ref >59)

## 2021-01-13 LAB — SARS CORONAVIRUS 2 (TAT 6-24 HRS): SARS Coronavirus 2: NEGATIVE

## 2021-01-14 ENCOUNTER — Telehealth: Payer: Self-pay | Admitting: *Deleted

## 2021-01-14 NOTE — Telephone Encounter (Signed)
Pt contacted pre-catheterization scheduled at [facility] for: Friday January 15, 2021 12 Noon Verified arrival time and place: [facility]) at: 10 AM   No solid food after midnight prior to cath, clear liquids until 5 AM day of procedure.  Hold: Janumet-day of procedure and 48 hours post procedure  Except hold medications AM meds can be  taken pre-cath with sips of water including: ASA 81 mg Brilinta 90 mg  Confirmed patient has responsible adult to drive home post procedure and be with patient first 24 hours after arriving home: yes  You are allowed ONE visitor in the waiting room during the time you are at the hospital for your procedure. Both you and your visitor must wear a mask once you enter the hospital.   Reviewed procedure/mask/visitor instructions with patient.

## 2021-01-15 ENCOUNTER — Ambulatory Visit (HOSPITAL_COMMUNITY)
Admission: RE | Admit: 2021-01-15 | Discharge: 2021-01-16 | Disposition: A | Discharge status: Home / Self Care | Payer: Medicare PPO | Attending: Interventional Cardiology | Admitting: Interventional Cardiology

## 2021-01-15 ENCOUNTER — Encounter (HOSPITAL_COMMUNITY): Payer: Self-pay | Admitting: Interventional Cardiology

## 2021-01-15 ENCOUNTER — Encounter (HOSPITAL_COMMUNITY)
Admission: RE | Disposition: A | Discharge status: Home / Self Care | Payer: Self-pay | Source: Home / Self Care | Attending: Interventional Cardiology

## 2021-01-15 ENCOUNTER — Other Ambulatory Visit: Payer: Self-pay

## 2021-01-15 DIAGNOSIS — Z79899 Other long term (current) drug therapy: Secondary | ICD-10-CM | POA: Diagnosis not present

## 2021-01-15 DIAGNOSIS — I2510 Atherosclerotic heart disease of native coronary artery without angina pectoris: Secondary | ICD-10-CM | POA: Diagnosis present

## 2021-01-15 DIAGNOSIS — Z9851 Tubal ligation status: Secondary | ICD-10-CM | POA: Diagnosis not present

## 2021-01-15 DIAGNOSIS — Z8541 Personal history of malignant neoplasm of cervix uteri: Secondary | ICD-10-CM | POA: Diagnosis not present

## 2021-01-15 DIAGNOSIS — Z955 Presence of coronary angioplasty implant and graft: Secondary | ICD-10-CM | POA: Diagnosis not present

## 2021-01-15 DIAGNOSIS — E785 Hyperlipidemia, unspecified: Secondary | ICD-10-CM | POA: Insufficient documentation

## 2021-01-15 DIAGNOSIS — Z888 Allergy status to other drugs, medicaments and biological substances status: Secondary | ICD-10-CM | POA: Diagnosis not present

## 2021-01-15 DIAGNOSIS — Z7982 Long term (current) use of aspirin: Secondary | ICD-10-CM | POA: Diagnosis not present

## 2021-01-15 DIAGNOSIS — E669 Obesity, unspecified: Secondary | ICD-10-CM | POA: Insufficient documentation

## 2021-01-15 DIAGNOSIS — I10 Essential (primary) hypertension: Secondary | ICD-10-CM | POA: Diagnosis not present

## 2021-01-15 DIAGNOSIS — I25118 Atherosclerotic heart disease of native coronary artery with other forms of angina pectoris: Secondary | ICD-10-CM | POA: Insufficient documentation

## 2021-01-15 DIAGNOSIS — Z85828 Personal history of other malignant neoplasm of skin: Secondary | ICD-10-CM | POA: Insufficient documentation

## 2021-01-15 DIAGNOSIS — I209 Angina pectoris, unspecified: Secondary | ICD-10-CM

## 2021-01-15 DIAGNOSIS — I252 Old myocardial infarction: Secondary | ICD-10-CM | POA: Diagnosis not present

## 2021-01-15 DIAGNOSIS — E119 Type 2 diabetes mellitus without complications: Secondary | ICD-10-CM | POA: Diagnosis not present

## 2021-01-15 DIAGNOSIS — I2584 Coronary atherosclerosis due to calcified coronary lesion: Secondary | ICD-10-CM | POA: Insufficient documentation

## 2021-01-15 DIAGNOSIS — Z6831 Body mass index (BMI) 31.0-31.9, adult: Secondary | ICD-10-CM | POA: Insufficient documentation

## 2021-01-15 DIAGNOSIS — Z87891 Personal history of nicotine dependence: Secondary | ICD-10-CM | POA: Insufficient documentation

## 2021-01-15 HISTORY — PX: LEFT HEART CATH AND CORONARY ANGIOGRAPHY: CATH118249

## 2021-01-15 HISTORY — PX: CORONARY STENT INTERVENTION: CATH118234

## 2021-01-15 LAB — GLUCOSE, CAPILLARY
Glucose-Capillary: 215 mg/dL — ABNORMAL HIGH (ref 70–99)
Glucose-Capillary: 125 mg/dL — ABNORMAL HIGH (ref 70–99)
Glucose-Capillary: 194 mg/dL — ABNORMAL HIGH (ref 70–99)

## 2021-01-15 LAB — POCT ACTIVATED CLOTTING TIME
Activated Clotting Time: 297 seconds
Activated Clotting Time: 309 seconds
Activated Clotting Time: 214 seconds
Activated Clotting Time: 184 seconds

## 2021-01-15 LAB — HEMOGLOBIN A1C
Hgb A1c MFr Bld: 6.6 % — ABNORMAL HIGH (ref 4.8–5.6)
Mean Plasma Glucose: 142.72 mg/dL

## 2021-01-15 SURGERY — LEFT HEART CATH AND CORONARY ANGIOGRAPHY
Anesthesia: LOCAL

## 2021-01-15 MED ORDER — ASPIRIN 81 MG PO CHEW: 81.0000 mg | CHEWABLE_TABLET | ORAL | Status: DC

## 2021-01-15 MED ORDER — HEPARIN (PORCINE) IN NACL 1000-0.9 UT/500ML-% IV SOLN
INTRAVENOUS | Status: DC | PRN
  Administered 2021-01-15 (×2): 500 mL

## 2021-01-15 MED ORDER — ACETAMINOPHEN 325 MG PO TABS: 650.0000 mg | ORAL_TABLET | ORAL | Status: DC | PRN

## 2021-01-15 MED ORDER — ONDANSETRON HCL 4 MG/2ML IJ SOLN: 4.0000 mg | Freq: Four times a day (QID) | INTRAMUSCULAR | Status: DC | PRN

## 2021-01-15 MED ORDER — HEPARIN SODIUM (PORCINE) 1000 UNIT/ML IJ SOLN
INTRAMUSCULAR | Status: AC
  Filled 2021-01-15: qty 1

## 2021-01-15 MED ORDER — SODIUM CHLORIDE 0.9% FLUSH: 3.0000 mL | Freq: Two times a day (BID) | INTRAVENOUS | Status: DC

## 2021-01-15 MED ORDER — TICAGRELOR 90 MG PO TABS
90.0000 mg | ORAL_TABLET | Freq: Two times a day (BID) | ORAL | Status: DC
  Administered 2021-01-15 – 2021-01-16 (×2): 90 mg via ORAL
  Filled 2021-01-15 (×2): qty 1

## 2021-01-15 MED ORDER — SODIUM CHLORIDE 0.9 % IV SOLN: INTRAVENOUS | Status: AC

## 2021-01-15 MED ORDER — SODIUM CHLORIDE 0.9% FLUSH: 3.0000 mL | INTRAVENOUS | Status: DC | PRN

## 2021-01-15 MED ORDER — NITROGLYCERIN 0.4 MG SL SUBL: 0.4000 mg | SUBLINGUAL_TABLET | SUBLINGUAL | Status: DC | PRN

## 2021-01-15 MED ORDER — ASPIRIN 81 MG PO CHEW
81.0000 mg | CHEWABLE_TABLET | Freq: Every day | ORAL | Status: DC
  Administered 2021-01-16: 81 mg via ORAL
  Filled 2021-01-15: qty 1

## 2021-01-15 MED ORDER — EZETIMIBE 10 MG PO TABS
10.0000 mg | ORAL_TABLET | Freq: Every day | ORAL | Status: DC
  Administered 2021-01-15: 10 mg via ORAL
  Filled 2021-01-15: qty 1

## 2021-01-15 MED ORDER — TICAGRELOR 90 MG PO TABS: 90.0000 mg | ORAL_TABLET | Freq: Two times a day (BID) | ORAL | Status: DC

## 2021-01-15 MED ORDER — MIDAZOLAM HCL 2 MG/2ML IJ SOLN
INTRAMUSCULAR | Status: AC
  Filled 2021-01-15: qty 2

## 2021-01-15 MED ORDER — ISOSORBIDE MONONITRATE ER 60 MG PO TB24
60.0000 mg | ORAL_TABLET | Freq: Every day | ORAL | Status: DC
Start: 2021-01-16 — End: 2021-01-16
  Administered 2021-01-16: 60 mg via ORAL
  Filled 2021-01-15: qty 1

## 2021-01-15 MED ORDER — HYDRALAZINE HCL 20 MG/ML IJ SOLN: 10.0000 mg | INTRAMUSCULAR | Status: AC | PRN

## 2021-01-15 MED ORDER — SODIUM CHLORIDE 0.9 % WEIGHT BASED INFUSION
3.0000 mL/kg/h | INTRAVENOUS | Status: AC
  Administered 2021-01-15: 3 mL/kg/h via INTRAVENOUS

## 2021-01-15 MED ORDER — SODIUM CHLORIDE 0.9 % IV SOLN: 250.0000 mL | INTRAVENOUS | Status: DC | PRN

## 2021-01-15 MED ORDER — SODIUM CHLORIDE 0.9 % WEIGHT BASED INFUSION: 1.0000 mL/kg/h | INTRAVENOUS | Status: DC

## 2021-01-15 MED ORDER — MIDAZOLAM HCL 2 MG/2ML IJ SOLN
INTRAMUSCULAR | Status: DC | PRN
  Administered 2021-01-15: 2 mg via INTRAVENOUS
  Administered 2021-01-15 (×2): 1 mg via INTRAVENOUS

## 2021-01-15 MED ORDER — IOHEXOL 350 MG/ML SOLN
INTRAVENOUS | Status: AC
  Filled 2021-01-15: qty 1

## 2021-01-15 MED ORDER — HEPARIN SODIUM (PORCINE) 1000 UNIT/ML IJ SOLN
INTRAMUSCULAR | Status: DC | PRN
  Administered 2021-01-15: 9000 [IU] via INTRAVENOUS
  Administered 2021-01-15: 2000 [IU] via INTRAVENOUS

## 2021-01-15 MED ORDER — SODIUM CHLORIDE 0.9% FLUSH
3.0000 mL | Freq: Two times a day (BID) | INTRAVENOUS | Status: DC
  Administered 2021-01-15: 3 mL via INTRAVENOUS

## 2021-01-15 MED ORDER — LIDOCAINE HCL (PF) 1 % IJ SOLN
INTRAMUSCULAR | Status: AC
  Filled 2021-01-15: qty 30

## 2021-01-15 MED ORDER — INSULIN ASPART 100 UNIT/ML ~~LOC~~ SOLN
0.0000 [IU] | Freq: Three times a day (TID) | SUBCUTANEOUS | Status: DC
  Administered 2021-01-15: 2 [IU] via SUBCUTANEOUS
  Administered 2021-01-16: 5 [IU] via SUBCUTANEOUS
  Administered 2021-01-16: 3 [IU] via SUBCUTANEOUS

## 2021-01-15 MED ORDER — IOHEXOL 350 MG/ML SOLN
INTRAVENOUS | Status: DC | PRN
  Administered 2021-01-15: 95 mL

## 2021-01-15 MED ORDER — NITROGLYCERIN 1 MG/10 ML FOR IR/CATH LAB
INTRA_ARTERIAL | Status: AC
  Filled 2021-01-15: qty 10

## 2021-01-15 MED ORDER — LABETALOL HCL 5 MG/ML IV SOLN: 10.0000 mg | INTRAVENOUS | Status: AC | PRN

## 2021-01-15 MED ORDER — ASPIRIN 81 MG PO CHEW: 81.0000 mg | CHEWABLE_TABLET | Freq: Every day | ORAL | Status: DC

## 2021-01-15 MED ORDER — FENTANYL CITRATE (PF) 100 MCG/2ML IJ SOLN
INTRAMUSCULAR | Status: DC | PRN
  Administered 2021-01-15 (×3): 25 ug via INTRAVENOUS

## 2021-01-15 MED ORDER — SODIUM CHLORIDE 0.9 % IV SOLN
250.0000 mL | INTRAVENOUS | Status: DC | PRN
Start: 2021-01-15 — End: 2021-01-15

## 2021-01-15 MED ORDER — NITROGLYCERIN 1 MG/10 ML FOR IR/CATH LAB
INTRA_ARTERIAL | Status: DC | PRN
  Administered 2021-01-15: 200 ug via INTRACORONARY

## 2021-01-15 MED ORDER — METOPROLOL SUCCINATE ER 25 MG PO TB24
25.0000 mg | ORAL_TABLET | Freq: Every day | ORAL | Status: DC
  Administered 2021-01-16: 25 mg via ORAL
  Filled 2021-01-15: qty 1

## 2021-01-15 MED ORDER — TICAGRELOR 90 MG PO TABS: 90.0000 mg | ORAL_TABLET | ORAL | Status: DC

## 2021-01-15 MED ORDER — PANTOPRAZOLE SODIUM 40 MG PO TBEC
40.0000 mg | DELAYED_RELEASE_TABLET | Freq: Every day | ORAL | Status: DC
  Filled 2021-01-15: qty 1

## 2021-01-15 MED ORDER — LIDOCAINE HCL (PF) 1 % IJ SOLN
INTRAMUSCULAR | Status: DC | PRN
  Administered 2021-01-15: 12 mL

## 2021-01-15 MED ORDER — HEPARIN (PORCINE) IN NACL 1000-0.9 UT/500ML-% IV SOLN
INTRAVENOUS | Status: AC
  Filled 2021-01-15: qty 1000

## 2021-01-15 MED ORDER — ATORVASTATIN CALCIUM 80 MG PO TABS
80.0000 mg | ORAL_TABLET | Freq: Every day | ORAL | Status: DC
  Administered 2021-01-16: 80 mg via ORAL
  Filled 2021-01-15: qty 1

## 2021-01-15 MED ORDER — FENTANYL CITRATE (PF) 100 MCG/2ML IJ SOLN
INTRAMUSCULAR | Status: AC
  Filled 2021-01-15: qty 2

## 2021-01-15 MED ORDER — AMLODIPINE BESYLATE 5 MG PO TABS
5.0000 mg | ORAL_TABLET | Freq: Every day | ORAL | Status: DC
  Administered 2021-01-16: 5 mg via ORAL
  Filled 2021-01-15: qty 1

## 2021-01-15 MED ORDER — ADULT MULTIVITAMIN W/MINERALS CH
1.0000 | ORAL_TABLET | Freq: Every day | ORAL | Status: DC
  Administered 2021-01-16: 1 via ORAL
  Filled 2021-01-15: qty 1

## 2021-01-15 MED ORDER — DIPHENHYDRAMINE HCL 25 MG PO CAPS: 50.0000 mg | ORAL_CAPSULE | Freq: Every day | ORAL | Status: DC

## 2021-01-15 SURGICAL SUPPLY — 18 items
BALLN SAPPHIRE 2.0X15 (BALLOONS) ×2
BALLN SAPPHIRE ~~LOC~~ 2.5X15 (BALLOONS) ×2 IMPLANT
BALLOON SAPPHIRE 2.0X15 (BALLOONS) ×1 IMPLANT
CATH INFINITI 5FR JL5 (CATHETERS) ×2 IMPLANT
CATH INFINITI 5FR MULTPACK ANG (CATHETERS) ×2 IMPLANT
CATH LAUNCHER 6FR EBU 4 (CATHETERS) ×2 IMPLANT
KIT ENCORE 26 ADVANTAGE (KITS) ×2 IMPLANT
KIT HEART LEFT (KITS) ×2 IMPLANT
PACK CARDIAC CATHETERIZATION (CUSTOM PROCEDURE TRAY) ×2 IMPLANT
SHEATH PINNACLE 5F 10CM (SHEATH) ×2 IMPLANT
SHEATH PINNACLE 6F 10CM (SHEATH) ×2 IMPLANT
SHEATH PROBE COVER 6X72 (BAG) ×2 IMPLANT
STENT RESOLUTE ONYX 2.25X34 (Permanent Stent) ×2 IMPLANT
TRANSDUCER W/STOPCOCK (MISCELLANEOUS) ×2 IMPLANT
TUBING CIL FLEX 10 FLL-RA (TUBING) ×2 IMPLANT
WIRE ASAHI GRAND SLAM 180CM (WIRE) ×2 IMPLANT
WIRE ASAHI PROWATER 180CM (WIRE) ×2 IMPLANT
WIRE EMERALD 3MM-J .035X150CM (WIRE) ×2 IMPLANT

## 2021-01-15 NOTE — Progress Notes (Signed)
Site area: Scientific laboratory technician Prior to Removal:  Level 0 Pressure Applied For: 40min Manual:   Yes Patient Status During Pull:  A/O Post Pull Site:  Level 0 Post Pull Instructions Given:  Post instructions given and pt understands. Post Pull Pulses Present: 2+ rt dp Dressing Applied:  Tegaderm and a 4x4 Bedrest begins @ 18:00:00 Comments: Rt groin is unremarkable. Dressing is CDI. No hematoma or complications. Bp is 111/58 Hr 72 Brooke-RN has visualized rt groin and agrees Level 0.

## 2021-01-15 NOTE — Interval H&P Note (Signed)
Cath Lab Visit (complete for each Cath Lab visit)  Clinical Evaluation Leading to the Procedure:   ACS: No.  Non-ACS:    Anginal Classification: CCS III  Anti-ischemic medical therapy: Minimal Therapy (1 class of medications)  Non-Invasive Test Results: No non-invasive testing performed  Prior CABG: No previous CABG   Femoral approach  History and Physical Interval Note:  01/15/2021 12:56 PM  Suzanne Watkins  has presented today for surgery, with the diagnosis of chest pain - cad.  The various methods of treatment have been discussed with the patient and family. After consideration of risks, benefits and other options for treatment, the patient has consented to  Procedure(s): LEFT HEART CATH AND CORONARY ANGIOGRAPHY (N/A) as a surgical intervention.  The patient's history has been reviewed, patient examined, no change in status, stable for surgery.  I have reviewed the patient's chart and labs.  Questions were answered to the patient's satisfaction.     Larae Grooms

## 2021-01-15 NOTE — H&P (Signed)
Cardiology Admission History and Physical:   Patient ID: Suzanne Watkins MRN: [id number]; DOB: 18-Mar-1951   Admission date: 01/15/2021  PCP:  Maximiano Coss, NP   [place]  Cardiologist:  Larae Grooms, MD  Advanced Practice Provider:  No care team member to display Electrophysiologist:  None    :[phone]}    Chief Complaint:  Chest pain  Patient Profile:   Suzanne Watkins is a 70 y.o. female with known CAD.  History of Present Illness:   Suzanne Watkins who had PTCA in 12/21.  Stents could not be delivered from radial approach.  SHe did well for a few months but is having recurrent typical angina.  Plan was for femoral cath if sx returned.    Past Medical History:  Diagnosis Date  . Anginal pain ([place])   . Arthritis    "probably in my hands" (09/04/2013)  . 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 Cancer ([place])    Phreesia 05/17/2020  . Cervical cancer ([place]) 1986  . Coronary atherosclerosis of native coronary artery 09/04/2013   RCA and obtuse marginal stent/DES-2010  . Diabetes mellitus without complication ([place])    Phreesia 05/17/2020  . Fibromyalgia    "dx'd in 1994"  . Full dentures   . GERD (gastroesophageal reflux disease)   . Hyperlipidemia   . Hypertension   . Myocardial infarction ([place])    Phreesia 05/17/2020  . Obesity   . Shortness of breath    "just related to angina I was having recently" (09/04/2013)  . Type II diabetes mellitus ([place])    followed by pcp  . Wears glasses     Past Surgical History:  Procedure Laterality Date  . CARPAL TUNNEL RELEASE Right 07/1992  . CERVICAL CONE BIOPSY  03/1985  . CORONARY ANGIOPLASTY WITH STENT PLACEMENT  07/2009, 08/2009; 09/04/2013   "1+1+2; total of 4" (09/04/2013)  . CORONARY BALLOON ANGIOPLASTY N/A 10/06/2020   Procedure: CORONARY BALLOON ANGIOPLASTY;  Surgeon: Jettie Booze, MD;  Location: [facility];  Service: Cardiovascular;  Laterality: N/A;  . FRACTURE SURGERY  N/A    Phreesia 05/17/2020  . LEFT HEART CATH AND CORONARY ANGIOGRAPHY N/A 10/06/2020   Procedure: LEFT HEART CATH AND CORONARY ANGIOGRAPHY;  Surgeon: Jettie Booze, MD;  Location: [facility];  Service: Cardiovascular;  Laterality: N/A;  . LEFT HEART CATHETERIZATION WITH CORONARY ANGIOGRAM N/A 09/04/2013   Procedure: LEFT HEART CATHETERIZATION WITH CORONARY ANGIOGRAM;  Surgeon: Candee Furbish, MD;  Location: [facility] CATH LAB;  Service: Cardiovascular;  Laterality: N/A;  . MOHS SURGERY Left 05/2008   "nostril"  . ORIF ANKLE FRACTURE Right 10/10/2018   Procedure: OPEN REDUCTION INTERNAL FIXATION (ORIF) ANKLE FRACTURE;  Surgeon: Rod Can, MD;  Location: [facility] ORS;  Service: Orthopedics;  Laterality: Right;  . SHOULDER OPEN ROTATOR CUFF REPAIR Right 10/2004   "got 5 pins in" (09/04/2013)  . TUBAL LIGATION Bilateral 1979     Medications Prior to Admission: Prior to Admission medications   Medication Sig Start Date End Date Taking? Authorizing Provider  amLODipine (NORVASC) 5 MG tablet Take 1 tablet (5 mg total) by mouth in the morning and at bedtime. 08/26/20  Yes Jettie Booze, MD  aspirin 81 MG chewable tablet Chew 1 tablet (81 mg total) by mouth daily. 10/07/20  Yes Cheryln Manly, NP  atorvastatin (LIPITOR) 80 MG tablet TAKE 1 TABLET(80 MG) BY MOUTH DAILY AT 6 PM Patient taking differently: Take 80 mg by mouth daily. 09/23/20  Yes Jettie Booze, MD  diphenhydrAMINE (BENADRYL) 50 MG capsule Take 50 mg by mouth at bedtime.   Yes [provider]  ezetimibe (ZETIA) 10 MG tablet TAKE 1 TABLET(10 MG) BY MOUTH DAILY Patient taking differently: Take 10 mg by mouth at bedtime. 09/23/20  Yes Jettie Booze, MD  isosorbide mononitrate (IMDUR) 60 MG 24 hr tablet Take 1 tablet (60 mg total) by mouth daily. 10/01/20  Yes Jettie Booze, MD  metoprolol succinate (TOPROL-XL) 25 MG 24 hr tablet TAKE 1 TABLET(25 MG) BY MOUTH DAILY Patient taking differently: Take 25  mg by mouth daily. 12/17/20  Yes Jettie Booze, MD  Multiple Vitamin (MULTIVITAMIN WITH MINERALS) TABS tablet Take 1 tablet by mouth daily. Centrum Silver for Women   Yes [provider]  nitroGLYCERIN (NITROSTAT) 0.4 MG SL tablet Place 1 tablet (0.4 mg total) under the tongue every 5 (five) minutes as needed for chest pain. 09/02/20  Yes Jettie Booze, MD  omeprazole (PRILOSEC) 20 MG capsule Take 20 mg by mouth daily before breakfast.    Yes [provider]  SitaGLIPtin-MetFORMIN HCl (JANUMET XR) 50-1000 MG TB24 Take 2 tablets by mouth every morning. 12/31/20  Yes Maximiano Coss, NP  ticagrelor (BRILINTA) 90 MG TABS tablet Take 1 tablet (90 mg total) by mouth 2 (two) times daily. 01/01/21  Yes Jettie Booze, MD  valsartan (DIOVAN) 320 MG tablet TAKE 1 TABLET(320 MG) BY MOUTH DAILY Patient taking differently: Take 320 mg by mouth daily. 09/23/20  Yes Jettie Booze, MD  glucose blood (ACCU-CHEK GUIDE) test strip Use as instructed 07/10/20   Maximiano Coss, NP     Allergies:    Allergies  Allergen Reactions  . Plavix [Clopidogrel Bisulfate] Other (See Comments)    Stomach upset  . Adhesive [Tape] Rash    Site latex allergy    Social History:   Social History   Socioeconomic History  . Marital status: Married    Spouse name: Not on file  . Number of children: Not on file  . Years of education: Not on file  . Highest education level: Not on file  Occupational History  . Not on file  Tobacco Use  . Smoking status: Former Smoker    Packs/day: 1.00    Years: 29.00    Pack years: 29.00    Types: Cigarettes    Quit date: 06/14/2006    Years since quitting: 14.6  . Smokeless tobacco: Never Used  Vaping Use  . Vaping Use: Never used  Substance and Sexual Activity  . Alcohol use: No  . Drug use: No  . Sexual activity: Yes    Birth control/protection: None  Other Topics Concern  . Not on file  Social History Narrative   Marital status:  married      Children: grown children; 9 grandchildren; 1 gg      Lives: with husband, 2 birds/[employer]      Employment:  [employer] x 18 years; billing      Tobacco: quit smoking 05/2006      Alcohol:  None     Exercise: walking 30 minutes per day   Social Determinants of Health   Financial Resource Strain: Not on file  Food Insecurity: Not on file  Transportation Needs: Not on file  Physical Activity: Not on file  Stress: Not on file  Social Connections: Not on file  Intimate Partner Violence: Not on file    Family History:   The patient's  family history includes Cancer in her father; Diabetes in her daughter; Drug abuse in her brother and daughter; Heart disease in her daughter and paternal grandmother; Heart disease (age of onset: 77) in her mother; Hyperlipidemia in her brother and mother; Hypertension in her brother; Lupus in her daughter; Stroke in her paternal grandfather.    ROS:  Please see the history of present illness.  All other ROS reviewed and negative.     Physical Exam/Data:   Vitals:   01/15/21 1052  BP: (!) 114/57  Pulse: 83  Resp: 18  SpO2: 99%  Weight: 80.6 kg  Height: 5' 2.5" (1.588 m)   No intake or output data in the 24 hours ending 01/15/21 1127 Last 3 Weights 01/15/2021 10/21/2020 10/06/2020  Weight (lbs) 177 lb 9.6 oz 178 lb 3.2 oz 178 lb 6.4 oz  Weight (kg) 80.559 kg 80.831 kg 80.922 kg     Body mass index is 31.97 kg/m.  General:  Well nourished, well developed, in no acute distress HEENT: normal Lymph: no adenopathy Neck: no JVD Endocrine:  No thryomegaly Vascular: No carotid bruits; FA pulses 2+ bilaterally without bruits  Cardiac:  normal S1, S2; RRR; no murmur  Lungs:  clear to auscultation bilaterally, no wheezing, rhonchi or rales  Abd: soft, nontender, no hepatomegaly  Ext: no edema Musculoskeletal:  No deformities, BUE and BLE strength normal and equal Skin: warm and dry  Neuro:  CNs 2-12 intact, no focal abnormalities  noted Psych:  Normal affect      Laboratory Data:  High Sensitivity Troponin:  No results for input(s): TROPONINIHS in the last 720 hours.    Chemistry Recent Labs  Lab 01/13/21 1202  NA 140  K 4.6  CL 106  CO2 23  GLUCOSE 154*  BUN 19  CREATININE 0.86  CALCIUM 9.9    No results for input(s): PROT, ALBUMIN, AST, ALT, ALKPHOS, BILITOT in the last 168 hours. Hematology Recent Labs  Lab 01/13/21 1202  WBC 8.3  RBC 4.10  HGB 12.6  HCT 37.6  MCV 92  MCH 30.7  MCHC 33.5  RDW 14.1  PLT 267   BNPNo results for input(s): BNP, PROBNP in the last 168 hours.  DDimer No results for input(s): DDIMER in the last 168 hours.   Radiology/Studies:  No results found.   Assessment and Plan:   1. Plan for cath via femoral approach for CAD with angina.    Risk Assessment/Risk Scores:       For questions or updates, please contact [place] Please consult [email or web address] for contact info under     Signed, Larae Grooms, MD  01/15/2021 11:27 AM  Cath Lab Visit (complete for each Cath Lab visit)  Clinical Evaluation Leading to the Procedure:   ACS: No.  Non-ACS:    Anginal Classification: CCS III  Anti-ischemic medical therapy: Maximal Therapy (2 or more classes of medications)  Non-Invasive Test Results: No non-invasive testing performed  Prior CABG: No previous CABG

## 2021-01-16 DIAGNOSIS — I200 Unstable angina: Secondary | ICD-10-CM

## 2021-01-16 DIAGNOSIS — I25118 Atherosclerotic heart disease of native coronary artery with other forms of angina pectoris: Secondary | ICD-10-CM | POA: Diagnosis not present

## 2021-01-16 DIAGNOSIS — I2584 Coronary atherosclerosis due to calcified coronary lesion: Secondary | ICD-10-CM | POA: Diagnosis not present

## 2021-01-16 DIAGNOSIS — I10 Essential (primary) hypertension: Secondary | ICD-10-CM | POA: Diagnosis not present

## 2021-01-16 DIAGNOSIS — E119 Type 2 diabetes mellitus without complications: Secondary | ICD-10-CM | POA: Diagnosis not present

## 2021-01-16 LAB — CBC
HCT: 33 % — ABNORMAL LOW (ref 36.0–46.0)
Hemoglobin: 11.9 g/dL — ABNORMAL LOW (ref 12.0–15.0)
MCH: 32 pg (ref 26.0–34.0)
MCHC: 36.1 g/dL — ABNORMAL HIGH (ref 30.0–36.0)
MCV: 88.7 fL (ref 80.0–100.0)
Platelets: 208 10*3/uL (ref 150–400)
RBC: 3.72 MIL/uL — ABNORMAL LOW (ref 3.87–5.11)
RDW: 13.2 % (ref 11.5–15.5)
WBC: 9.2 10*3/uL (ref 4.0–10.5)
nRBC: 0 % (ref 0.0–0.2)

## 2021-01-16 LAB — BASIC METABOLIC PANEL
Anion gap: 9 (ref 5–15)
BUN: 13 mg/dL (ref 8–23)
CO2: 22 mmol/L (ref 22–32)
Calcium: 9.2 mg/dL (ref 8.9–10.3)
Chloride: 102 mmol/L (ref 98–111)
Creatinine, Ser: 0.79 mg/dL (ref 0.44–1.00)
GFR, Estimated: >60 mL/min (ref >60)
Glucose, Bld: 214 mg/dL — ABNORMAL HIGH (ref 70–99)
Potassium: 3.8 mmol/L (ref 3.5–5.1)
Sodium: 133 mmol/L — ABNORMAL LOW (ref 135–145)

## 2021-01-16 LAB — GLUCOSE, CAPILLARY
Glucose-Capillary: 157 mg/dL — ABNORMAL HIGH (ref 70–99)
Glucose-Capillary: 215 mg/dL — ABNORMAL HIGH (ref 70–99)

## 2021-01-16 NOTE — Discharge Summary (Addendum)
Discharge Summary    Patient ID: Suzanne Watkins MRN: [id number]; DOB: June 03, 1951  Admit date: 01/15/2021 Discharge date: 01/16/2021  PCP:  Maximiano Coss, NP   [place]  Cardiologist:  Larae Grooms, MD  Advanced Practice Provider:  No care team member to display Electrophysiologist:  None  [id number]}    Discharge Diagnoses    Active Problems:   CAD (coronary artery disease)    Diagnostic Studies/Procedures    CARDIAC CATH: 01/15/2021  Non-stenotic Mid LAD lesion was previously treated.  Proximal OM1 stent was patent.  Mid OM1 lesion is 90% stenosed. Tortuous vessel and area of severe disease. A drug-eluting stent was successfully placed using a STENT RESOLUTE ONYX 2.25X34, postdilated to > 2.5 mm.  Post intervention, there is a 0% residual stenosis.  RCA stents are patent.  2nd RPL lesion is 80% stenosed.  RPDA lesion is 50% stenosed.  The left ventricular systolic function is normal.  LV end diastolic pressure is normal.  The left ventricular ejection fraction is 55-65% by visual estimate.  There is no aortic valve stenosis.  Femoral approach allowed for much more support than prior radial attempt.   Continue aggressive secondary prevention.  Dual antiplatelet therapy for 6 months.  Watch overnight and plan for home tomorrow.   Intervention     _____________   History of Present Illness     Suzanne Watkins is a 70 y.o. female with hx DMII, HTN, HLD, GERD, obesity, cervical CA.  She developed increasing anginal symptoms and was scheduled for cardiac catheterization.  Hospital Course     Consultants: None  She came to the hospital on 3/18 for cardiac catheterization, results are above.  She had a 90% stenosis in OM1, treated with DES.  She tolerated the procedure well.  Post procedure she had no chest pain or shortness of breath.  She was referred to cardiac rehab.  She is to continue high-dose statin, DAPT,  beta-blocker and ARB.  Hemoglobin A1c was checked and was elevated above her norm at 6.6.  She uses a home machine and her A1c's have been less than 6 for over a year.  She is encouraged to continue her current medication and continue to follow her A1c.  Follow-up with PCP.  She is to hold her Janumet for 48 hours.  On 3/19, she was seen by Dr. Lovena Le and all data were reviewed.  Her cath site is without ecchymosis or hematoma.  No further inpatient work-up is indicated and she is considered stable for discharge, to follow-up as an outpatient.  Did the patient have an acute coronary syndrome (MI, NSTEMI, STEMI, etc) this admission?:  No                               Did the patient have a percutaneous coronary intervention (stent / angioplasty)?:  Yes.     Cath/PCI Registry Performance & Quality Measures: 1. Aspirin prescribed? - Yes 2. ADP Receptor Inhibitor (Plavix/Clopidogrel, Brilinta/Ticagrelor or Effient/Prasugrel) prescribed (includes medically managed patients)? - Yes 3. High Intensity Statin (Lipitor 40-80mg  or Crestor 20-40mg ) prescribed? - Yes 4. For EF <40%, was ACEI/ARB prescribed? - Not Applicable (EF >/= 54%) 5. For EF <40%, Aldosterone Antagonist (Spironolactone or Eplerenone) prescribed? - Not Applicable (EF >/= 00%) 6. Cardiac Rehab Phase II ordered? - Yes       _____________  Discharge Vitals Blood pressure 132/86, pulse 78, temperature 98.6 F (37 C),  temperature source Oral, resp. rate 18, height 5' 2.5" (1.588 m), weight 80.6 kg, SpO2 99 %.  Filed Weights   01/15/21 1052  Weight: 80.6 kg  GEN: No acute distress.   Neck: No JVD Cardiac: RRR, no murmur, no rubs, or gallops.  Respiratory: clear to auscultation bilaterally with rales in the bases. GI: Soft, nontender, non-distended  MS: No edema; No deformity.  Right groin cath site without ecchymosis or hematoma.  No bruit, 4/4 extremity pulses 2+ Neuro:  Nonfocal  Psych: Normal affect     Labs & Radiologic  Studies    CBC Recent Labs    01/13/21 1202 01/16/21 0236  WBC 8.3 9.2  HGB 12.6 11.9*  HCT 37.6 33.0*  MCV 92 88.7  PLT 267 245   Basic Metabolic Panel Recent Labs    01/13/21 1202 01/16/21 0236  NA 140 133*  K 4.6 3.8  CL 106 102  CO2 23 22  GLUCOSE 154* 214*  BUN 19 13  CREATININE 0.86 0.79  CALCIUM 9.9 9.2   Liver Function Tests No results for input(s): AST, ALT, ALKPHOS, BILITOT, PROT, ALBUMIN in the last 72 hours. No results for input(s): LIPASE, AMYLASE in the last 72 hours. High Sensitivity Troponin:   No results for input(s): TROPONINIHS in the last 720 hours.  BNP Invalid input(s): POCBNP D-Dimer No results for input(s): DDIMER in the last 72 hours. Hemoglobin A1C Recent Labs    01/15/21 1530  HGBA1C 6.6*   Fasting Lipid Panel No results for input(s): CHOL, HDL, LDLCALC, TRIG, CHOLHDL, LDLDIRECT in the last 72 hours. Thyroid Function Tests No results for input(s): TSH, T4TOTAL, T3FREE, THYROIDAB in the last 72 hours.  Invalid input(s): FREET3 _____________  CARDIAC CATHETERIZATION  Result Date: 01/15/2021  Non-stenotic Mid LAD lesion was previously treated.  Proximal OM1 stent was patent.  Mid OM1 lesion is 90% stenosed. Tortuous vessel and area of severe disease. A drug-eluting stent was successfully placed using a STENT RESOLUTE ONYX 2.25X34, postdilated to > 2.5 mm.  Post intervention, there is a 0% residual stenosis.  RCA stents are patent.  2nd RPL lesion is 80% stenosed.  RPDA lesion is 50% stenosed.  The left ventricular systolic function is normal.  LV end diastolic pressure is normal.  The left ventricular ejection fraction is 55-65% by visual estimate.  There is no aortic valve stenosis.  Femoral approach allowed for much more support than prior radial attempt.  Continue aggressive secondary prevention.  Dual antiplatelet therapy for 6 months.  Watch overnight and plan for home tomorrow.    Disposition   Pt is being discharged  home today in good condition.  Follow-up Plans & Appointments     Discharge Instructions    AMB Referral to Cardiac Rehabilitation - Phase II   Complete by: As directed    Diagnosis: Coronary Stents   After initial evaluation and assessments completed: Virtual Based Care may be provided alone or in conjunction with Phase 2 Cardiac Rehab based on patient barriers.: Yes   Call MD for:  redness, tenderness, or signs of infection (pain, swelling, redness, odor or green/yellow discharge around incision site)   Complete by: As directed    Diet - low sodium heart healthy   Complete by: As directed    Diet Carb Modified   Complete by: As directed    Increase activity slowly   Complete by: As directed       Discharge Medications   Allergies as of 01/16/2021  Reactions   Plavix [clopidogrel Bisulfate] Other (See Comments)   Stomach upset   Adhesive [tape] Rash   Site latex allergy      Medication List    TAKE these medications   Accu-Chek Guide test strip Generic drug: glucose blood Use as instructed   amLODipine 5 MG tablet Commonly known as: NORVASC Take 1 tablet (5 mg total) by mouth in the morning and at bedtime.   aspirin 81 MG chewable tablet Chew 1 tablet (81 mg total) by mouth daily.   atorvastatin 80 MG tablet Commonly known as: LIPITOR TAKE 1 TABLET(80 MG) BY MOUTH DAILY AT 6 PM What changed: See the new instructions.   diphenhydrAMINE 50 MG capsule Commonly known as: BENADRYL Take 50 mg by mouth at bedtime.   ezetimibe 10 MG tablet Commonly known as: ZETIA TAKE 1 TABLET(10 MG) BY MOUTH DAILY What changed: See the new instructions.   isosorbide mononitrate 60 MG 24 hr tablet Commonly known as: IMDUR Take 1 tablet (60 mg total) by mouth daily.   Janumet XR 50-1000 MG Tb24 Generic drug: SitaGLIPtin-MetFORMIN HCl Take 2 tablets by mouth every morning.   metoprolol succinate 25 MG 24 hr tablet Commonly known as: TOPROL-XL TAKE 1 TABLET(25 MG) BY  MOUTH DAILY What changed: See the new instructions.   multivitamin with minerals Tabs tablet Take 1 tablet by mouth daily. Centrum Silver for Women   nitroGLYCERIN 0.4 MG SL tablet Commonly known as: Nitrostat Place 1 tablet (0.4 mg total) under the tongue every 5 (five) minutes as needed for chest pain.   omeprazole 20 MG capsule Commonly known as: PRILOSEC Take 20 mg by mouth daily before breakfast.   ticagrelor 90 MG Tabs tablet Commonly known as: Brilinta Take 1 tablet (90 mg total) by mouth 2 (two) times daily.   valsartan 320 MG tablet Commonly known as: DIOVAN TAKE 1 TABLET(320 MG) BY MOUTH DAILY What changed: See the new instructions.          Outstanding Labs/Studies   None  Duration of Discharge Encounter   Greater than 30 minutes including physician time.  Signed, Rosaria Ferries, PA-C 01/16/2021, 9:17 AM  Cardiology Attending  Patient seen and examined. Her groin is without hematoma or ecchymosis. She is stable for DC home with followup as above. She will continue ASA and Brilinta for anti-platelet therapy.  Carleene Overlie Dravin Lance,MD

## 2021-01-16 NOTE — Discharge Instructions (Signed)

## 2021-01-16 NOTE — Progress Notes (Signed)
CARDIAC REHAB PHASE I   PRE:  Rate/Rhythm: 78 SR  BP:  Supine:   Sitting: 133/64     SaO2: 97% RA  MODE:  Ambulation: 440 ft   POST:  Rate/Rhythm: 95 SR  BP:  Supine:   Sitting: 133/57    SaO2: 97% RA  Pt was independent with getting out of bed and with ambulation. Pt ambulated with steady gait for 464ft on RA. Pt did not have any complaints or concerns with ambulation. Returned pt seated on EOB. Pt was just here in December for a PTCA and is familiar with education for PTCA's and stents. Reviewed stent card, exercise guidelines, NTG use, importance of Brilinta and ASA, diabetic nutrition, diet, and CRP II. Pt is very active with walking at home with her husband and is not currently interested in CRP II due to being active on her own. Will still send referral to [facility] for CRPII.  [phone]  Rick Duff, MS, ACSM-CEP

## 2021-01-18 ENCOUNTER — Telehealth: Payer: Self-pay | Admitting: Interventional Cardiology

## 2021-01-18 ENCOUNTER — Encounter (HOSPITAL_COMMUNITY): Payer: Self-pay | Admitting: Interventional Cardiology

## 2021-01-18 NOTE — Telephone Encounter (Signed)
**Note De-Identified Joren Rehm Obfuscation** Patient contacted regarding discharge from [facility] on 01/16/2021.  Patient understands to follow up with Dr Irish Lack on 01/22/2021 at 9:20 at [address], [address] in [place], [place] [address]. Patient understands discharge instructions? Yes Patient understands medications and regiment? Yes Patient understands to bring all medications to this visit? Yes  Ask patient:  Are you enrolled in My Chart: Yes  The pt states she is doing well and denies CP/discomfort, SOB, dizziness, lightheadedness, nausea, diaphoresis, or headaches at this time.  She does have Dr Hassell Done office phone number at [facility] to call if she has any questions or concerns. She thanked me for my call.

## 2021-01-18 NOTE — Telephone Encounter (Signed)
Patient has been scheduled for a [facility] hospital f/u per [facility] staff message on 01/22/21 at 9:20 AM. Please advise.

## 2021-01-19 ENCOUNTER — Telehealth (HOSPITAL_COMMUNITY): Payer: Self-pay

## 2021-01-19 NOTE — Telephone Encounter (Signed)
Per Phase I pt is not interested at this time, walking at home with husband.  Closed referral

## 2021-01-21 NOTE — Progress Notes (Signed)
Cardiology Office Note   Date:  01/22/2021   ID:  Suzanne Watkins, DOB 11/16/1950, MRN [id number]  PCP:  Maximiano Coss, NP    No chief complaint on file.  CAD  Wt Readings from Last 3 Encounters:  01/22/21 175 lb 6.4 oz (79.6 kg)  01/15/21 177 lb 9.6 oz (80.6 kg)  10/21/20 178 lb 3.2 oz (80.8 kg)       History of Present Illness: Suzanne Watkins is a 70 y.o. female  Who has had CAD. She has had stents in all three coronary vessels. Most recent were in 11/14.  Sheused towalk a lot on the [employer] She retired in Jan 2019, so she joined a senior center to exercise on the treadmill.  She had a broken leg in 11/19.She had surgery in December 12/19. She did PT. No cardiac issues at that time.  She had some SHOB in Jan 2021. She has some intermittent tachycardia, when she checks her pulse ox. CT scan: done showed no PE, mild emphysema.  In 10/21, she had an episode of left arm pain with SHOB. She used a SL NTG and had some relief. She had one episode of chest tightness again and used SL NTG.   In 2021, Imdur was added and amlodipine was increased to 5 mg BID.BP improved.  The patientdoes nothave symptoms concerning for COVID-19 infection (fever, chills, cough, or new shortness of breath).   2021 cath showed:  "Previously placed Mid LAD stent (unknown type) is widely patent.  Previously placed Prox RCA stent (unknown type) is widely patent.  Previously placed Mid RCA to Dist RCA stent (unknown type) is widely patent.  RPDA lesion is 50% stenosed.  2nd RPL lesion is 100% stenosed. This behaved like a CTO. Balloon angioplasty was performed using a BALLOON SPRINT LEG OTW 1.25X10. Post intervention, there is a 80% residual stenosis.  Previously placed 1st Mrg-1 stent (unknown type) is widely patent.  1st Mrg-2 lesion is 90% stenosed.  Balloon angioplasty was performed using a BALLOON SAPPHIRE 2.0X12. Unable to deliver stent due to tortuosity  in vessel.  Post intervention, there is a 10% residual stenosis.  The left ventricular systolic function is normal.  LV end diastolic pressure is normal.  The left ventricular ejection fraction is 55-65% by visual estimate.  There is no aortic valve stenosis.  Post intervention, there is a 80% residual stenosis.  Balloon angioplasty was performed using a BALLOON SPRINT LEG OTW 1.25X10.   She had recurrent angina. Cath in 12/2020 showed:  "Non-stenotic Mid LAD lesion was previously treated.  Proximal OM1 stent was patent.  Mid OM1 lesion is 90% stenosed. Tortuous vessel and area of severe disease. A drug-eluting stent was successfully placed using a STENT RESOLUTE ONYX 2.25X34, postdilated to > 2.5 mm.  Post intervention, there is a 0% residual stenosis.  RCA stents are patent.  2nd RPL lesion is 80% stenosed.  RPDA lesion is 50% stenosed.  The left ventricular systolic function is normal.  LV end diastolic pressure is normal.  The left ventricular ejection fraction is 55-65% by visual estimate.  There is no aortic valve stenosis.  Femoral approach allowed for much more support than prior radial attempt.   Continue aggressive secondary prevention.  Dual antiplatelet therapy for 6 months.  Watch overnight and plan for home tomorrow. "  Since the PCI in 3/22, she feels well.  Denies : Chest pain. Dizziness. Leg edema. Nitroglycerin use. Orthopnea. Palpitations. Paroxysmal nocturnal dyspnea. Shortness of  breath. Syncope.   Walking up to 17 minutes post cath without angina which is a significant improvement compared to before her most recent PCI in March 2022.  She has noted some bruising in her right groin.  No swelling or active bleeding.     Past Medical History:  Diagnosis Date   Anginal pain ([place])    Arthritis    "probably in my hands" (09/04/2013)   Basal cell carcinoma of nostril 05/2008   Cancer ([place])    Phreesia 05/17/2020   Cervical cancer ([place])  1986   Coronary atherosclerosis of native coronary artery 09/04/2013   RCA and obtuse marginal stent/DES-2010   Diabetes mellitus without complication ([place])    Phreesia 05/17/2020   Fibromyalgia    "dx'd in 1994"   Full dentures    GERD (gastroesophageal reflux disease)    Hyperlipidemia    Hypertension    Myocardial infarction ([place])    Phreesia 05/17/2020   Obesity    Shortness of breath    "just related to angina I was having recently" (09/04/2013)   Type II diabetes mellitus ([place])    followed by pcp   Wears glasses     Past Surgical History:  Procedure Laterality Date   CARPAL TUNNEL RELEASE Right 07/1992   CERVICAL CONE BIOPSY  03/1985   CORONARY ANGIOPLASTY WITH STENT PLACEMENT  07/2009, 08/2009; 09/04/2013   "1+1+2; total of 4" (09/04/2013)   CORONARY BALLOON ANGIOPLASTY N/A 10/06/2020   Procedure: CORONARY BALLOON ANGIOPLASTY;  Surgeon: Jettie Booze, MD;  Location: [place] [facility];  Service: Cardiovascular;  Laterality: N/A;   CORONARY STENT INTERVENTION N/A 01/15/2021   Procedure: CORONARY STENT INTERVENTION;  Surgeon: Jettie Booze, MD;  Location: [place] [facility];  Service: Cardiovascular;  Laterality: N/A;   FRACTURE SURGERY N/A    Phreesia 05/17/2020   LEFT HEART CATH AND CORONARY ANGIOGRAPHY N/A 10/06/2020   Procedure: LEFT HEART CATH AND CORONARY ANGIOGRAPHY;  Surgeon: Jettie Booze, MD;  Location: [place] [facility];  Service: Cardiovascular;  Laterality: N/A;   LEFT HEART CATH AND CORONARY ANGIOGRAPHY N/A 01/15/2021   Procedure: LEFT HEART CATH AND CORONARY ANGIOGRAPHY;  Surgeon: Jettie Booze, MD;  Location: [place] [facility];  Service: Cardiovascular;  Laterality: N/A;   LEFT HEART CATHETERIZATION WITH CORONARY ANGIOGRAM N/A 09/04/2013   Procedure: LEFT HEART CATHETERIZATION WITH CORONARY ANGIOGRAM;  Surgeon: Candee Furbish, MD;  Location: [facility] C[facility] LAB;  Service: Cardiovascular;  Laterality: N/A;   MOHS SURGERY Left  05/2008   "nostril"   ORIF ANKLE FRACTURE Right 10/10/2018   Procedure: OPEN REDUCTION INTERNAL FIXATION (ORIF) ANKLE FRACTURE;  Surgeon: Rod Can, MD;  Location: [facility];  Service: Orthopedics;  Laterality: Right;   SHOULDER OPEN ROTATOR CUFF REPAIR Right 10/2004   "got 5 pins in" (09/04/2013)   TUBAL LIGATION Bilateral 1979     Current Outpatient Medications  Medication Sig Dispense Refill   amLODipine (NORVASC) 5 MG tablet Take 1 tablet (5 mg total) by mouth in the morning and at bedtime. 180 tablet 3   aspirin 81 MG chewable tablet Chew 1 tablet (81 mg total) by mouth daily. 90 tablet 0   atorvastatin (LIPITOR) 80 MG tablet TAKE 1 TABLET(80 MG) BY MOUTH DAILY AT 6 PM (Patient taking differently: Take 80 mg by mouth daily.) 90 tablet 2   diphenhydrAMINE (BENADRYL) 50 MG capsule Take 50 mg by mouth at bedtime.     ezetimibe (ZETIA) 10 MG tablet TAKE 1 TABLET(10 MG) BY  MOUTH DAILY (Patient taking differently: Take 10 mg by mouth at bedtime.) 90 tablet 2   glucose blood (ACCU-CHEK GUIDE) test strip Use as instructed 300 each 12   isosorbide mononitrate (IMDUR) 60 MG 24 hr tablet Take 1 tablet (60 mg total) by mouth daily. 90 tablet 3   metoprolol succinate (TOPROL-XL) 25 MG 24 hr tablet TAKE 1 TABLET(25 MG) BY MOUTH DAILY (Patient taking differently: Take 25 mg by mouth daily.) 90 tablet 3   Multiple Vitamin (MULTIVITAMIN WITH MINERALS) TABS tablet Take 1 tablet by mouth daily. Centrum Silver for Women     nitroGLYCERIN (NITROSTAT) 0.4 MG SL tablet Place 1 tablet (0.4 mg total) under the tongue every 5 (five) minutes as needed for chest pain. 25 tablet 3   omeprazole (PRILOSEC) 20 MG capsule Take 20 mg by mouth daily before breakfast.      SitaGLIPtin-MetFORMIN HCl (JANUMET XR) 50-1000 MG TB24 Take 2 tablets by mouth every morning. 180 tablet 1   ticagrelor (BRILINTA) 90 MG TABS tablet Take 1 tablet (90 mg total) by mouth 2 (two) times daily. 180 tablet 2   valsartan  (DIOVAN) 320 MG tablet TAKE 1 TABLET(320 MG) BY MOUTH DAILY (Patient taking differently: Take 320 mg by mouth daily.) 90 tablet 1   No current facility-administered medications for this visit.    Allergies:   Plavix [clopidogrel bisulfate] and Adhesive [tape]    Social History:  The patient  reports that she quit smoking about 14 years ago. Her smoking use included cigarettes. She has a 29.00 pack-year smoking history. She has never used smokeless tobacco. She reports that she does not drink alcohol and does not use drugs.   Family History:  The patient's family history includes Cancer in her father; Diabetes in her daughter; Drug abuse in her brother and daughter; Heart disease in her daughter and paternal grandmother; Heart disease (age of onset: 63) in her mother; Hyperlipidemia in her brother and mother; Hypertension in her brother; Lupus in her daughter; Stroke in her paternal grandfather.    ROS:  Please see the history of present illness.   Otherwise, review of systems are positive for groin bruising.   All other systems are reviewed and negative.    PHYSICAL EXAM: VS:  BP 132/60    Pulse 91    Ht 5' 2.5" (1.588 m)    Wt 175 lb 6.4 oz (79.6 kg)    SpO2 99%    BMI 31.57 kg/m  , BMI Body mass index is 31.57 kg/m. GEN: Well nourished, well developed, in no acute distress  HEENT: normal  Neck: no JVD, carotid bruits, or masses Cardiac: RRR; no murmurs, rubs, or gallops,no edema  Respiratory:  clear to auscultation bilaterally, normal work of breathing GI: soft, nontender, nondistended, + BS MS: no deformity or atrophy ; right groin bruising- no bruit; quarter sized firm area at the access site Skin: warm and dry, no rash Neuro:  Strength and sensation are intact Psych: euthymic mood, full affect   Recent Labs: 01/16/2021: BUN 13; Creatinine, Ser 0.79; Hemoglobin 11.9; Platelets 208; Potassium 3.8; Sodium 133   Lipid Panel    Component Value Date/Time   CHOL 126 01/06/2020 0835    CHOL 127 10/28/2014 0801   TRIG 149 01/06/2020 0835   TRIG 192 (H) 10/28/2014 0801   HDL 39 (L) 01/06/2020 0835   HDL 30 (L) 10/28/2014 0801   CHOLHDL 3.2 01/06/2020 0835   CHOLHDL 3.8 08/08/2016 0731   VLDL 22 08/08/2016 0731  Benld 61 01/06/2020 0835   LDLCALC 59 10/28/2014 0801     Other studies Reviewed: Additional studies/ records that were reviewed today with results demonstrating: Cath results reviewed.  Labs reviewed.  A1c was higher than she expected while she was in the hospital.   ASSESSMENT AND PLAN:  1. CAD: Angina resolved.  She does have residual branch vessel disease in the distal RCA which she has had for many years-since the vessel is so small.  Continue medical therapy. 2. Hyperlipidemia: Continue aggressive lipid-lowering therapy.  Will need lipids checked at some point this year.  Since her walking ability is now increased after PCI, will check later in the year. 3. DM: 6.6 A1c in the hospital.  This should also improve with more exercise. 4. Hypertensive heart disease: The current medical regimen is effective;  continue present plan and medications.    Current medicines are reviewed at length with the patient today.  The patient concerns regarding her medicines were addressed.  The following changes have been made:  No change  Labs/ tests ordered today include:  No orders of the defined types were placed in this encounter.   Recommend 150 minutes/week of aerobic exercise Low fat, low carb, high fiber diet recommended  Disposition:   FU in 6 months   Signed, Larae Grooms, MD  01/22/2021 9:46 AM    [facility] [address], [place], [place]  [address] Phone: [phone]; Fax: [phone]

## 2021-01-22 ENCOUNTER — Encounter: Payer: Self-pay | Admitting: Interventional Cardiology

## 2021-01-22 ENCOUNTER — Other Ambulatory Visit: Payer: Self-pay

## 2021-01-22 ENCOUNTER — Ambulatory Visit: Payer: Medicare PPO | Admitting: Interventional Cardiology

## 2021-01-22 VITALS — BP 132/60 | HR 91 | Ht 62.5 in | Wt 175.4 lb

## 2021-01-22 DIAGNOSIS — I119 Hypertensive heart disease without heart failure: Secondary | ICD-10-CM

## 2021-01-22 DIAGNOSIS — E1159 Type 2 diabetes mellitus with other circulatory complications: Secondary | ICD-10-CM | POA: Diagnosis not present

## 2021-01-22 DIAGNOSIS — E782 Mixed hyperlipidemia: Secondary | ICD-10-CM | POA: Diagnosis not present

## 2021-01-22 DIAGNOSIS — I25118 Atherosclerotic heart disease of native coronary artery with other forms of angina pectoris: Secondary | ICD-10-CM

## 2021-01-22 NOTE — Patient Instructions (Signed)
Medication Instructions:  Your physician recommends that you continue on your current medications as directed. Please refer to the Current Medication list given to you today.  *If you need a refill on your cardiac medications before your next appointment, please call your pharmacy*   Lab Work: none If you have labs (blood work) drawn today and your tests are completely normal, you will receive your results only by: Marland Kitchen MyChart Message (if you have MyChart) OR . A paper copy in the mail If you have any lab test that is abnormal or we need to change your treatment, we will call you to review the results.   Testing/Procedures: none   Follow-Up: At [facility], you and your health needs are our priority.  As part of our continuing mission to provide you with exceptional heart care, we have created designated Provider Care Teams.  These Care Teams include your primary Cardiologist (physician) and Advanced Practice Providers (APPs -  Physician Assistants and Nurse Practitioners) who all work together to provide you with the care you need, when you need it.  We recommend signing up for the patient portal called "MyChart".  Sign up information is provided on this After Visit Summary.  MyChart is used to connect with patients for Virtual Visits (Telemedicine).  Patients are able to view lab/test results, encounter notes, upcoming appointments, etc.  Non-urgent messages can be sent to your provider as well.   To learn more about what you can do with MyChart, go to [email or web address].    Your next appointment:   07/26/21 at 9:40  The format for your next appointment:   In Person  Provider:   Casandra Doffing, MD   Other Instructions

## 2021-02-24 ENCOUNTER — Ambulatory Visit: Payer: Medicare PPO | Admitting: Interventional Cardiology

## 2021-03-31 ENCOUNTER — Other Ambulatory Visit: Payer: Self-pay | Admitting: Interventional Cardiology

## 2021-06-29 ENCOUNTER — Other Ambulatory Visit: Payer: Self-pay | Admitting: Interventional Cardiology

## 2021-06-29 ENCOUNTER — Other Ambulatory Visit: Payer: Self-pay | Admitting: Registered Nurse

## 2021-06-29 DIAGNOSIS — E1159 Type 2 diabetes mellitus with other circulatory complications: Secondary | ICD-10-CM

## 2021-07-23 NOTE — Telephone Encounter (Signed)
We can try changing Imdur to 60 mg BID over the weekend to see if this helps evening symptoms.  Her sx are typically quite accurate.      I think she will need another cath is she is not responding to increased Imdur.  JV

## 2021-07-25 NOTE — H&P (View-Only) (Signed)
  Cardiology Office Note   Date:  07/26/2021   ID:  Suzanne Watkins, DOB 10/22/1951, MRN [id number]  PCP:  Morrow, Richard, NP    No chief complaint on file.  CAD  Wt Readings from Last 3 Encounters:  07/26/21 173 lb 3.2 oz (78.6 kg)  01/22/21 175 lb 6.4 oz (79.6 kg)  01/15/21 177 lb 9.6 oz (80.6 kg)       History of Present Illness: Suzanne Watkins is a 70 y.o. female    Who has had CAD.  She has had stents in all three coronary vessels.  Most recent were in 11/14.   She used to walk a lot on the [employer] [facility].  She retired in Jan 2019, so she joined a senior center to exercise on the treadmill.   She had a broken leg in 11/19.  She had surgery in December 12/19.  She did PT.  No cardiac issues at that time.    She had some SHOB in Jan 2021.  She has some intermittent tachycardia, when she checks her pulse ox.  CT scan: done showed no PE, mild emphysema.   In 10/21, she had an episode of left arm pain with SHOB.  She used a SL NTG and had some relief.  She had one episode of chest tightness again and used SL NTG.     In 2021, Imdur was added and amlodipine was increased to 5 mg BID. BP improved.   The patient does not have symptoms concerning for COVID-19 infection (fever, chills, cough, or new shortness of breath).    2021 cath resulted in PTCA of OM, unable to advance stent due to tortuosity from the radial approach.    She had recurrent angina. Cath in 12/2020 showed: "Non-stenotic Mid LAD lesion was previously treated. Proximal OM1 stent was patent. Mid OM1 lesion is 90% stenosed. Tortuous vessel and area of severe disease. A drug-eluting stent was successfully placed using a STENT RESOLUTE ONYX 2.25X34, postdilated to > 2.5 mm. Post intervention, there is a 0% residual stenosis. RCA stents are patent. 2nd RPL lesion is 80% stenosed. RPDA lesion is 50% stenosed. The left ventricular systolic function is normal. LV end diastolic pressure is normal. The left  ventricular ejection fraction is 55-65% by visual estimate. There is no aortic valve stenosis. Femoral approach allowed for much more support than prior radial attempt.   Continue aggressive secondary prevention.  Dual antiplatelet therapy for 6 months.  Watch overnight and plan for home tomorrow. "  Over the last few weeks, she is having more exertional angina.  We changed Imdur to 60 mg BID.  She felt she had side effects with the 60 mg BID.  She instead took 90 mg in the AM and 30 mg in the PM.    She has had her flu shot and omicron booster.  Past Medical History:  Diagnosis Date   Anginal pain ([facility])    Arthritis    "probably in my hands" (09/04/2013)   Basal cell carcinoma of nostril 05/2008   Cancer ([facility])    Phreesia 05/17/2020   Cervical cancer ([facility]) 1986   Coronary atherosclerosis of native coronary artery 09/04/2013   RCA and obtuse marginal stent/DES-2010   Diabetes mellitus without complication ([facility])    Phreesia 05/17/2020   Fibromyalgia    "dx'd in 1994"   Full dentures    GERD (gastroesophageal reflux disease)    Hyperlipidemia    Hypertension    Myocardial infarction ([facility])      Phreesia 05/17/2020   Obesity    Shortness of breath    "just related to angina I was having recently" (09/04/2013)   Type II diabetes mellitus ([place])    followed by pcp   Wears glasses     Past Surgical History:  Procedure Laterality Date   CARPAL TUNNEL RELEASE Right 07/1992   CERVICAL CONE BIOPSY  03/1985   CORONARY ANGIOPLASTY WITH STENT PLACEMENT  07/2009, 08/2009; 09/04/2013   "1+1+2; total of 4" (09/04/2013)   CORONARY BALLOON ANGIOPLASTY N/A 10/06/2020   Procedure: CORONARY BALLOON ANGIOPLASTY;  Surgeon: Jettie Booze, MD;  Location: [place] [facility];  Service: Cardiovascular;  Laterality: N/A;   CORONARY STENT INTERVENTION N/A 01/15/2021   Procedure: CORONARY STENT INTERVENTION;  Surgeon: Jettie Booze, MD;  Location: [place] [facility];  Service: Cardiovascular;   Laterality: N/A;   FRACTURE SURGERY N/A    Phreesia 05/17/2020   LEFT HEART CATH AND CORONARY ANGIOGRAPHY N/A 10/06/2020   Procedure: LEFT HEART CATH AND CORONARY ANGIOGRAPHY;  Surgeon: Jettie Booze, MD;  Location: [place] [facility];  Service: Cardiovascular;  Laterality: N/A;   LEFT HEART CATH AND CORONARY ANGIOGRAPHY N/A 01/15/2021   Procedure: LEFT HEART CATH AND CORONARY ANGIOGRAPHY;  Surgeon: Jettie Booze, MD;  Location: [place] [facility];  Service: Cardiovascular;  Laterality: N/A;   LEFT HEART CATHETERIZATION WITH CORONARY ANGIOGRAM N/A 09/04/2013   Procedure: LEFT HEART CATHETERIZATION WITH CORONARY ANGIOGRAM;  Surgeon: Candee Furbish, MD;  Location: [facility] C[facility] LAB;  Service: Cardiovascular;  Laterality: N/A;   MOHS SURGERY Left 05/2008   "nostril"   ORIF ANKLE FRACTURE Right 10/10/2018   Procedure: OPEN REDUCTION INTERNAL FIXATION (ORIF) ANKLE FRACTURE;  Surgeon: Rod Can, MD;  Location: [facility];  Service: Orthopedics;  Laterality: Right;   SHOULDER OPEN ROTATOR CUFF REPAIR Right 10/2004   "got 5 pins in" (09/04/2013)   TUBAL LIGATION Bilateral 1979     Current Outpatient Medications  Medication Sig Dispense Refill   amLODipine (NORVASC) 5 MG tablet TAKE 1 TABLET(5 MG) BY MOUTH IN THE MORNING AND AT BEDTIME 180 tablet 1   aspirin 81 MG chewable tablet Chew 1 tablet (81 mg total) by mouth daily. 90 tablet 0   atorvastatin (LIPITOR) 80 MG tablet TAKE 1 TABLET(80 MG) BY MOUTH DAILY AT 6 PM 90 tablet 1   diphenhydrAMINE (BENADRYL) 50 MG capsule Take 50 mg by mouth at bedtime.     ezetimibe (ZETIA) 10 MG tablet TAKE 1 TABLET(10 MG) BY MOUTH DAILY 90 tablet 1   glucose blood (ACCU-CHEK GUIDE) test strip Use as instructed 300 each 12   isosorbide mononitrate (IMDUR) 60 MG 24 hr tablet TAKE 1 TABLET(60 MG) BY MOUTH DAILY (Patient taking differently: Take 60 mg by mouth 2 (two) times daily. TAKE 1.5 TAB (90MG) IN THE AM BY MOUTH, TAKE HALF TABLET (30MG) AFTER 6 PM BY MOUTH.) 90  tablet 1   JANUMET XR 50-1000 MG TB24 TAKE 2 TABLETS BY MOUTH EVERY MORNING 180 tablet 0   metoprolol succinate (TOPROL-XL) 25 MG 24 hr tablet TAKE 1 TABLET(25 MG) BY MOUTH DAILY (Patient taking differently: Take 25 mg by mouth daily.) 90 tablet 3   Multiple Vitamin (MULTIVITAMIN WITH MINERALS) TABS tablet Take 1 tablet by mouth daily. Centrum Silver for Women     nitroGLYCERIN (NITROSTAT) 0.4 MG SL tablet Place 1 tablet (0.4 mg total) under the tongue every 5 (five) minutes as needed for chest pain. 25 tablet 3   omeprazole (PRILOSEC) 20  MG capsule Take 20 mg by mouth daily before breakfast.      ticagrelor (BRILINTA) 90 MG TABS tablet Take 1 tablet (90 mg total) by mouth 2 (two) times daily. 180 tablet 2   valsartan (DIOVAN) 320 MG tablet TAKE 1 TABLET(320 MG) BY MOUTH DAILY 90 tablet 1   No current facility-administered medications for this visit.    Allergies:   Plavix [clopidogrel bisulfate] and Adhesive [tape]    Social History:  The patient  reports that she quit smoking about 15 years ago. Her smoking use included cigarettes. She has a 29.00 pack-year smoking history. She has never used smokeless tobacco. She reports that she does not drink alcohol and does not use drugs.   Family History:  The patient's family history includes Cancer in her father; Diabetes in her daughter; Drug abuse in her brother and daughter; Heart disease in her daughter and paternal grandmother; Heart disease (age of onset: 62) in her mother; Hyperlipidemia in her brother and mother; Hypertension in her brother; Lupus in her daughter; Stroke in her paternal grandfather.    ROS:  Please see the history of present illness.   Otherwise, review of systems are positive for increasing angina.   All other systems are reviewed and negative.    PHYSICAL EXAM: VS:  BP 110/64   Pulse 74   Ht 5' 2.5" (1.588 m)   Wt 173 lb 3.2 oz (78.6 kg)   SpO2 92%   BMI 31.17 kg/m  , BMI Body mass index is 31.17 kg/m. GEN: Well  nourished, well developed, in no acute distress HEENT: normal Neck: no JVD, carotid bruits, or masses Cardiac: RRR; no murmurs, rubs, or gallops,no edema  Respiratory:  clear to auscultation bilaterally, normal work of breathing GI: soft, nontender, nondistended, + BS MS: no deformity or atrophy Skin: warm and dry, no rash Neuro:  Strength and sensation are intact Psych: euthymic mood, full affect   EKG:   The ekg ordered today demonstrates NSR, nonspecific ST changes   Recent Labs: 01/16/2021: BUN 13; Creatinine, Ser 0.79; Hemoglobin 11.9; Platelets 208; Potassium 3.8; Sodium 133   Lipid Panel    Component Value Date/Time   CHOL 126 01/06/2020 0835   CHOL 127 10/28/2014 0801   TRIG 149 01/06/2020 0835   TRIG 192 (H) 10/28/2014 0801   HDL 39 (L) 01/06/2020 0835   HDL 30 (L) 10/28/2014 0801   CHOLHDL 3.2 01/06/2020 0835   CHOLHDL 3.8 08/08/2016 0731   VLDL 22 08/08/2016 0731   LDLCALC 61 01/06/2020 0835   LDLCALC 59 10/28/2014 0801     Other studies Reviewed: Additional studies/ records that were reviewed today with results demonstrating: Prior cath films reviewed with the patient.   ASSESSMENT AND PLAN:  CAD/Old MI: Known CAD including posterolateral branch stenosis.  This is a small, tortuous vessel.  I tried to wire this in the past and was unsuccessful.  After her OM was stented the last time, she was angina free.  I suspect her current angina is more likely related to restenosis in the OM rather than anything in her right coronary system.  The other possibility is that she has a totally new lesion but given the timeframe since the stent was placed in the extremely tortuous OM, I think restenosis is most likely.  We will plan for repeat cardiac catheterization.  As noted in the past, there was lack of support in getting equipment down the tortuous vessel from the right radial approach. Hyperlipidemia: Check lipids  today.  Continue high-dose statin. DM: Check A1c today.   She does check at home but the A1c readings she gets on her home tests are lower than what we have gotten in the lab. Hypertensive heart disease: The current medical regimen is effective;  continue present plan and medications.    Current medicines are reviewed at length with the patient today.  The patient concerns regarding her medicines were addressed.  The following changes have been made:  No change  Labs/ tests ordered today include:   Orders Placed This Encounter  Procedures   CBC   Comp Met (CMET)   Lipid Profile   HgB A1c   EKG 12-Lead    Recommend 150 minutes/week of aerobic exercise Low fat, low carb, high fiber diet recommended  Disposition:   FU in for cath   Signed, Larae Grooms, MD  07/26/2021 10:09 AM    [facility] [address], [place], [place]  [address] Phone: [phone]; Fax: [phone]

## 2021-07-25 NOTE — Progress Notes (Signed)
  Cardiology Office Note   Date:  07/26/2021   ID:  Suzanne Watkins, DOB 08/11/1951, MRN [id number]  PCP:  Morrow, Richard, NP    No chief complaint on file.  CAD  Wt Readings from Last 3 Encounters:  07/26/21 173 lb 3.2 oz (78.6 kg)  01/22/21 175 lb 6.4 oz (79.6 kg)  01/15/21 177 lb 9.6 oz (80.6 kg)       History of Present Illness: Suzanne Watkins is a 70 y.o. female    Who has had CAD.  She has had stents in all three coronary vessels.  Most recent were in 11/14.   She used to walk a lot on the [employer] campus.  She retired in Jan 2019, so she joined a senior center to exercise on the treadmill.   She had a broken leg in 11/19.  She had surgery in December 12/19.  She did PT.  No cardiac issues at that time.    She had some SHOB in Jan 2021.  She has some intermittent tachycardia, when she checks her pulse ox.  CT scan: done showed no PE, mild emphysema.   In 10/21, she had an episode of left arm pain with SHOB.  She used a SL NTG and had some relief.  She had one episode of chest tightness again and used SL NTG.     In 2021, Imdur was added and amlodipine was increased to 5 mg BID. BP improved.   The patient does not have symptoms concerning for COVID-19 infection (fever, chills, cough, or new shortness of breath).    2021 cath resulted in PTCA of OM, unable to advance stent due to tortuosity from the radial approach.    She had recurrent angina. Cath in 12/2020 showed: "Non-stenotic Mid LAD lesion was previously treated. Proximal OM1 stent was patent. Mid OM1 lesion is 90% stenosed. Tortuous vessel and area of severe disease. A drug-eluting stent was successfully placed using a STENT RESOLUTE ONYX 2.25X34, postdilated to > 2.5 mm. Post intervention, there is a 0% residual stenosis. RCA stents are patent. 2nd RPL lesion is 80% stenosed. RPDA lesion is 50% stenosed. The left ventricular systolic function is normal. LV end diastolic pressure is normal. The left  ventricular ejection fraction is 55-65% by visual estimate. There is no aortic valve stenosis. Femoral approach allowed for much more support than prior radial attempt.   Continue aggressive secondary prevention.  Dual antiplatelet therapy for 6 months.  Watch overnight and plan for home tomorrow. "  Over the last few weeks, she is having more exertional angina.  We changed Imdur to 60 mg BID.  She felt she had side effects with the 60 mg BID.  She instead took 90 mg in the AM and 30 mg in the PM.    She has had her flu shot and omicron booster.  Past Medical History:  Diagnosis Date   Anginal pain (HCC)    Arthritis    "probably in my hands" (09/04/2013)   Basal cell carcinoma of nostril 05/2008   Cancer (HCC)    Phreesia 05/17/2020   Cervical cancer (HCC) 1986   Coronary atherosclerosis of native coronary artery 09/04/2013   RCA and obtuse marginal stent/DES-2010   Diabetes mellitus without complication (HCC)    Phreesia 05/17/2020   Fibromyalgia    "dx'd in 1994"   Full dentures    GERD (gastroesophageal reflux disease)    Hyperlipidemia    Hypertension    Myocardial infarction (HCC)      Phreesia 05/17/2020   Obesity    Shortness of breath    "just related to angina I was having recently" (09/04/2013)   Type II diabetes mellitus (HCC)    followed by pcp   Wears glasses     Past Surgical History:  Procedure Laterality Date   CARPAL TUNNEL RELEASE Right 07/1992   CERVICAL CONE BIOPSY  03/1985   CORONARY ANGIOPLASTY WITH STENT PLACEMENT  07/2009, 08/2009; 09/04/2013   "1+1+2; total of 4" (09/04/2013)   CORONARY BALLOON ANGIOPLASTY N/A 10/06/2020   Procedure: CORONARY BALLOON ANGIOPLASTY;  Surgeon: Aziz Slape S, MD;  Location: [facility];  Service: Cardiovascular;  Laterality: N/A;   CORONARY STENT INTERVENTION N/A 01/15/2021   Procedure: CORONARY STENT INTERVENTION;  Surgeon: Juley Giovanetti S, MD;  Location: [facility];  Service: Cardiovascular;   Laterality: N/A;   FRACTURE SURGERY N/A    Phreesia 05/17/2020   LEFT HEART CATH AND CORONARY ANGIOGRAPHY N/A 10/06/2020   Procedure: LEFT HEART CATH AND CORONARY ANGIOGRAPHY;  Surgeon: Verneda Hollopeter S, MD;  Location: [facility];  Service: Cardiovascular;  Laterality: N/A;   LEFT HEART CATH AND CORONARY ANGIOGRAPHY N/A 01/15/2021   Procedure: LEFT HEART CATH AND CORONARY ANGIOGRAPHY;  Surgeon: Mitcheal Sweetin S, MD;  Location: [facility];  Service: Cardiovascular;  Laterality: N/A;   LEFT HEART CATHETERIZATION WITH CORONARY ANGIOGRAM N/A 09/04/2013   Procedure: LEFT HEART CATHETERIZATION WITH CORONARY ANGIOGRAM;  Surgeon: Mark Skains, MD;  Location: [facility];  Service: Cardiovascular;  Laterality: N/A;   MOHS SURGERY Left 05/2008   "nostril"   ORIF ANKLE FRACTURE Right 10/10/2018   Procedure: OPEN REDUCTION INTERNAL FIXATION (ORIF) ANKLE FRACTURE;  Surgeon: Swinteck, Brian, MD;  Location: [facility];  Service: Orthopedics;  Laterality: Right;   SHOULDER OPEN ROTATOR CUFF REPAIR Right 10/2004   "got 5 pins in" (09/04/2013)   TUBAL LIGATION Bilateral 1979     Current Outpatient Medications  Medication Sig Dispense Refill   amLODipine (NORVASC) 5 MG tablet TAKE 1 TABLET(5 MG) BY MOUTH IN THE MORNING AND AT BEDTIME 180 tablet 1   aspirin 81 MG chewable tablet Chew 1 tablet (81 mg total) by mouth daily. 90 tablet 0   atorvastatin (LIPITOR) 80 MG tablet TAKE 1 TABLET(80 MG) BY MOUTH DAILY AT 6 PM 90 tablet 1   diphenhydrAMINE (BENADRYL) 50 MG capsule Take 50 mg by mouth at bedtime.     ezetimibe (ZETIA) 10 MG tablet TAKE 1 TABLET(10 MG) BY MOUTH DAILY 90 tablet 1   glucose blood (ACCU-CHEK GUIDE) test strip Use as instructed 300 each 12   isosorbide mononitrate (IMDUR) 60 MG 24 hr tablet TAKE 1 TABLET(60 MG) BY MOUTH DAILY (Patient taking differently: Take 60 mg by mouth 2 (two) times daily. TAKE 1.5 TAB (90MG) IN THE AM BY MOUTH, TAKE HALF TABLET (30MG) AFTER 6 PM BY MOUTH.) 90  tablet 1   JANUMET XR 50-1000 MG TB24 TAKE 2 TABLETS BY MOUTH EVERY MORNING 180 tablet 0   metoprolol succinate (TOPROL-XL) 25 MG 24 hr tablet TAKE 1 TABLET(25 MG) BY MOUTH DAILY (Patient taking differently: Take 25 mg by mouth daily.) 90 tablet 3   Multiple Vitamin (MULTIVITAMIN WITH MINERALS) TABS tablet Take 1 tablet by mouth daily. Centrum Silver for Women     nitroGLYCERIN (NITROSTAT) 0.4 MG SL tablet Place 1 tablet (0.4 mg total) under the tongue every 5 (five) minutes as needed for chest pain. 25 tablet 3   omeprazole (PRILOSEC) 20   MG capsule Take 20 mg by mouth daily before breakfast.      ticagrelor (BRILINTA) 90 MG TABS tablet Take 1 tablet (90 mg total) by mouth 2 (two) times daily. 180 tablet 2   valsartan (DIOVAN) 320 MG tablet TAKE 1 TABLET(320 MG) BY MOUTH DAILY 90 tablet 1   No current facility-administered medications for this visit.    Allergies:   Plavix [clopidogrel bisulfate] and Adhesive [tape]    Social History:  The patient  reports that she quit smoking about 15 years ago. Her smoking use included cigarettes. She has a 29.00 pack-year smoking history. She has never used smokeless tobacco. She reports that she does not drink alcohol and does not use drugs.   Family History:  The patient's family history includes Cancer in her father; Diabetes in her daughter; Drug abuse in her brother and daughter; Heart disease in her daughter and paternal grandmother; Heart disease (age of onset: 62) in her mother; Hyperlipidemia in her brother and mother; Hypertension in her brother; Lupus in her daughter; Stroke in her paternal grandfather.    ROS:  Please see the history of present illness.   Otherwise, review of systems are positive for increasing angina.   All other systems are reviewed and negative.    PHYSICAL EXAM: VS:  BP 110/64   Pulse 74   Ht 5' 2.5" (1.588 m)   Wt 173 lb 3.2 oz (78.6 kg)   SpO2 92%   BMI 31.17 kg/m  , BMI Body mass index is 31.17 kg/m. GEN: Well  nourished, well developed, in no acute distress HEENT: normal Neck: no JVD, carotid bruits, or masses Cardiac: RRR; no murmurs, rubs, or gallops,no edema  Respiratory:  clear to auscultation bilaterally, normal work of breathing GI: soft, nontender, nondistended, + BS MS: no deformity or atrophy Skin: warm and dry, no rash Neuro:  Strength and sensation are intact Psych: euthymic mood, full affect   EKG:   The ekg ordered today demonstrates NSR, nonspecific ST changes   Recent Labs: 01/16/2021: BUN 13; Creatinine, Ser 0.79; Hemoglobin 11.9; Platelets 208; Potassium 3.8; Sodium 133   Lipid Panel    Component Value Date/Time   CHOL 126 01/06/2020 0835   CHOL 127 10/28/2014 0801   TRIG 149 01/06/2020 0835   TRIG 192 (H) 10/28/2014 0801   HDL 39 (L) 01/06/2020 0835   HDL 30 (L) 10/28/2014 0801   CHOLHDL 3.2 01/06/2020 0835   CHOLHDL 3.8 08/08/2016 0731   VLDL 22 08/08/2016 0731   LDLCALC 61 01/06/2020 0835   LDLCALC 59 10/28/2014 0801     Other studies Reviewed: Additional studies/ records that were reviewed today with results demonstrating: Prior cath films reviewed with the patient.   ASSESSMENT AND PLAN:  CAD/Old MI: Known CAD including posterolateral branch stenosis.  This is a small, tortuous vessel.  I tried to wire this in the past and was unsuccessful.  After her OM was stented the last time, she was angina free.  I suspect her current angina is more likely related to restenosis in the OM rather than anything in her right coronary system.  The other possibility is that she has a totally new lesion but given the timeframe since the stent was placed in the extremely tortuous OM, I think restenosis is most likely.  We will plan for repeat cardiac catheterization.  As noted in the past, there was lack of support in getting equipment down the tortuous vessel from the right radial approach. Hyperlipidemia: Check lipids  today.  Continue high-dose statin. DM: Check A1c today.   She does check at home but the A1c readings she gets on her home tests are lower than what we have gotten in the lab. Hypertensive heart disease: The current medical regimen is effective;  continue present plan and medications.    Current medicines are reviewed at length with the patient today.  The patient concerns regarding her medicines were addressed.  The following changes have been made:  No change  Labs/ tests ordered today include:   Orders Placed This Encounter  Procedures   CBC   Comp Met (CMET)   Lipid Profile   HgB A1c   EKG 12-Lead    Recommend 150 minutes/week of aerobic exercise Low fat, low carb, high fiber diet recommended  Disposition:   FU in for cath   Signed, Elai Vanwyk, MD  07/26/2021 10:09 AM    [facility] [address], [place], [place]  [address] Phone: [phone]; Fax: [phone]    

## 2021-07-26 ENCOUNTER — Other Ambulatory Visit: Payer: Self-pay

## 2021-07-26 ENCOUNTER — Ambulatory Visit: Payer: Medicare PPO | Admitting: Interventional Cardiology

## 2021-07-26 ENCOUNTER — Encounter: Payer: Self-pay | Admitting: Interventional Cardiology

## 2021-07-26 VITALS — BP 110/64 | HR 74 | Ht 62.5 in | Wt 173.2 lb

## 2021-07-26 DIAGNOSIS — I25118 Atherosclerotic heart disease of native coronary artery with other forms of angina pectoris: Secondary | ICD-10-CM

## 2021-07-26 DIAGNOSIS — I252 Old myocardial infarction: Secondary | ICD-10-CM

## 2021-07-26 DIAGNOSIS — E782 Mixed hyperlipidemia: Secondary | ICD-10-CM | POA: Diagnosis not present

## 2021-07-26 DIAGNOSIS — I119 Hypertensive heart disease without heart failure: Secondary | ICD-10-CM | POA: Diagnosis not present

## 2021-07-26 DIAGNOSIS — E1159 Type 2 diabetes mellitus with other circulatory complications: Secondary | ICD-10-CM | POA: Diagnosis not present

## 2021-07-26 LAB — COMPREHENSIVE METABOLIC PANEL
ALT: 27 IU/L (ref 0–32)
AST: 33 IU/L (ref 0–40)
Albumin/Globulin Ratio: 2 (ref 1.2–2.2)
Albumin: 4.3 g/dL (ref 3.8–4.8)
Alkaline Phosphatase: 83 IU/L (ref 44–121)
BUN/Creatinine Ratio: 20 (ref 12–28)
BUN: 17 mg/dL (ref 8–27)
Bilirubin Total: 0.4 mg/dL (ref 0.0–1.2)
CO2: 20 mmol/L (ref 20–29)
Calcium: 9.7 mg/dL (ref 8.7–10.3)
Chloride: 98 mmol/L (ref 96–106)
Creatinine, Ser: 0.84 mg/dL (ref 0.57–1.00)
Globulin, Total: 2.1 g/dL (ref 1.5–4.5)
Glucose: 138 mg/dL — ABNORMAL HIGH (ref 70–99)
Potassium: 4.8 mmol/L (ref 3.5–5.2)
Sodium: 137 mmol/L (ref 134–144)
Total Protein: 6.4 g/dL (ref 6.0–8.5)
eGFR: 76 mL/min/{1.73_m2} (ref >59)

## 2021-07-26 LAB — CBC
Hematocrit: 35.9 % (ref 34.0–46.6)
Hemoglobin: 12.4 g/dL (ref 11.1–15.9)
MCH: 30.9 pg (ref 26.6–33.0)
MCHC: 34.5 g/dL (ref 31.5–35.7)
MCV: 90 fL (ref 79–97)
Platelets: 250 10*3/uL (ref 150–450)
RBC: 4.01 x10E6/uL (ref 3.77–5.28)
RDW: 12.2 % (ref 11.7–15.4)
WBC: 8.1 10*3/uL (ref 3.4–10.8)

## 2021-07-26 LAB — LIPID PANEL
Chol/HDL Ratio: 3.3 ratio (ref 0.0–4.4)
Cholesterol, Total: 121 mg/dL (ref 100–199)
HDL: 37 mg/dL — ABNORMAL LOW (ref >39)
LDL Chol Calc (NIH): 59 mg/dL (ref 0–99)
Triglycerides: 145 mg/dL (ref 0–149)
VLDL Cholesterol Cal: 25 mg/dL (ref 5–40)

## 2021-07-26 LAB — HEMOGLOBIN A1C
Est. average glucose Bld gHb Est-mCnc: 131 mg/dL
Hgb A1c MFr Bld: 6.2 % — ABNORMAL HIGH (ref 4.8–5.6)

## 2021-07-26 NOTE — Patient Instructions (Addendum)
Medication Instructions:  Your physician recommends that you continue on your current medications as directed. Please refer to the Current Medication list given to you today.  *If you need a refill on your cardiac medications before your next appointment, please call your pharmacy*   Lab Work: Lab work to be done today--A1c, CBC, CMET, lipids If you have labs (blood work) drawn today and your tests are completely normal, you will receive your results only by: [facility] (if you have MyChart) OR A paper copy in the mail If you have any lab test that is abnormal or we need to change your treatment, we will call you to review the results.   Testing/Procedures: Your physician has requested that you have a cardiac catheterization. Cardiac catheterization is used to diagnose and/or treat various heart conditions. Doctors may recommend this procedure for a number of different reasons. The most common reason is to evaluate chest pain. Chest pain can be a symptom of coronary artery disease (CAD), and cardiac catheterization can show whether plaque is narrowing or blocking your heart's arteries. This procedure is also used to evaluate the valves, as well as measure the blood flow and oxygen levels in different parts of your heart. For further information please visit [email or web address]. Please follow instruction sheet, as given. Scheduled for 07/30/21   Follow-Up: At [facility], you and your health needs are our priority.  As part of our continuing mission to provide you with exceptional heart care, we have created designated Provider Care Teams.  These Care Teams include your primary Cardiologist (physician) and Advanced Practice Providers (APPs -  Physician Assistants and Nurse Practitioners) who all work together to provide you with the care you need, when you need it.  We recommend signing up for the patient portal called "MyChart".  Sign up information is provided on this After Visit  Summary.  MyChart is used to connect with patients for Virtual Visits (Telemedicine).  Patients are able to view lab/test results, encounter notes, upcoming appointments, etc.  Non-urgent messages can be sent to your provider as well.   To learn more about what you can do with MyChart, go to [email or web address].    Your next appointment:   To be arranged after procedure  The format for your next appointment:   In Person  Provider:   You may see Larae Grooms, MD or one of the following Advanced Practice Providers on your designated Care Team:   Melina Copa, PA-C Ermalinda Barrios, PA-C   Other Instructions   [facility] OFFICE [place], [place] [address] Dept: [phone] Loc: [place]  07/26/2021  You are scheduled for a Cardiac Catheterization on Friday, September 30 with Dr. Larae Grooms.  1. Please arrive at the [facility] (Main Entrance A) at [facility]: [address] [place], [place] [address] at 7:00 AM (This time is two hours before your procedure to ensure your preparation). Free valet parking service is available.   Special note: Every effort is made to have your procedure done on time. Please understand that emergencies sometimes delay scheduled procedures.  2. Diet: Do not eat solid foods after midnight.  The patient may have clear liquids until 5am upon the day of the procedure.  3. Labs: done in office on 9/26  4. Medication instructions in preparation for your procedure:  Do not take any diabetes morning the morning of the procedure. Do not  take Janumet for 48 hours after procedure.    Contrast Allergy: No   On the morning of your procedure, take your Brilinta/Ticagrelor and  Aspirin any morning medicines NOT listed above.  You may use sips of water.  5. Plan for one night stay--bring personal belongings. 6. Bring a  current list of your medications and current insurance cards. 7. You MUST have a responsible person to drive you home. 8. Someone MUST be with you the first 24 hours after you arrive home or your discharge will be delayed. 9. Please wear clothes that are easy to get on and off and wear slip-on shoes.  Thank you for allowing [place] to care for you!   -- [facility]

## 2021-07-29 ENCOUNTER — Telehealth: Payer: Self-pay | Admitting: *Deleted

## 2021-07-29 NOTE — Telephone Encounter (Addendum)
Cardiac catheterization scheduled at [facility] for: Friday July 30, 2021 [facility] Main Entrance A [facility]) at:7 AM   No solid food after midnight prior to cath, clear liquids until 5 AM day of procedure.  Medication instructions: Hold: Janumet-day of procedure and 48 hours post procedure  Except hold medications usual morning medications can be taken pre-cath with sips of water including: -aspirin 81 mg -Brilinta 90 mg    Confirmed patient has responsible adult to drive home post procedure and be with patient first 24 hours after arriving home.  [facility] does allow one visitor to accompany you and wait in the hospital waiting room while you are there for your procedure. You and your visitor will be asked to wear a mask once you enter the hospital.   Patient reports does not currently have any symptoms concerning for COVID-19 and no household members with COVID-19 like illness.                         Reviewed procedure/mask/visitor instructions with patient.

## 2021-07-30 ENCOUNTER — Ambulatory Visit (HOSPITAL_COMMUNITY)
Admission: RE | Disposition: A | Discharge status: Home / Self Care | Payer: Self-pay | Source: Home / Self Care | Attending: Interventional Cardiology

## 2021-07-30 ENCOUNTER — Ambulatory Visit (HOSPITAL_COMMUNITY)
Admission: RE | Admit: 2021-07-30 | Discharge: 2021-07-31 | Disposition: A | Discharge status: Home / Self Care | Payer: Medicare PPO | Attending: Interventional Cardiology | Admitting: Interventional Cardiology

## 2021-07-30 ENCOUNTER — Other Ambulatory Visit: Payer: Self-pay

## 2021-07-30 ENCOUNTER — Encounter (HOSPITAL_COMMUNITY): Payer: Self-pay | Admitting: Interventional Cardiology

## 2021-07-30 DIAGNOSIS — Z888 Allergy status to other drugs, medicaments and biological substances status: Secondary | ICD-10-CM | POA: Diagnosis not present

## 2021-07-30 DIAGNOSIS — I2510 Atherosclerotic heart disease of native coronary artery without angina pectoris: Secondary | ICD-10-CM | POA: Diagnosis present

## 2021-07-30 DIAGNOSIS — K219 Gastro-esophageal reflux disease without esophagitis: Secondary | ICD-10-CM | POA: Insufficient documentation

## 2021-07-30 DIAGNOSIS — Z87891 Personal history of nicotine dependence: Secondary | ICD-10-CM | POA: Insufficient documentation

## 2021-07-30 DIAGNOSIS — Z79899 Other long term (current) drug therapy: Secondary | ICD-10-CM | POA: Insufficient documentation

## 2021-07-30 DIAGNOSIS — Z7982 Long term (current) use of aspirin: Secondary | ICD-10-CM | POA: Diagnosis not present

## 2021-07-30 DIAGNOSIS — E785 Hyperlipidemia, unspecified: Secondary | ICD-10-CM | POA: Insufficient documentation

## 2021-07-30 DIAGNOSIS — I25118 Atherosclerotic heart disease of native coronary artery with other forms of angina pectoris: Secondary | ICD-10-CM | POA: Insufficient documentation

## 2021-07-30 DIAGNOSIS — Z8249 Family history of ischemic heart disease and other diseases of the circulatory system: Secondary | ICD-10-CM | POA: Insufficient documentation

## 2021-07-30 DIAGNOSIS — E669 Obesity, unspecified: Secondary | ICD-10-CM | POA: Diagnosis not present

## 2021-07-30 DIAGNOSIS — E1169 Type 2 diabetes mellitus with other specified complication: Secondary | ICD-10-CM | POA: Diagnosis present

## 2021-07-30 DIAGNOSIS — Z6831 Body mass index (BMI) 31.0-31.9, adult: Secondary | ICD-10-CM | POA: Insufficient documentation

## 2021-07-30 DIAGNOSIS — Z833 Family history of diabetes mellitus: Secondary | ICD-10-CM | POA: Diagnosis not present

## 2021-07-30 DIAGNOSIS — E119 Type 2 diabetes mellitus without complications: Secondary | ICD-10-CM | POA: Insufficient documentation

## 2021-07-30 DIAGNOSIS — I119 Hypertensive heart disease without heart failure: Secondary | ICD-10-CM | POA: Diagnosis not present

## 2021-07-30 DIAGNOSIS — I252 Old myocardial infarction: Secondary | ICD-10-CM | POA: Insufficient documentation

## 2021-07-30 DIAGNOSIS — Z7984 Long term (current) use of oral hypoglycemic drugs: Secondary | ICD-10-CM | POA: Diagnosis not present

## 2021-07-30 DIAGNOSIS — I10 Essential (primary) hypertension: Secondary | ICD-10-CM | POA: Diagnosis present

## 2021-07-30 HISTORY — PX: CORONARY BALLOON ANGIOPLASTY: CATH118233

## 2021-07-30 HISTORY — PX: LEFT HEART CATH AND CORONARY ANGIOGRAPHY: CATH118249

## 2021-07-30 LAB — GLUCOSE, CAPILLARY
Glucose-Capillary: 149 mg/dL — ABNORMAL HIGH (ref 70–99)
Glucose-Capillary: 122 mg/dL — ABNORMAL HIGH (ref 70–99)
Glucose-Capillary: 185 mg/dL — ABNORMAL HIGH (ref 70–99)

## 2021-07-30 LAB — POCT ACTIVATED CLOTTING TIME
Activated Clotting Time: 202 seconds
Activated Clotting Time: 231 seconds
Activated Clotting Time: 237 seconds

## 2021-07-30 SURGERY — LEFT HEART CATH AND CORONARY ANGIOGRAPHY
Anesthesia: LOCAL

## 2021-07-30 MED ORDER — HEPARIN SODIUM (PORCINE) 1000 UNIT/ML IJ SOLN
INTRAMUSCULAR | Status: DC | PRN
  Administered 2021-07-30: 1000 [IU] via INTRAVENOUS
  Administered 2021-07-30: 7000 [IU] via INTRAVENOUS
  Administered 2021-07-30: 2000 [IU] via INTRAVENOUS

## 2021-07-30 MED ORDER — TICAGRELOR 90 MG PO TABS: 90.0000 mg | ORAL_TABLET | Freq: Two times a day (BID) | ORAL | Status: DC

## 2021-07-30 MED ORDER — SODIUM CHLORIDE 0.9 % IV SOLN: INTRAVENOUS | Status: AC

## 2021-07-30 MED ORDER — TICAGRELOR 90 MG PO TABS: 90.0000 mg | ORAL_TABLET | ORAL | Status: DC

## 2021-07-30 MED ORDER — LABETALOL HCL 5 MG/ML IV SOLN: 10.0000 mg | INTRAVENOUS | Status: AC | PRN

## 2021-07-30 MED ORDER — LIDOCAINE HCL (PF) 1 % IJ SOLN
INTRAMUSCULAR | Status: AC
  Filled 2021-07-30: qty 30

## 2021-07-30 MED ORDER — SODIUM CHLORIDE 0.9 % IV SOLN
INTRAVENOUS | Status: AC | PRN
  Administered 2021-07-30: 250 mL via INTRAVENOUS

## 2021-07-30 MED ORDER — MIDAZOLAM HCL 2 MG/2ML IJ SOLN
INTRAMUSCULAR | Status: AC
  Filled 2021-07-30: qty 2

## 2021-07-30 MED ORDER — ACETAMINOPHEN 325 MG PO TABS: 650.0000 mg | ORAL_TABLET | ORAL | Status: DC | PRN

## 2021-07-30 MED ORDER — HYDRALAZINE HCL 20 MG/ML IJ SOLN: 10.0000 mg | INTRAMUSCULAR | Status: AC | PRN

## 2021-07-30 MED ORDER — PANTOPRAZOLE SODIUM 40 MG PO TBEC
40.0000 mg | DELAYED_RELEASE_TABLET | Freq: Every day | ORAL | Status: DC
  Administered 2021-07-31: 40 mg via ORAL
  Filled 2021-07-30: qty 1

## 2021-07-30 MED ORDER — NITROGLYCERIN 1 MG/10 ML FOR IR/CATH LAB
INTRA_ARTERIAL | Status: AC
  Filled 2021-07-30: qty 10

## 2021-07-30 MED ORDER — SODIUM CHLORIDE 0.9 % WEIGHT BASED INFUSION: 1.0000 mL/kg/h | INTRAVENOUS | Status: DC

## 2021-07-30 MED ORDER — SODIUM CHLORIDE 0.9% FLUSH: 3.0000 mL | INTRAVENOUS | Status: DC | PRN

## 2021-07-30 MED ORDER — MELATONIN 5 MG PO TABS: 5.0000 mg | ORAL_TABLET | Freq: Every evening | ORAL | Status: DC | PRN

## 2021-07-30 MED ORDER — SODIUM CHLORIDE 0.9% FLUSH
3.0000 mL | Freq: Two times a day (BID) | INTRAVENOUS | Status: DC
  Administered 2021-07-30 – 2021-07-31 (×2): 3 mL via INTRAVENOUS

## 2021-07-30 MED ORDER — HEPARIN (PORCINE) IN NACL 1000-0.9 UT/500ML-% IV SOLN
INTRAVENOUS | Status: DC | PRN
  Administered 2021-07-30 (×2): 500 mL

## 2021-07-30 MED ORDER — ASPIRIN 81 MG PO CHEW
81.0000 mg | CHEWABLE_TABLET | Freq: Every day | ORAL | Status: DC
  Administered 2021-07-31: 81 mg via ORAL
  Filled 2021-07-30: qty 1

## 2021-07-30 MED ORDER — SODIUM CHLORIDE 0.9 % IV SOLN: 250.0000 mL | INTRAVENOUS | Status: DC | PRN

## 2021-07-30 MED ORDER — TICAGRELOR 90 MG PO TABS
90.0000 mg | ORAL_TABLET | Freq: Two times a day (BID) | ORAL | Status: DC
  Administered 2021-07-30 – 2021-07-31 (×2): 90 mg via ORAL
  Filled 2021-07-30 (×2): qty 1

## 2021-07-30 MED ORDER — LIDOCAINE HCL (PF) 1 % IJ SOLN
INTRAMUSCULAR | Status: DC | PRN
  Administered 2021-07-30: 10 mL

## 2021-07-30 MED ORDER — SODIUM CHLORIDE 0.9% FLUSH
3.0000 mL | Freq: Two times a day (BID) | INTRAVENOUS | Status: DC
  Administered 2021-07-30: 3 mL via INTRAVENOUS

## 2021-07-30 MED ORDER — ASPIRIN 81 MG PO CHEW: 81.0000 mg | CHEWABLE_TABLET | ORAL | Status: DC

## 2021-07-30 MED ORDER — FENTANYL CITRATE (PF) 100 MCG/2ML IJ SOLN
INTRAMUSCULAR | Status: AC
  Filled 2021-07-30: qty 2

## 2021-07-30 MED ORDER — ONDANSETRON HCL 4 MG/2ML IJ SOLN: 4.0000 mg | Freq: Four times a day (QID) | INTRAMUSCULAR | Status: DC | PRN

## 2021-07-30 MED ORDER — IOHEXOL 350 MG/ML SOLN
INTRAVENOUS | Status: DC | PRN
  Administered 2021-07-30: 105 mL

## 2021-07-30 MED ORDER — SODIUM CHLORIDE 0.9 % WEIGHT BASED INFUSION
3.0000 mL/kg/h | INTRAVENOUS | Status: DC
  Administered 2021-07-30: 3 mL/kg/h via INTRAVENOUS

## 2021-07-30 MED ORDER — ISOSORBIDE MONONITRATE ER 60 MG PO TB24
60.0000 mg | ORAL_TABLET | Freq: Every morning | ORAL | Status: DC
  Administered 2021-07-31: 60 mg via ORAL
  Filled 2021-07-30: qty 1

## 2021-07-30 MED ORDER — HEPARIN (PORCINE) IN NACL 1000-0.9 UT/500ML-% IV SOLN
INTRAVENOUS | Status: AC
  Filled 2021-07-30: qty 1000

## 2021-07-30 MED ORDER — ATORVASTATIN CALCIUM 80 MG PO TABS
80.0000 mg | ORAL_TABLET | Freq: Every day | ORAL | Status: DC
  Administered 2021-07-30 – 2021-07-31 (×2): 80 mg via ORAL
  Filled 2021-07-30 (×2): qty 1

## 2021-07-30 MED ORDER — EZETIMIBE 10 MG PO TABS
10.0000 mg | ORAL_TABLET | Freq: Every day | ORAL | Status: DC
  Administered 2021-07-30: 10 mg via ORAL
  Filled 2021-07-30: qty 1

## 2021-07-30 MED ORDER — FENTANYL CITRATE (PF) 100 MCG/2ML IJ SOLN
INTRAMUSCULAR | Status: DC | PRN
  Administered 2021-07-30 (×3): 25 ug via INTRAVENOUS

## 2021-07-30 MED ORDER — NITROGLYCERIN 0.4 MG SL SUBL: 0.4000 mg | SUBLINGUAL_TABLET | SUBLINGUAL | Status: DC | PRN

## 2021-07-30 MED ORDER — METOPROLOL SUCCINATE ER 25 MG PO TB24
25.0000 mg | ORAL_TABLET | Freq: Every day | ORAL | Status: DC
  Administered 2021-07-31: 25 mg via ORAL
  Filled 2021-07-30: qty 1

## 2021-07-30 MED ORDER — IRBESARTAN 75 MG PO TABS
75.0000 mg | ORAL_TABLET | Freq: Every day | ORAL | Status: DC
  Administered 2021-07-30 – 2021-07-31 (×2): 75 mg via ORAL
  Filled 2021-07-30 (×2): qty 1

## 2021-07-30 MED ORDER — AMLODIPINE BESYLATE 5 MG PO TABS
5.0000 mg | ORAL_TABLET | Freq: Two times a day (BID) | ORAL | Status: DC
  Administered 2021-07-30 – 2021-07-31 (×2): 5 mg via ORAL
  Filled 2021-07-30 (×2): qty 1

## 2021-07-30 MED ORDER — MIDAZOLAM HCL 2 MG/2ML IJ SOLN
INTRAMUSCULAR | Status: DC | PRN
  Administered 2021-07-30 (×3): 1 mg via INTRAVENOUS

## 2021-07-30 MED ORDER — DIPHENHYDRAMINE HCL 25 MG PO CAPS: 50.0000 mg | ORAL_CAPSULE | Freq: Every day | ORAL | Status: DC

## 2021-07-30 MED ORDER — VERAPAMIL HCL 2.5 MG/ML IV SOLN
INTRAVENOUS | Status: AC
  Filled 2021-07-30: qty 2

## 2021-07-30 MED ORDER — HEPARIN SODIUM (PORCINE) 1000 UNIT/ML IJ SOLN
INTRAMUSCULAR | Status: AC
  Filled 2021-07-30: qty 1

## 2021-07-30 MED ORDER — ASPIRIN 81 MG PO CHEW: 81.0000 mg | CHEWABLE_TABLET | Freq: Every day | ORAL | Status: DC

## 2021-07-30 MED ORDER — ADULT MULTIVITAMIN W/MINERALS CH
1.0000 | ORAL_TABLET | Freq: Every day | ORAL | Status: DC
  Administered 2021-07-31: 1 via ORAL
  Filled 2021-07-30: qty 1

## 2021-07-30 SURGICAL SUPPLY — 20 items
BALLN SAPPHIRE ~~LOC~~ 2.75X12 (BALLOONS) ×2 IMPLANT
BALLN SAPPHIRE ~~LOC~~ 3.0X12 (BALLOONS) ×2 IMPLANT
BALLN SCOREFLEX 2.50X15 (BALLOONS) ×2
BALLOON SCOREFLEX 2.50X15 (BALLOONS) ×1 IMPLANT
CATH INFINITI 5FR JL5 (CATHETERS) ×2 IMPLANT
CATH INFINITI 5FR MULTPACK ANG (CATHETERS) ×2 IMPLANT
CATH LAUNCHER 6FR EBU 4 (CATHETERS) ×2 IMPLANT
CATH OPTICROSS HD (CATHETERS) ×2 IMPLANT
CLOSURE MYNX CONTROL 6F/7F (Vascular Products) ×2 IMPLANT
KIT ENCORE 26 ADVANTAGE (KITS) ×2 IMPLANT
KIT HEART LEFT (KITS) ×2 IMPLANT
PACK CARDIAC CATHETERIZATION (CUSTOM PROCEDURE TRAY) ×2 IMPLANT
SHEATH PINNACLE 5F 10CM (SHEATH) ×2 IMPLANT
SHEATH PINNACLE 6F 10CM (SHEATH) ×2 IMPLANT
SHEATH PROBE COVER 6X72 (BAG) ×2 IMPLANT
SLED PULL BACK IVUS (MISCELLANEOUS) ×2 IMPLANT
TRANSDUCER W/STOPCOCK (MISCELLANEOUS) ×2 IMPLANT
TUBING CIL FLEX 10 FLL-RA (TUBING) ×2 IMPLANT
WIRE ASAHI PROWATER 180CM (WIRE) ×2 IMPLANT
WIRE J 3MM .035X145CM (WIRE) ×2 IMPLANT

## 2021-07-30 NOTE — Interval H&P Note (Signed)
Cath Lab Visit (complete for each Cath Lab visit)  Clinical Evaluation Leading to the Procedure:   ACS: No.  Non-ACS:    Anginal Classification: CCS III  Anti-ischemic medical therapy: Maximal Therapy (2 or more classes of medications)  Non-Invasive Test Results: No non-invasive testing performed  Prior CABG: No previous CABG      History and Physical Interval Note:  07/30/2021 8:52 AM  Suzanne Watkins  has presented today for surgery, with the diagnosis of chest pain.  The various methods of treatment have been discussed with the patient and family. After consideration of risks, benefits and other options for treatment, the patient has consented to  Procedure(s): LEFT HEART CATH AND CORONARY ANGIOGRAPHY (N/A) as a surgical intervention.  The patient's history has been reviewed, patient examined, no change in status, stable for surgery.  I have reviewed the patient's chart and labs.  Questions were answered to the patient's satisfaction.     Larae Grooms

## 2021-07-30 NOTE — Progress Notes (Signed)
CCMD reported that pt had a 5 beat run of Vtach. Pt noted to be asymptomatic. Sharolyn Douglas NP notified.

## 2021-07-31 DIAGNOSIS — I2583 Coronary atherosclerosis due to lipid rich plaque: Secondary | ICD-10-CM

## 2021-07-31 DIAGNOSIS — I10 Essential (primary) hypertension: Secondary | ICD-10-CM | POA: Diagnosis not present

## 2021-07-31 DIAGNOSIS — E119 Type 2 diabetes mellitus without complications: Secondary | ICD-10-CM | POA: Diagnosis not present

## 2021-07-31 DIAGNOSIS — E7800 Pure hypercholesterolemia, unspecified: Secondary | ICD-10-CM

## 2021-07-31 DIAGNOSIS — E785 Hyperlipidemia, unspecified: Secondary | ICD-10-CM | POA: Diagnosis not present

## 2021-07-31 DIAGNOSIS — I119 Hypertensive heart disease without heart failure: Secondary | ICD-10-CM | POA: Diagnosis not present

## 2021-07-31 DIAGNOSIS — I2510 Atherosclerotic heart disease of native coronary artery without angina pectoris: Secondary | ICD-10-CM | POA: Diagnosis not present

## 2021-07-31 DIAGNOSIS — I25118 Atherosclerotic heart disease of native coronary artery with other forms of angina pectoris: Secondary | ICD-10-CM | POA: Diagnosis not present

## 2021-07-31 LAB — BASIC METABOLIC PANEL
Anion gap: 8 (ref 5–15)
BUN: 13 mg/dL (ref 8–23)
CO2: 20 mmol/L — ABNORMAL LOW (ref 22–32)
Calcium: 8.9 mg/dL (ref 8.9–10.3)
Chloride: 108 mmol/L (ref 98–111)
Creatinine, Ser: 0.82 mg/dL (ref 0.44–1.00)
GFR, Estimated: >60 mL/min (ref >60)
Glucose, Bld: 164 mg/dL — ABNORMAL HIGH (ref 70–99)
Potassium: 4.3 mmol/L (ref 3.5–5.1)
Sodium: 136 mmol/L (ref 135–145)

## 2021-07-31 LAB — CBC
HCT: 34.3 % — ABNORMAL LOW (ref 36.0–46.0)
Hemoglobin: 11.6 g/dL — ABNORMAL LOW (ref 12.0–15.0)
MCH: 31.3 pg (ref 26.0–34.0)
MCHC: 33.8 g/dL (ref 30.0–36.0)
MCV: 92.5 fL (ref 80.0–100.0)
Platelets: 184 10*3/uL (ref 150–400)
RBC: 3.71 MIL/uL — ABNORMAL LOW (ref 3.87–5.11)
RDW: 13 % (ref 11.5–15.5)
WBC: 10.8 10*3/uL — ABNORMAL HIGH (ref 4.0–10.5)
nRBC: 0 % (ref 0.0–0.2)

## 2021-07-31 LAB — GLUCOSE, CAPILLARY
Glucose-Capillary: 123 mg/dL — ABNORMAL HIGH (ref 70–99)
Glucose-Capillary: 143 mg/dL — ABNORMAL HIGH (ref 70–99)

## 2021-07-31 NOTE — Progress Notes (Addendum)
Progress Note  Patient Name: Suzanne Watkins Date of Encounter: 07/31/2021  [facility] Cardiologist: Larae Grooms, MD   Subjective   Denies any chest pain or SOB.  Cath yesterday with restenosis of OM1 stent 95%, 50% PDA and 80% RPLA with patent stents in the mLAD, prox and distal RCA s/p PCI of the OM1 stent.  LVF normal.   Inpatient Medications    Scheduled Meds:  amLODipine  5 mg Oral BID   aspirin  81 mg Oral Daily   atorvastatin  80 mg Oral Daily   ezetimibe  10 mg Oral QHS   irbesartan  75 mg Oral Daily   isosorbide mononitrate  60 mg Oral q AM   metoprolol succinate  25 mg Oral Daily   multivitamin with minerals  1 tablet Oral Daily   pantoprazole  40 mg Oral Daily   sodium chloride flush  3 mL Intravenous Q12H   sodium chloride flush  3 mL Intravenous Q12H   ticagrelor  90 mg Oral BID   Continuous Infusions:  sodium chloride     PRN Meds: sodium chloride, acetaminophen, melatonin, nitroGLYCERIN, ondansetron (ZOFRAN) IV, sodium chloride flush   Vital Signs    Vitals:   07/30/21 2104 07/30/21 2354 07/31/21 0343 07/31/21 0728  BP:  120/66 131/70 134/75  Pulse: 74 83 80 78  Resp: (!) 21 17 18 18   Temp:  99.1 F (37.3 C) 99.3 F (37.4 C) 97.6 F (36.4 C)  TempSrc:  Oral Oral Oral  SpO2: 95% 96% 99% 99%  Weight:      Height:        Intake/Output Summary (Last 24 hours) at 07/31/2021 0836 Last data filed at 07/30/2021 2100 Gross per 24 hour  Intake 613.84 ml  Output --  Net 613.84 ml   Last 3 Weights 07/30/2021 07/26/2021 01/22/2021  Weight (lbs) 173 lb 6.4 oz 173 lb 3.2 oz 175 lb 6.4 oz  Weight (kg) 78.654 kg 78.563 kg 79.561 kg      Telemetry    NSR - Personally Reviewed  ECG    No new EKG to review - Personally Reviewed  Physical Exam   GEN: No acute distress.   Neck: No JVD Cardiac: RRR, no murmurs, rubs, or gallops.  Respiratory: Clear to auscultation bilaterally. GI: Soft, nontender, non-distended  MS: No edema; No  deformity. Neuro:  Nonfocal  Psych: Normal affect   Labs    High Sensitivity Troponin:  No results for input(s): TROPONINIHS in the last 720 hours.    Chemistry Recent Labs  Lab 07/26/21 1017 07/31/21 0207  NA 137 136  K 4.8 4.3  CL 98 108  CO2 20 20*  GLUCOSE 138* 164*  BUN 17 13  CREATININE 0.84 0.82  CALCIUM 9.7 8.9  PROT 6.4  --   ALBUMIN 4.3  --   AST 33  --   ALT 27  --   ALKPHOS 83  --   BILITOT 0.4  --   GFRNONAA  --  >60  ANIONGAP  --  8     Hematology Recent Labs  Lab 07/26/21 1017 07/31/21 0207  WBC 8.1 10.8*  RBC 4.01 3.71*  HGB 12.4 11.6*  HCT 35.9 34.3*  MCV 90 92.5  MCH 30.9 31.3  MCHC 34.5 33.8  RDW 12.2 13.0  PLT 250 184    BNPNo results for input(s): BNP, PROBNP in the last 168 hours.   DDimer No results for input(s): DDIMER in the last 168 hours.  Radiology    CARDIAC CATHETERIZATION  Addendum Date: 07/30/2021     2nd RPL lesion is 80% stenosed. There is TIMI 3 flow in this small vessel. It is unchanged from prior.   RPDA lesion is 50% stenosed.   1st Mrg-proximal lesion is 95% stenosed- instent restenosis.  Scoring balloon angioplasty was performed using a BALLN SCOREFLEX 2.50X15,followed by PTCA with 3.0 mm [place] balloon.   Post intervention, there is a 0% residual stenosis.   1st Mrg- lesion is 80% stenosed distali in stent restenosis in distal stent.  Scoring balloon angioplasty was performed using a BALLN SCOREFLEX 2.50X15, followed by PTCA with 2.75 mm [place] balloon.   Post intervention, there is a 10% residual stenosis.   Non-stenotic Mid LAD lesion was previously treated.   Non-stenotic Prox RCA lesion was previously treated.   Non-stenotic Mid RCA to Dist RCA lesion was previously treated.   Non-stenotic 1st Mrg-1 lesion was previously treated.   The left ventricular systolic function is normal.   LV end diastolic pressure is normal.   The left ventricular ejection fraction is 55-65% by visual estimate.   There is no aortic valve  stenosis. Continue aggressive secondary prevention.   Plan for discharge in AM.  BP borderline during cath.  Decreasing dosages of BP meds for now.  Likely can return to baseline doses at discharge. If she has restenosis again in the same area proximally, would consider dilatation with a shockwave balloon given 180 degree arc of calcium behind stented segment.   Result Date: 07/30/2021   2nd RPL lesion is 80% stenosed. There is TIMI 3 flow in this small vessel. It is unchanged from prior.   RPDA lesion is 50% stenosed.   1st Mrg-proximal lesion is 95% stenosed- instent restenosis.  Scoring balloon angioplasty was performed using a BALLN SCOREFLEX 2.50X15,followed by PTCA with 3.0 mm [place] balloon.   Post intervention, there is a 0% residual stenosis.   1st Mrg- lesion is 80% stenosed distali in stent restenosis in distal stent.  Scoring balloon angioplasty was performed using a BALLN SCOREFLEX 2.50X15, followed by PTCA with 2.75 mm [place] balloon.   Post intervention, there is a 10% residual stenosis.   Non-stenotic Mid LAD lesion was previously treated.   Non-stenotic Prox RCA lesion was previously treated.   Non-stenotic Mid RCA to Dist RCA lesion was previously treated.   Non-stenotic 1st Mrg-1 lesion was previously treated.   The left ventricular systolic function is normal.   LV end diastolic pressure is normal.   The left ventricular ejection fraction is 55-65% by visual estimate.   There is no aortic valve stenosis. Continue aggressive secondary prevention.   Plan for discharge in AM.  BP borderline during cath.  Decreasing dosages of BP meds for now.  Likely can return to baseline doses at discharge. If she has restenosis again in the same area proximally, would consider dilatation with a shockwave balloon given 180 degree arc of calcium behind stented segment.    Cardiac Studies   Cardiac Cath 07/30/2021 Diagnostic Dominance: Right Intervention    Patient Profile     70 y.o. female with hx of ASCAD  s/p PCI of the mLAD, OM1, prox and distal RCA remotely, HLD, DM and HTN  admitted for cath due to exertional angina.    Assessment & Plan    ASCAD -s/p remote hx of PCIs of the mLAD, prox and distal RCA and OM1 -admitted with exertional angina -cath this admit with 95% in stent restenosis  of OM1 stent s/p PCI -continue ASA 81mg  daily, Brilinta 90mg  BID, BB, CCB, Imdur 60mg  daily  and statin  2.  HTN -Bp controlled on exam -continue amlodipine 5mg  daily, Toprol XL 25mg  daily and Valsartan 320mg  daily -SCr stable at 0.82 today  3.  HLD -LDL goal < 70 -LDL was 59 this admit -continue Atorvastatin 80mg  daily and Zetia 10mg  daily  4.  DM -HbA1C 6.2% last month -restart Janumet on Sunday 10/2 -followup with PCP  She is stable for discharge home today.  Will plan TOC in 7-10 days.    For questions or updates, please contact [place] Please consult [email or web address] for contact info under        Signed, Fransico Him, MD  07/31/2021, 8:36 AM

## 2021-07-31 NOTE — Plan of Care (Signed)

## 2021-07-31 NOTE — Discharge Summary (Signed)
Discharge Summary    Patient ID: Suzanne Watkins MRN: [id number]; DOB: May 18, 1951  Admit date: 07/30/2021 Discharge date: 07/31/2021  PCP:  Maximiano Coss, NP   [facility] Providers Cardiologist:  Larae Grooms, MD      Discharge Diagnoses    Principal Problem:   CAD (coronary artery disease) Active Problems:   Diabetes ([place])   Hypertension   Hyperlipidemia   GERD (gastroesophageal reflux disease)   Obesity   History of tobacco use    Diagnostic Studies/Procedures    Cath 07/30/2021   2nd RPL lesion is 80% stenosed. There is TIMI 3 flow in this small vessel. It is unchanged from prior.   RPDA lesion is 50% stenosed.   1st Mrg-proximal lesion is 95% stenosed- instent restenosis.  Scoring balloon angioplasty was performed using a BALLN SCOREFLEX 2.50X15,followed by PTCA with 3.0 mm [place] balloon.   Post intervention, there is a 0% residual stenosis.   1st Mrg- lesion is 80% stenosed distali in stent restenosis in distal stent.  Scoring balloon angioplasty was performed using a BALLN SCOREFLEX 2.50X15, followed by PTCA with 2.75 mm  balloon.   Post intervention, there is a 10% residual stenosis.   Non-stenotic Mid LAD lesion was previously treated.   Non-stenotic Prox RCA lesion was previously treated.   Non-stenotic Mid RCA to Dist RCA lesion was previously treated.   Non-stenotic 1st Mrg-1 lesion was previously treated.   The left ventricular systolic function is normal.   LV end diastolic pressure is normal.   The left ventricular ejection fraction is 55-65% by visual estimate.   There is no aortic valve stenosis.   Continue aggressive secondary prevention.   Plan for discharge in AM.  BP borderline during cath.  Decreasing dosages of BP meds for now.  Likely can return to baseline doses at discharge.    If she has restenosis again in the same area proximally, would consider dilatation with a shockwave balloon given 180 degree arc of calcium behind stented  segment.  _____________   History of Present Illness     Suzanne Watkins is a 70 y.o. female with PMH of CAD.  She has had stents in all three coronary vessels.  Most recent were in 11/14.   She used to walk a lot on the [employer]  She retired in Jan 2019, so she joined a senior center to exercise on the treadmill.   She had a broken leg in 11/19.  She had surgery in December 12/19.  She did PT.  No cardiac issues at that time.    She had some SHOB in Jan 2021.  She has some intermittent tachycardia, when she checks her pulse ox.  CT scan: done showed no PE, mild emphysema.   In 10/21, she had an episode of left arm pain with SHOB.  She used a SL NTG and had some relief.  She had one episode of chest tightness again and used SL NTG.     In 2021, Imdur was added and amlodipine was increased to 5 mg BID. BP improved.   The patient does not have symptoms concerning for COVID-19 infection (fever, chills, cough, or new shortness of breath).    2021 cath resulted in PTCA of OM, unable to advance stent due to tortuosity from the radial approach.    She had recurrent angina. Cath in 12/2020 showed: "Non-stenotic Mid LAD lesion was previously treated. Proximal OM1 stent was patent. Mid OM1 lesion is 90% stenosed. Tortuous vessel and  area of severe disease. A drug-eluting stent was successfully placed using a STENT RESOLUTE ONYX 2.25X34, postdilated to > 2.5 mm. Post intervention, there is a 0% residual stenosis. RCA stents are patent. 2nd RPL lesion is 80% stenosed. RPDA lesion is 50% stenosed. The left ventricular systolic function is normal. LV end diastolic pressure is normal. The left ventricular ejection fraction is 55-65% by visual estimate. There is no aortic valve stenosis. Femoral approach allowed for much more support than prior radial attempt.   Continue aggressive secondary prevention.  Dual antiplatelet therapy for 6 months.  Watch overnight and plan for home tomorrow. "    Over the last few weeks, she is having more exertional angina.  We changed Imdur to 60 mg BID.  She felt she had side effects with the 60 mg BID.  She instead took 90 mg in the AM and 30 mg in the PM.     She has had her flu shot and omicron booster.  Hospital Course     Consultants: N/A   Patient was seen by Dr. Irish Lack on 11/25/2020 at which time she complained of increasing anginal symptoms.  Outpatient cardiac catheterization was scheduled for 07/30/2021 which revealed 80% second RPL lesion unchanged from the previous cath, 95% proximal OM1 in-stent restenosis treated with scoring balloon angioplasty, 80% distal OM1 in-stent restenosis also treated with scoring balloon angioplasty, EF 55 to 65%.  Patient was seen on the following morning on 07/31/2021 by Dr. Radford Pax at which time she was doing well without significant chest pain or shortness of breath.  She is currently on aspirin, Brilinta, beta-blocker, calcium channel blocker and Imdur along with a statin.  Outpatient 7 to 10 days TOC follow-up has been recommended, message has been sent to our scheduler to arrange follow up.    Did the patient have an acute coronary syndrome (MI, NSTEMI, STEMI, etc) this admission?:  No                               Did the patient have a percutaneous coronary intervention (stent / angioplasty)?:  Yes.     Cath/PCI Registry Performance & Quality Measures: Aspirin prescribed? - Yes ADP Receptor Inhibitor (Plavix/Clopidogrel, Brilinta/Ticagrelor or Effient/Prasugrel) prescribed (includes medically managed patients)? - Yes High Intensity Statin (Lipitor 40-80mg  or Crestor 20-40mg ) prescribed? - Yes For EF <40%, was ACEI/ARB prescribed? - Yes For EF <40%, Aldosterone Antagonist (Spironolactone or Eplerenone) prescribed? - Not Applicable (EF >/= 22%) Cardiac Rehab Phase II ordered? - Yes      _____________  Discharge Vitals Blood pressure 134/75, pulse 78, temperature 97.6 F (36.4 C), temperature  source Oral, resp. rate 18, height 5' 2.5" (1.588 m), weight 78.7 kg, SpO2 99 %.  Filed Weights   07/30/21 0732  Weight: 78.7 kg    Labs & Radiologic Studies    CBC Recent Labs    07/31/21 0207  WBC 10.8*  HGB 11.6*  HCT 34.3*  MCV 92.5  PLT 025   Basic Metabolic Panel Recent Labs    07/31/21 0207  NA 136  K 4.3  CL 108  CO2 20*  GLUCOSE 164*  BUN 13  CREATININE 0.82  CALCIUM 8.9   Liver Function Tests No results for input(s): AST, ALT, ALKPHOS, BILITOT, PROT, ALBUMIN in the last 72 hours. No results for input(s): LIPASE, AMYLASE in the last 72 hours. High Sensitivity Troponin:   No results for input(s): TROPONINIHS in the last 720  hours.  BNP Invalid input(s): POCBNP D-Dimer No results for input(s): DDIMER in the last 72 hours. Hemoglobin A1C No results for input(s): HGBA1C in the last 72 hours. Fasting Lipid Panel No results for input(s): CHOL, HDL, LDLCALC, TRIG, CHOLHDL, LDLDIRECT in the last 72 hours. Thyroid Function Tests No results for input(s): TSH, T4TOTAL, T3FREE, THYROIDAB in the last 72 hours.  Invalid input(s): FREET3 _____________  CARDIAC CATHETERIZATION  Addendum Date: 07/30/2021     2nd RPL lesion is 80% stenosed. There is TIMI 3 flow in this small vessel. It is unchanged from prior.   RPDA lesion is 50% stenosed.   1st Mrg-proximal lesion is 95% stenosed- instent restenosis.  Scoring balloon angioplasty was performed using a BALLN SCOREFLEX 2.50X15,followed by PTCA with 3.0 mm [place] balloon.   Post intervention, there is a 0% residual stenosis.   1st Mrg- lesion is 80% stenosed distali in stent restenosis in distal stent.  Scoring balloon angioplasty was performed using a BALLN SCOREFLEX 2.50X15, followed by PTCA with 2.75 mm [place] balloon.   Post intervention, there is a 10% residual stenosis.   Non-stenotic Mid LAD lesion was previously treated.   Non-stenotic Prox RCA lesion was previously treated.   Non-stenotic Mid RCA to Dist RCA lesion was  previously treated.   Non-stenotic 1st Mrg-1 lesion was previously treated.   The left ventricular systolic function is normal.   LV end diastolic pressure is normal.   The left ventricular ejection fraction is 55-65% by visual estimate.   There is no aortic valve stenosis. Continue aggressive secondary prevention.   Plan for discharge in AM.  BP borderline during cath.  Decreasing dosages of BP meds for now.  Likely can return to baseline doses at discharge. If she has restenosis again in the same area proximally, would consider dilatation with a shockwave balloon given 180 degree arc of calcium behind stented segment.   Result Date: 07/30/2021   2nd RPL lesion is 80% stenosed. There is TIMI 3 flow in this small vessel. It is unchanged from prior.   RPDA lesion is 50% stenosed.   1st Mrg-proximal lesion is 95% stenosed- instent restenosis.  Scoring balloon angioplasty was performed using a BALLN SCOREFLEX 2.50X15,followed by PTCA with 3.0 mm [place] balloon.   Post intervention, there is a 0% residual stenosis.   1st Mrg- lesion is 80% stenosed distali in stent restenosis in distal stent.  Scoring balloon angioplasty was performed using a BALLN SCOREFLEX 2.50X15, followed by PTCA with 2.75 mm [place] balloon.   Post intervention, there is a 10% residual stenosis.   Non-stenotic Mid LAD lesion was previously treated.   Non-stenotic Prox RCA lesion was previously treated.   Non-stenotic Mid RCA to Dist RCA lesion was previously treated.   Non-stenotic 1st Mrg-1 lesion was previously treated.   The left ventricular systolic function is normal.   LV end diastolic pressure is normal.   The left ventricular ejection fraction is 55-65% by visual estimate.   There is no aortic valve stenosis. Continue aggressive secondary prevention.   Plan for discharge in AM.  BP borderline during cath.  Decreasing dosages of BP meds for now.  Likely can return to baseline doses at discharge. If she has restenosis again in the same area  proximally, would consider dilatation with a shockwave balloon given 180 degree arc of calcium behind stented segment.   Disposition   Pt is being discharged home today in good condition.  Follow-up Plans & Appointments     Follow-up  Information     Jettie Booze, MD Follow up.   Specialties: Cardiology, Radiology, Interventional Cardiology Why: cardiology scheduler will contact you to arrange follow up with Dr. Irish Lack or his APP. Contact information: [address] [place] [address] [phone]                Discharge Instructions     AMB Referral to Cardiac Rehabilitation - Phase II   Complete by: As directed    Diagnosis:  PTCA Stable Angina     After initial evaluation and assessments completed: Virtual Based Care may be provided alone or in conjunction with Phase 2 Cardiac Rehab based on patient barriers.: Yes   Amb Referral to Cardiac Rehabilitation   Complete by: As directed    Diagnosis: PTCA   After initial evaluation and assessments completed: Virtual Based Care may be provided alone or in conjunction with Phase 2 Cardiac Rehab based on patient barriers.: Yes       Discharge Medications   Allergies as of 07/31/2021       Reactions   Plavix [clopidogrel Bisulfate] Other (See Comments)   Stomach upset   Adhesive [tape] Rash   Site latex allergy        Medication List     STOP taking these medications    ibuprofen 200 MG tablet Commonly known as: ADVIL       TAKE these medications    Accu-Chek Guide test strip Generic drug: glucose blood Use as instructed   amLODipine 5 MG tablet Commonly known as: NORVASC TAKE 1 TABLET(5 MG) BY MOUTH IN THE MORNING AND AT BEDTIME   aspirin 81 MG chewable tablet Chew 1 tablet (81 mg total) by mouth daily.   atorvastatin 80 MG tablet Commonly known as: LIPITOR TAKE 1 TABLET(80 MG) BY MOUTH DAILY AT 6 PM   diphenhydrAMINE 50 MG capsule Commonly known as:  BENADRYL Take 50 mg by mouth at bedtime.   ezetimibe 10 MG tablet Commonly known as: ZETIA TAKE 1 TABLET(10 MG) BY MOUTH DAILY What changed: See the new instructions.   isosorbide mononitrate 60 MG 24 hr tablet Commonly known as: IMDUR TAKE 1 TABLET(60 MG) BY MOUTH DAILY What changed:  See the new instructions. Another medication with the same name was removed. Continue taking this medication, and follow the directions you see here.   Janumet XR 50-1000 MG Tb24 Generic drug: SitaGLIPtin-MetFORMIN HCl TAKE 2 TABLETS BY MOUTH EVERY MORNING   metoprolol succinate 25 MG 24 hr tablet Commonly known as: TOPROL-XL TAKE 1 TABLET(25 MG) BY MOUTH DAILY What changed: See the new instructions.   multivitamin with minerals Tabs tablet Take 1 tablet by mouth daily. Centrum Silver for Women   nitroGLYCERIN 0.4 MG SL tablet Commonly known as: Nitrostat Place 1 tablet (0.4 mg total) under the tongue every 5 (five) minutes as needed for chest pain.   omeprazole 20 MG capsule Commonly known as: PRILOSEC Take 20 mg by mouth daily before breakfast.   ticagrelor 90 MG Tabs tablet Commonly known as: Brilinta Take 1 tablet (90 mg total) by mouth 2 (two) times daily.   valsartan 320 MG tablet Commonly known as: DIOVAN TAKE 1 TABLET(320 MG) BY MOUTH DAILY What changed: See the new instructions.           Outstanding Labs/Studies   N/A  Duration of Discharge Encounter   Greater than 30 minutes including physician time.  Hilbert Corrigan, PA 07/31/2021, 10:23 AM

## 2021-07-31 NOTE — Progress Notes (Signed)
CARDIAC REHAB PHASE I   PRE:  Rate/Rhythm: 80 NSR    BP: sitting 109/70    SaO2: 99 RA  MODE:  Ambulation: 450 ft   POST:  Rate/Rhythm: 97 NSR    BP: sitting 122/93     SaO2: 100 RA  [id number] Patient ambulated in hallway independently with CR RN by side. Steady gait. No complaints. Patient to be D/C today. Education completed including diet, activity progression, and post PTCA cutting balloon after care, NTG, endpoints to exercise. Patient currently walking daily with husband and refused CR outpatient program. Referral placed per protocol and patient aware.    Noe Pittsley Minus Breeding RN, BSN

## 2021-08-02 ENCOUNTER — Telehealth (HOSPITAL_COMMUNITY): Payer: Self-pay

## 2021-08-02 ENCOUNTER — Telehealth: Payer: Self-pay | Admitting: Interventional Cardiology

## 2021-08-02 MED FILL — Nitroglycerin IV Soln 100 MCG/ML in D5W: INTRA_ARTERIAL | Qty: 10 | Status: AC

## 2021-08-02 NOTE — Telephone Encounter (Signed)
Patient contacted regarding discharge from [facility] on 07/31/21.  Patient understands to follow up 08/09/21 at 2:00pm with Laurann Montana, NP at the [facility]. Patient understands discharge instructions? yes Patient understands medications and regiment? yes Patient understands to bring all medications to this visit? yes  Ask patient:  Are you enrolled in My Chart (yes or no)  If no ask patient if they would like to enroll.   Pt also says she started her Janumet today per Almyra Deforest PA.

## 2021-08-02 NOTE — Telephone Encounter (Signed)
TOC per Almyra Deforest scheduled for 08/09/21 at 2:00pm with Laurann Montana, NP.

## 2021-08-02 NOTE — Telephone Encounter (Signed)
Per Ph. I, "Patient currently walking daily with husband and refused CR outpatient program."  Closed referral

## 2021-08-09 ENCOUNTER — Other Ambulatory Visit: Payer: Self-pay

## 2021-08-09 ENCOUNTER — Encounter (HOSPITAL_BASED_OUTPATIENT_CLINIC_OR_DEPARTMENT_OTHER): Payer: Self-pay | Admitting: Family

## 2021-08-09 ENCOUNTER — Ambulatory Visit (HOSPITAL_BASED_OUTPATIENT_CLINIC_OR_DEPARTMENT_OTHER): Payer: Medicare PPO | Admitting: Family

## 2021-08-09 VITALS — BP 100/50 | HR 78 | Ht 62.5 in | Wt 172.0 lb

## 2021-08-09 DIAGNOSIS — I25118 Atherosclerotic heart disease of native coronary artery with other forms of angina pectoris: Secondary | ICD-10-CM | POA: Diagnosis not present

## 2021-08-09 DIAGNOSIS — E785 Hyperlipidemia, unspecified: Secondary | ICD-10-CM

## 2021-08-09 DIAGNOSIS — I10 Essential (primary) hypertension: Secondary | ICD-10-CM

## 2021-08-09 DIAGNOSIS — E1159 Type 2 diabetes mellitus with other circulatory complications: Secondary | ICD-10-CM | POA: Diagnosis not present

## 2021-08-09 MED ORDER — NITROGLYCERIN 0.4 MG SL SUBL: 0.4000 mg | SUBLINGUAL_TABLET | SUBLINGUAL | 3 refills | Status: DC | PRN

## 2021-08-09 NOTE — Patient Instructions (Signed)
Medication Instructions:  Continue your current medications.   If your lightheadedness or dizziness becomes more consistent then we can reduce your Valsartan to a half tablet.   *If you need a refill on your cardiac medications before your next appointment, please call your pharmacy*  Lab Work: None ordered today.   Testing/Procedures: Your EKG today was stable compared to previous.  Follow-Up: At [facility], you and your health needs are our priority.  As part of our continuing mission to provide you with exceptional heart care, we have created designated Provider Care Teams.  These Care Teams include your primary Cardiologist (physician) and Advanced Practice Providers (APPs -  Physician Assistants and Nurse Practitioners) who all work together to provide you with the care you need, when you need it.  We recommend signing up for the patient portal called "MyChart".  Sign up information is provided on this After Visit Summary.  MyChart is used to connect with patients for Virtual Visits (Telemedicine).  Patients are able to view lab/test results, encounter notes, upcoming appointments, etc.  Non-urgent messages can be sent to your provider as well.   To learn more about what you can do with MyChart, go to [email or web address].    Your next appointment:   3-4 month(s)  The format for your next appointment:   In Person  Provider:   You may see Larae Grooms, MD or one of the following Advanced Practice Providers on your designated Care Team:   Melina Copa, PA-C Ermalinda Barrios, PA-C Loel Dubonnet, NP    Other Instructions  Recommend discussing with Maximiano Coss, NP if he thinks Vania Rea would be a good choice for your diabetes.  It offers a cardioprotective benefit.   Heart Healthy Diet Recommendations: A low-salt diet is recommended. Meats should be grilled, baked, or boiled. Avoid fried foods. Focus on lean protein sources like fish or chicken with vegetables and  fruits. The American Heart Association is a Microbiologist!    Exercise recommendations: The American Heart Association recommends 150 minutes of moderate intensity exercise weekly. Try 30 minutes of moderate intensity exercise 4-5 times per week. This could include walking, jogging, or swimming.

## 2021-08-09 NOTE — Progress Notes (Signed)
Office Visit    Patient Name: Suzanne Watkins Date of Encounter: 08/09/2021  PCP:  Maximiano Coss, NP   [place]  Cardiologist:  Larae Grooms, MD  Advanced Practice Provider:  No care team member to display Electrophysiologist:  None      Chief Complaint    Suzanne Watkins is a 70 y.o. female with a hx of CAD, HLD, DM2, HTN presents today for transitions of care after hospitalization   Past Medical History    Past Medical History:  Diagnosis Date   Anginal pain ([place])    Arthritis    "probably in my hands" (09/04/2013)   Basal cell carcinoma of nostril 05/2008   Cancer ([place])    Phreesia 05/17/2020   Cervical cancer ([place]) 1986   Coronary atherosclerosis of native coronary artery 09/04/2013   RCA and obtuse marginal stent/DES-2010   Diabetes mellitus without complication ([place])    Phreesia 05/17/2020   Fibromyalgia    "dx'd in 1994"   Full dentures    GERD (gastroesophageal reflux disease)    Hyperlipidemia    Hypertension    Myocardial infarction ([place])    Phreesia 05/17/2020   Obesity    Shortness of breath    "just related to angina I was having recently" (09/04/2013)   Type II diabetes mellitus ([place])    followed by pcp   Wears glasses    Past Surgical History:  Procedure Laterality Date   CARPAL TUNNEL RELEASE Right 07/1992   CERVICAL CONE BIOPSY  03/1985   CORONARY ANGIOPLASTY WITH STENT PLACEMENT  07/2009, 08/2009; 09/04/2013   "1+1+2; total of 4" (09/04/2013)   CORONARY BALLOON ANGIOPLASTY N/A 10/06/2020   Procedure: CORONARY BALLOON ANGIOPLASTY;  Surgeon: Jettie Booze, MD;  Location: [facility];  Service: Cardiovascular;  Laterality: N/A;   CORONARY BALLOON ANGIOPLASTY N/A 07/30/2021   Procedure: CORONARY BALLOON ANGIOPLASTY;  Surgeon: Jettie Booze, MD;  Location: [facility];  Service: Cardiovascular;  Laterality: N/A;   CORONARY STENT INTERVENTION N/A 01/15/2021   Procedure: CORONARY STENT  INTERVENTION;  Surgeon: Jettie Booze, MD;  Location: [facility];  Service: Cardiovascular;  Laterality: N/A;   FRACTURE SURGERY N/A    Phreesia 05/17/2020   LEFT HEART CATH AND CORONARY ANGIOGRAPHY N/A 10/06/2020   Procedure: LEFT HEART CATH AND CORONARY ANGIOGRAPHY;  Surgeon: Jettie Booze, MD;  Location: [facility];  Service: Cardiovascular;  Laterality: N/A;   LEFT HEART CATH AND CORONARY ANGIOGRAPHY N/A 01/15/2021   Procedure: LEFT HEART CATH AND CORONARY ANGIOGRAPHY;  Surgeon: Jettie Booze, MD;  Location: [facility];  Service: Cardiovascular;  Laterality: N/A;   LEFT HEART CATH AND CORONARY ANGIOGRAPHY N/A 07/30/2021   Procedure: LEFT HEART CATH AND CORONARY ANGIOGRAPHY;  Surgeon: Jettie Booze, MD;  Location: [facility];  Service: Cardiovascular;  Laterality: N/A;   LEFT HEART CATHETERIZATION WITH CORONARY ANGIOGRAM N/A 09/04/2013   Procedure: LEFT HEART CATHETERIZATION WITH CORONARY ANGIOGRAM;  Surgeon: Candee Furbish, MD;  Location: [facility];  Service: Cardiovascular;  Laterality: N/A;   MOHS SURGERY Left 05/2008   "nostril"   ORIF ANKLE FRACTURE Right 10/10/2018   Procedure: OPEN REDUCTION INTERNAL FIXATION (ORIF) ANKLE FRACTURE;  Surgeon: Rod Can, MD;  Location: [facility];  Service: Orthopedics;  Laterality: Right;   SHOULDER OPEN ROTATOR CUFF REPAIR Right 10/2004   "got 5 pins in" (09/04/2013)   TUBAL LIGATION Bilateral 1979    Allergies  Allergies  Allergen Reactions   Plavix [Clopidogrel Bisulfate] Other (See Comments)    Stomach upset   Adhesive [Tape] Rash    Site latex allergy    History of Present Illness    Suzanne Watkins is a 70 y.o. female with a hx of CAD, HLD, DM2, HTN  last seen while hospitalized.  Cardiac catheterization 09/2020 showing previous stents to mid LAD, proximal RCA, mid to distal RCA were patent.  Second RPL lesion with 100% stenosed and balloon atrial plasty performed -80% residual  stenosis.  First marginal-2 lesion 90% stenosed and balloon angioplasty performed -10% residual stenosis..  Stent unable to be delivered due to tortuosity in vessel.  Due to recurrent angina cardiac catheterization performed.  LHC 01/15/2021 with mid OM1 lesion 90% stenosed with tortuous vessel and superior disease.  DES successfully placed.  She was admitted 07/30/2021 due to more exertional angina.  Imdur was changed to 60 mg twice daily that she did not tolerate.  She was tolerating 90 mg in the a.m. and 30 mg in the p.m.  Cardiac catheterization revealed 80% second RPL lesion unchanged from previous cath, 95% proximal OM1 in-stent restenosis treated with scoring balloon angioplasty, 80% distal OM1 in-stent restenosis also treated with scoring balloon angioplasty, LVEF 55 to 65%.  She was discharged on aspirin, Brilinta, beta-blocker, calcium channel blocker, Imdur along with statin.  She presents today for follow-up.  Reports no anginal symptoms since hospital discharge.  Reports occasional lightheadedness with quick position changes but overall not bothersome.  She has been back to walking 20 minutes/day without exertional chest pain or dyspnea.  No near-syncope nor syncope.  No edema, orthopnea, PND.  Reports mild bruising to right groin site but overall healing well.  EKGs/Labs/Other Studies Reviewed:   The following studies were reviewed today:  Cath 07/30/2021   2nd RPL lesion is 80% stenosed. There is TIMI 3 flow in this small vessel. It is unchanged from prior.   RPDA lesion is 50% stenosed.   1st Mrg-proximal lesion is 95% stenosed- instent restenosis.  Scoring balloon angioplasty was performed using a BALLN SCOREFLEX 2.50X15,followed by PTCA with 3.0 mm [place] balloon.   Post intervention, there is a 0% residual stenosis.   1st Mrg- lesion is 80% stenosed distali in stent restenosis in distal stent.  Scoring balloon angioplasty was performed using a BALLN SCOREFLEX 2.50X15, followed by PTCA  with 2.75 mm [place] balloon.   Post intervention, there is a 10% residual stenosis.   Non-stenotic Mid LAD lesion was previously treated.   Non-stenotic Prox RCA lesion was previously treated.   Non-stenotic Mid RCA to Dist RCA lesion was previously treated.   Non-stenotic 1st Mrg-1 lesion was previously treated.   The left ventricular systolic function is normal.   LV end diastolic pressure is normal.   The left ventricular ejection fraction is 55-65% by visual estimate.   There is no aortic valve stenosis.   Continue aggressive secondary prevention.   Plan for discharge in AM.  BP borderline during cath.  Decreasing dosages of BP meds for now.  Likely can return to baseline doses at discharge.    If she has restenosis again in the same area proximally, would consider dilatation with a shockwave balloon given 180 degree arc of calcium behind stented segment.   EKG: EKG is ordered today.  The EKG performed today demonstrates NSR 78 bpm with stable RBBB and LAFB.  Recent Labs: 07/26/2021: ALT 27 07/31/2021: BUN 13; Creatinine, Ser 0.82; Hemoglobin 11.6;  Platelets 184; Potassium 4.3; Sodium 136  Recent Lipid Panel    Component Value Date/Time   CHOL 121 07/26/2021 1017   CHOL 127 10/28/2014 0801   TRIG 145 07/26/2021 1017   TRIG 192 (H) 10/28/2014 0801   HDL 37 (L) 07/26/2021 1017   HDL 30 (L) 10/28/2014 0801   CHOLHDL 3.3 07/26/2021 1017   CHOLHDL 3.8 08/08/2016 0731   VLDL 22 08/08/2016 0731   LDLCALC 59 07/26/2021 1017   LDLCALC 59 10/28/2014 0801     Home Medications   Current Meds  Medication Sig   amLODipine (NORVASC) 5 MG tablet TAKE 1 TABLET(5 MG) BY MOUTH IN THE MORNING AND AT BEDTIME   aspirin 81 MG chewable tablet Chew 1 tablet (81 mg total) by mouth daily.   atorvastatin (LIPITOR) 80 MG tablet TAKE 1 TABLET(80 MG) BY MOUTH DAILY AT 6 PM   diphenhydrAMINE (BENADRYL) 50 MG capsule Take 50 mg by mouth at bedtime.   ezetimibe (ZETIA) 10 MG tablet TAKE 1 TABLET(10 MG) BY  MOUTH DAILY   glucose blood (ACCU-CHEK GUIDE) test strip Use as instructed   isosorbide mononitrate (IMDUR) 60 MG 24 hr tablet TAKE 1 TABLET(60 MG) BY MOUTH DAILY   JANUMET XR 50-1000 MG TB24 TAKE 2 TABLETS BY MOUTH EVERY MORNING   metoprolol succinate (TOPROL-XL) 25 MG 24 hr tablet TAKE 1 TABLET(25 MG) BY MOUTH DAILY   Multiple Vitamin (MULTIVITAMIN WITH MINERALS) TABS tablet Take 1 tablet by mouth daily. Centrum Silver for Women   nitroGLYCERIN (NITROSTAT) 0.4 MG SL tablet Place 1 tablet (0.4 mg total) under the tongue every 5 (five) minutes as needed for chest pain.   omeprazole (PRILOSEC) 20 MG capsule Take 20 mg by mouth daily before breakfast.    ticagrelor (BRILINTA) 90 MG TABS tablet Take 1 tablet (90 mg total) by mouth 2 (two) times daily.   valsartan (DIOVAN) 320 MG tablet TAKE 1 TABLET(320 MG) BY MOUTH DAILY     Review of Systems      All other systems reviewed and are otherwise negative except as noted above.  Physical Exam    VS:  BP (!) 100/50   Pulse 78   Ht 5' 2.5" (1.588 m)   Wt 172 lb (78 kg)   SpO2 97%   BMI 30.96 kg/m  , BMI Body mass index is 30.96 kg/m.  Wt Readings from Last 3 Encounters:  08/09/21 172 lb (78 kg)  07/30/21 173 lb 6.4 oz (78.7 kg)  07/26/21 173 lb 3.2 oz (78.6 kg)     GEN: Well nourished, well developed, in no acute distress. HEENT: normal. Neck: Supple, no JVD, carotid bruits, or masses. Cardiac: RRR, no murmurs, rubs, or gallops. No clubbing, cyanosis, edema.  Radials/PT 2+ and equal bilaterally.  Respiratory:  Respirations regular and unlabored, clear to auscultation bilaterally. GI: Soft, nontender, nondistended. MS: No deformity or atrophy. Skin: Warm and dry, no rash.  Scattered ecchymosis to bilateral arms from IV sites.  Right groin with 1 inch x 2 inch purple area of ecchymosis but no hematoma. Neuro:  Strength and sensation are intact. Psych: Normal affect.  Assessment & Plan    CAD s/p multiple intervention -07/30/21 s/p  balloon angioplasty to 1st marginal. Right groin catheterization site with mild ecchymosis but healing well with no hematoma nor signs of infection.  No recurrent angina since angioplasty.  GDMT includes aspirin, amlodipine, Zetia, Lipitor, Imdur, Brilinta, PRN nitroglycerin.  Refill of nitroglycerin provided though thankfully she has not had to use.  Given recurrent angioplasty anticipate long term use of Brilitna but could consider reduced dose 1 year from recent intervention. Heart healthy diet and regular cardiovascular exercise encouraged.    HTN - BP well controlled. She is hypotensive today but asymptomatic. Reports only rare episodes of lightheadedness with quick position changes.  She prefers to continue her current antihypertensive regimen.  We did discuss that if she has worsening orthostatic symptoms then we would plan to reduce her valsartan dose by half.  HLD, LDL goal less than 70 -lipid panel 07/2021 total cholesterol 131, HDL 37, triglycerides 145, LDL 59.  Continue atorvastatin 80 mg daily and Zetia 10 mg daily.  DM2 - 07/26/21 A1c 6.2. Continue to follow with PCP.  If additional agent needed consider SGLT2 for cardioprotective benefit.  Disposition: Follow up in 3-4 month(s) with Dr. Irish Lack or APP.  Signed, Loel Dubonnet, NP 08/09/2021, 2:15 PM [facility]

## 2021-09-13 DIAGNOSIS — R42 Dizziness and giddiness: Secondary | ICD-10-CM | POA: Insufficient documentation

## 2021-09-13 DIAGNOSIS — I10 Essential (primary) hypertension: Secondary | ICD-10-CM

## 2021-09-13 DIAGNOSIS — Z5321 Procedure and treatment not carried out due to patient leaving prior to being seen by health care provider: Secondary | ICD-10-CM | POA: Insufficient documentation

## 2021-09-13 DIAGNOSIS — R0789 Other chest pain: Secondary | ICD-10-CM | POA: Diagnosis not present

## 2021-09-13 DIAGNOSIS — R0602 Shortness of breath: Secondary | ICD-10-CM | POA: Insufficient documentation

## 2021-09-13 DIAGNOSIS — R7989 Other specified abnormal findings of blood chemistry: Secondary | ICD-10-CM

## 2021-09-14 ENCOUNTER — Encounter (HOSPITAL_COMMUNITY): Payer: Self-pay | Admitting: Emergency Medicine

## 2021-09-14 ENCOUNTER — Other Ambulatory Visit: Payer: Self-pay

## 2021-09-14 ENCOUNTER — Emergency Department (HOSPITAL_COMMUNITY)
Admission: EM | Admit: 2021-09-14 | Discharge: 2021-09-14 | Disposition: A | Discharge status: Home / Self Care | Payer: Medicare PPO | Attending: Student | Admitting: Student

## 2021-09-14 ENCOUNTER — Emergency Department (HOSPITAL_COMMUNITY): Payer: Medicare PPO

## 2021-09-14 LAB — CBC WITH DIFFERENTIAL/PLATELET
Abs Immature Granulocytes: 0.03 10*3/uL (ref 0.00–0.07)
Basophils Absolute: 0.1 10*3/uL (ref 0.0–0.1)
Basophils Relative: 1 %
Eosinophils Absolute: 0.1 10*3/uL (ref 0.0–0.5)
Eosinophils Relative: 1 %
HCT: 37.8 % (ref 36.0–46.0)
Hemoglobin: 12.5 g/dL (ref 12.0–15.0)
Immature Granulocytes: 0 %
Lymphocytes Relative: 13 %
Lymphs Abs: 1.4 10*3/uL (ref 0.7–4.0)
MCH: 31.2 pg (ref 26.0–34.0)
MCHC: 33.1 g/dL (ref 30.0–36.0)
MCV: 94.3 fL (ref 80.0–100.0)
Monocytes Absolute: 0.8 10*3/uL (ref 0.1–1.0)
Monocytes Relative: 8 %
Neutro Abs: 8.3 10*3/uL — ABNORMAL HIGH (ref 1.7–7.7)
Neutrophils Relative %: 77 %
Platelets: 250 10*3/uL (ref 150–400)
RBC: 4.01 MIL/uL (ref 3.87–5.11)
RDW: 13.2 % (ref 11.5–15.5)
WBC: 10.8 10*3/uL — ABNORMAL HIGH (ref 4.0–10.5)
nRBC: 0 % (ref 0.0–0.2)

## 2021-09-14 LAB — COMPREHENSIVE METABOLIC PANEL
ALT: 46 U/L — ABNORMAL HIGH (ref 0–44)
AST: 42 U/L — ABNORMAL HIGH (ref 15–41)
Albumin: 3.9 g/dL (ref 3.5–5.0)
Alkaline Phosphatase: 78 U/L (ref 38–126)
Anion gap: 11 (ref 5–15)
BUN: 33 mg/dL — ABNORMAL HIGH (ref 8–23)
CO2: 22 mmol/L (ref 22–32)
Calcium: 10.3 mg/dL (ref 8.9–10.3)
Chloride: 103 mmol/L (ref 98–111)
Creatinine, Ser: 1.48 mg/dL — ABNORMAL HIGH (ref 0.44–1.00)
GFR, Estimated: 38 mL/min — ABNORMAL LOW (ref >60)
Glucose, Bld: 158 mg/dL — ABNORMAL HIGH (ref 70–99)
Potassium: 5.1 mmol/L (ref 3.5–5.1)
Sodium: 136 mmol/L (ref 135–145)
Total Bilirubin: 0.7 mg/dL (ref 0.3–1.2)
Total Protein: 6.5 g/dL (ref 6.5–8.1)

## 2021-09-14 LAB — TROPONIN I (HIGH SENSITIVITY)
Troponin I (High Sensitivity): 5 ng/L (ref <18)
Troponin I (High Sensitivity): 8 ng/L (ref <18)

## 2021-09-14 MED ORDER — ASPIRIN 81 MG PO CHEW: 324.0000 mg | CHEWABLE_TABLET | Freq: Once | ORAL | Status: DC

## 2021-09-14 MED ORDER — ISOSORBIDE MONONITRATE ER 60 MG PO TB24: 90.0000 mg | ORAL_TABLET | Freq: Every day | ORAL | 2 refills | Status: DC

## 2021-09-14 NOTE — Telephone Encounter (Signed)
ECG without changes, Troponin negative, so no heart damage.   Cr increased.  Stay well hydrated.  We will need to recheck the Cr.   It is possible that the scar tissue has come back to the vessel we worked on in September.  I will review the films with colleagues to see if there are any other ideas how to treat this area.  If it was a large, main vessel to the heart, we could consider a bypass operation since the stent continues to re-narrow.  Since it is a small, branch vessel, I would not consider CABG.   Can increase Imdur to 90 mg.  If that does not work, Could try another medicine like Ranexa.  Continue to monitor BP.  If BP stays elevated, would increase amlodipine to 10 mg daily. Need to recheck [facility] next week.     Let us know how BP and symptoms are doing.   JV

## 2021-09-14 NOTE — ED Provider Notes (Signed)
Emergency Medicine Provider Triage Evaluation Note  Suzanne Watkins , a 70 y.o. female  was evaluated in triage.  Pt complains of chest pain, intermittent x 4 days, more prominent today. Feels like a tightness with associated dyspnea and lightheadedness, triggered by exertion, improved when she sits down. Feels like when she has needed stent placement in the past. Sees Dr. Irish Lack for cardiology. No chest pain at present.   Review of Systems  Positive: Chest pain, dyspnea Negative: N/V,   Physical Exam  BP (!) 143/69 (BP Location: Left Arm)   Pulse 91   Temp 98.3 F (36.8 C) (Oral)   Resp 18   SpO2 100%  Gen:   Awake, no distress   Resp:  Normal effort  MSK:   Moves extremities without difficulty  Other:  Heart RRR.   Medical Decision Making  Medically screening exam initiated at 12:24 AM.  Appropriate orders placed.  Suzanne Watkins was informed that the remainder of the evaluation will be completed by another provider, this initial triage assessment does not replace that evaluation, and the importance of remaining in the ED until their evaluation is complete.  Chest pain.  Aspirin ordered. No current pain.    Leafy Kindle 09/14/21 0033    Palumbo, April, MD 09/14/21 6389

## 2021-09-14 NOTE — ED Triage Notes (Signed)
Patient with shortness of breath since Friday, she states that the chest pain started intermittently since Friday , but getting worse tonight.  She states that she has multiple stents. She states that she is having it with exertion.  Lightheadedness.  She had taken some nitro at home, which helped with the chest pain.

## 2021-09-14 NOTE — ED Notes (Signed)
Registration states this patient handed labels to them and stated they were leaving

## 2021-09-17 NOTE — Telephone Encounter (Signed)
   I spoke to the patient today.  Her symptoms have improved on the increased isosorbide.  If she has breakthrough angina on the isosorbide, would start Ranexa 500 mg p.o. twice daily.  I will also continue to see if there are any local hospitals with access to drug-eluting balloons.  This may be her best treatment for small vessel in-stent restenosis.  My contact at [facility] did not have access to drug-eluting balloons for coronaries.  She will keep [place] updated regarding her symptoms.  Jettie Booze, MD

## 2021-09-21 ENCOUNTER — Other Ambulatory Visit: Payer: Medicare PPO | Admitting: *Deleted

## 2021-09-21 ENCOUNTER — Other Ambulatory Visit: Payer: Self-pay

## 2021-09-21 DIAGNOSIS — R7989 Other specified abnormal findings of blood chemistry: Secondary | ICD-10-CM

## 2021-09-21 DIAGNOSIS — I10 Essential (primary) hypertension: Secondary | ICD-10-CM

## 2021-09-22 LAB — BASIC METABOLIC PANEL
BUN/Creatinine Ratio: 19 (ref 12–28)
BUN: 16 mg/dL (ref 8–27)
CO2: 23 mmol/L (ref 20–29)
Calcium: 9.9 mg/dL (ref 8.7–10.3)
Chloride: 101 mmol/L (ref 96–106)
Creatinine, Ser: 0.86 mg/dL (ref 0.57–1.00)
Glucose: 127 mg/dL — ABNORMAL HIGH (ref 70–99)
Potassium: 4.8 mmol/L (ref 3.5–5.2)
Sodium: 138 mmol/L (ref 134–144)
eGFR: 74 mL/min/{1.73_m2} (ref >59)

## 2021-09-28 ENCOUNTER — Telehealth: Payer: Self-pay | Admitting: Interventional Cardiology

## 2021-09-28 DIAGNOSIS — I25118 Atherosclerotic heart disease of native coronary artery with other forms of angina pectoris: Secondary | ICD-10-CM

## 2021-09-28 NOTE — Telephone Encounter (Signed)
  Are you calling in reference to your FMLA or disability form? no   What is your question in regards to FMLA or disability form? no   Do you need copies of your medical records? yes   Are you waiting on a nurse to call you back with results or are you wanting copies of your results? No   Drs office is needing the pt demographic and insurance info please advise   Fax# [phone]   Please route to Medical Records or your medical records site representative

## 2021-09-28 NOTE — Telephone Encounter (Signed)
    Spoke with Dr. Robley Fries in [place] at [facility].  He reviewed the cath films.  They have a trial of drug eluting balloons.  Dr. Robley Fries will contact the patient and coordinate.   Jettie Booze, MD

## 2021-09-28 NOTE — Telephone Encounter (Signed)
Will forward this message to our Medical Records Dept to further assist with requested demographics/records requested by [name]. Will cc Dr. Hassell Done covering RN in on this as well.

## 2021-09-29 NOTE — Telephone Encounter (Signed)
Mary from [facility] (Dr. Robley Fries) called stating she needs a referral, so she can call this patient and get her scheduled.

## 2021-09-29 NOTE — Telephone Encounter (Signed)
Referral placed to Dr. Robley Fries for pt to cardiac intervention/ Drug eluting balloon per Dr. Irish Lack.   [facility] [phone]... spoke with Kendrick Fries  Fax: [phone]  Records, referral, demographic sheet faxed.

## 2021-09-29 NOTE — Telephone Encounter (Signed)
Mary from [facility] (Dr. Robley Fries) called. She still has not gotten the demographics for this patient. Please fax to [phone]

## 2021-09-30 ENCOUNTER — Encounter: Payer: Self-pay | Admitting: Interventional Cardiology

## 2021-10-23 ENCOUNTER — Other Ambulatory Visit: Payer: Self-pay | Admitting: Interventional Cardiology

## 2021-10-29 ENCOUNTER — Encounter: Payer: Self-pay | Admitting: Interventional Cardiology

## 2021-12-04 ENCOUNTER — Other Ambulatory Visit: Payer: Self-pay | Admitting: Interventional Cardiology

## 2022-01-12 ENCOUNTER — Other Ambulatory Visit: Payer: Self-pay

## 2022-01-12 ENCOUNTER — Ambulatory Visit (HOSPITAL_BASED_OUTPATIENT_CLINIC_OR_DEPARTMENT_OTHER): Payer: Medicare PPO | Admitting: Nurse Practitioner

## 2022-01-12 ENCOUNTER — Encounter (HOSPITAL_BASED_OUTPATIENT_CLINIC_OR_DEPARTMENT_OTHER): Payer: Self-pay | Admitting: Nurse Practitioner

## 2022-01-12 VITALS — BP 117/72 | HR 72 | Ht 63.0 in | Wt 172.0 lb

## 2022-01-12 DIAGNOSIS — I10 Essential (primary) hypertension: Secondary | ICD-10-CM | POA: Diagnosis not present

## 2022-01-12 DIAGNOSIS — I2510 Atherosclerotic heart disease of native coronary artery without angina pectoris: Secondary | ICD-10-CM

## 2022-01-12 DIAGNOSIS — E1159 Type 2 diabetes mellitus with other circulatory complications: Secondary | ICD-10-CM | POA: Diagnosis not present

## 2022-01-12 DIAGNOSIS — I119 Hypertensive heart disease without heart failure: Secondary | ICD-10-CM

## 2022-01-12 DIAGNOSIS — E782 Mixed hyperlipidemia: Secondary | ICD-10-CM

## 2022-01-12 DIAGNOSIS — I2583 Coronary atherosclerosis due to lipid rich plaque: Secondary | ICD-10-CM

## 2022-01-12 DIAGNOSIS — I152 Hypertension secondary to endocrine disorders: Secondary | ICD-10-CM

## 2022-01-12 DIAGNOSIS — I25118 Atherosclerotic heart disease of native coronary artery with other forms of angina pectoris: Secondary | ICD-10-CM

## 2022-01-12 MED ORDER — EMPAGLIFLOZIN 10 MG PO TABS: 10.0000 mg | ORAL_TABLET | Freq: Every day | ORAL | 3 refills | Status: DC

## 2022-01-12 NOTE — Patient Instructions (Addendum)
Thank you for choosing [place] at [facility] for your Primary Care needs. I am excited for the opportunity to partner with you to meet your health care goals. It was a pleasure meeting you today! ? ?Recommendations from today's visit: ?We will plan on stopping the Janumet today. Hold onto the medication that you have at home in case we want to restart.  ?We will start the [place] today.  ?Monitor for any vaginal itching or pain with urination and let me know if this happens immediately. ?Let me know if you sugars are staying high or creeping up with the new medication ?Let's plan to follow-up in about 3 months unless you need to see me sooner just so we can recheck the A1c and make sure you are tolerating the medication well. Then we can go to further out monitoring.  ? ?Information on diet, exercise, and health maintenance recommendations are listed below. This is information to help you be sure you are on track for optimal health and monitoring.  ? ?Please look over this and let us know if you have any questions or if you have completed any of the health maintenance outside of [place] so that we can be sure your records are up to date.  ?___________________________________________________________ ?About Me: ?I am an Adult-Geriatric Nurse Practitioner with a background in caring for patients for more than 20 years with a strong intensive care background. I provide primary care and sports medicine services to patients age 21 and older within this office. My education had a strong focus on caring for the older adult population, which I am passionate about. I am also the director of the APP Fellowship with [facility].  ? ?My desire is to provide you with the best service through preventive medicine and supportive care. I consider you a part of the medical team and value your input. I work diligently to ensure that you are heard and your needs are met in a safe and effective manner. I  want you to feel comfortable with me as your provider and want you to know that your health concerns are important to me. ? ?For your information, our office hours are: ?Monday, Tuesday, and Thursday 8:00 AM - 5:00 PM ?Wednesday and Friday 8:00 AM - 12:00 PM.  ? ?In my time away from the office I am teaching new APP's within the system and am unavailable, but my partner, Dr. Burnard Bunting is in the office for emergent needs.  ? ?If you have questions or concerns, please call our office at [phone] or send [place] a MyChart message and we will respond as quickly as possible.  ?____________________________________________________________ ?MyChart:  ?For all urgent or time sensitive needs we ask that you please call the office to avoid delays. Our number is [phone]. ?MyChart is not constantly monitored and due to the large volume of messages a day, replies may take up to 72 business hours. ? ?MyChart Policy: ?MyChart allows for you to see your visit notes, after visit summary, provider recommendations, lab and tests results, make an appointment, request refills, and contact your provider or the office for non-urgent questions or concerns. Providers are seeing patients during normal business hours and do not have built in time to review MyChart messages.  ?We ask that you allow a minimum of 3 business days for responses to [employer]. For this reason, please do not send urgent requests through [place]. Please call the office at [phone]. ?New and ongoing conditions may require  a visit. We have virtual and in person visit available for your convenience.  ?Complex MyChart concerns may require a visit. Your provider may request you schedule a virtual or in person visit to ensure we are providing the best care possible. ?MyChart messages sent after 11:00 AM on Friday will not be received by the provider until Monday morning.  ?  ?Lab and Test Results: ?You will receive your lab and test results on MyChart as soon  as they are completed and results have been sent by the lab or testing facility. Due to this service, you will receive your results BEFORE your provider.  ?I review lab and tests results each morning prior to seeing patients. Some results require collaboration with other providers to ensure you are receiving the most appropriate care. For this reason, we ask that you please allow a minimum of 3-5 business days from the time the ALL results have been received for your provider to receive and review lab and test results and contact you about these.  ?Most lab and test result comments from the provider will be sent through [place]. Your provider may recommend changes to the plan of care, follow-up visits, repeat testing, ask questions, or request an office visit to discuss these results. You may reply directly to this message or call the office at [phone] to provide information for the provider or set up an appointment. ?In some instances, you will be called with test results and recommendations. Please let us know if this is preferred and we will make note of this in your chart to provide this for you.    ?If you have not heard a response to your lab or test results in 5 business days from all results returning to [place], please call the office to let us know. We ask that you please avoid calling prior to this time unless there is an emergent concern. Due to high call volumes, this can delay the resulting process. ? ?After Hours: ?For all non-emergency after hours needs, please call the office at [phone] and select the option to reach the on-call provider service. On-call services are shared between multiple [place] offices and therefore it will not be possible to speak directly with your provider. On-call providers may provide medical advice and recommendations, but are unable to provide refills for maintenance medications.  ?For all emergency or urgent medical needs after normal business hours, we  recommend that you seek care at the closest Urgent Care or Emergency Department to ensure appropriate treatment in a timely manner.  ?[facility] [place] has a 24 hour emergency room located on the ground floor for your convenience.  ? ?Urgent Concerns During the Business Day ?Providers are seeing patients from 8AM to [place] with a busy schedule and are most often not able to respond to non-urgent calls until the end of the day or the next business day. ?If you should have URGENT concerns during the day, please call and speak to the nurse or schedule a same day appointment so that we can address your concern without delay.  ? ?Thank you, again, for choosing me as your health care partner. I appreciate your trust and look forward to learning more about you.  ? ?[name], DNP, AGNP-c ?___________________________________________________________ ? ?Health Maintenance Recommendations ?Screening Testing ?Mammogram ?Every 1 -2 years based on history and risk factors ?Starting at age 41 ?Pap Smear ?Ages 21-39 every 3 years ?Ages 50-65 every 5 years with HPV testing ?More frequent testing may  be required based on results and history ?Colon Cancer Screening ?Every 1-10 years based on test performed, risk factors, and history ?Starting at age 83 ?Bone Density Screening ?Every 2-10 years based on history ?Starting at age 44 for women ?Recommendations for men differ based on medication usage, history, and risk factors ?AAA Screening ?One time ultrasound ?Men 28-91 years old who have every smoked ?Lung Cancer Screening ?Low Dose Lung CT every 12 months ?Age 87-80 years with a 30 pack-year smoking history who still smoke or who have quit within the last 15 years ? ?Screening Labs ?Routine  Labs: Complete Blood Count (CBC), Complete Metabolic Panel (CMP), Cholesterol (Lipid Panel) ?Every 6-12 months based on history and medications ?May be recommended more frequently based on current conditions or previous  results ?Hemoglobin A1c Lab ?Every 3-12 months based on history and previous results ?Starting at age 43 or earlier with diagnosis of diabetes, high cholesterol, BMI >26, and/or risk factors ?Frequent monitoring f

## 2022-01-12 NOTE — Progress Notes (Signed)
Tollie Eth, DNP, AGNP-c Primary Care [facility] Sports Medicine [address]  [address] [place], [place] [address] [phone]  New patient visit   Patient: Suzanne Watkins   DOB: 13-Jul-1951   71 y.o. Female  MRN: [id number] Visit Date: 01/12/2022  Patient Care Team: Temisha Murley, Sung Amabile, NP as PCP - General (Nurse Practitioner) Corky Crafts, MD as PCP - Cardiology (Cardiology) Himmelrich, Loree Fee, RD (Inactive) as Dietitian Corky Crafts, MD as Consulting Physician (Cardiology) Olivia Mackie, MD as Consulting Physician (Obstetrics and Gynecology) Kathyrn Sheriff (Optometry)  Today's healthcare provider: Tollie Eth, NP   Chief Complaint  Patient presents with   New Patient (Initial Visit)    Patient present today to establish care. She would like full lab panel. Discuss A1C and medications   Subjective    Suzanne Watkins is a 71 y.o. female who presents today as a new patient to establish care.    Patient endorses the following concerns presently: Diabetes and medication concerns Elide endorses: -diagnosed with DM2 in 2008 - initially did not control BG well and in 2010 dx with CVD and had first stent placed - since that time has had to have 5 stents - in 2012 A1c 10% - decided at that time to make changes and went to nutritionist and worked on diet and weight loss to control BG - lost 50lbs - most recent A1c 6.0% - sometimes craves carbohydrates, but is able to control this - Taking Janumet - AM fasting BG 135-150 typically - was recommended by cardiology recently that she may want to consider switching to [facility] for kidney and CV protection.  - would like my advice on this.   History reviewed and reveals the following: Past Medical History:  Diagnosis Date   Anginal pain (HCC)    Arthritis    "probably in my hands" (09/04/2013)   Basal cell carcinoma of nostril 05/2008   Cancer (HCC)    Phreesia 05/17/2020   Cervical cancer  (HCC) 1986   Coronary atherosclerosis of native coronary artery 09/04/2013   RCA and obtuse marginal stent/DES-2010   Diabetes mellitus without complication (HCC)    Phreesia 05/17/2020   Fibromyalgia    "dx'd in 1994"   Full dentures    GERD (gastroesophageal reflux disease)    Hyperlipidemia    Hypertension    Myocardial infarction (HCC)    Phreesia 05/17/2020   Obesity    Shortness of breath    "just related to angina I was having recently" (09/04/2013)   Type II diabetes mellitus (HCC)    followed by pcp   Unstable angina (HCC) 09/04/2013   Wears glasses    Past Surgical History:  Procedure Laterality Date   CARPAL TUNNEL RELEASE Right 07/1992   CERVICAL CONE BIOPSY  03/1985   CORONARY ANGIOPLASTY WITH STENT PLACEMENT  07/2009, 08/2009; 09/04/2013   "1+1+2; total of 4" (09/04/2013)   CORONARY BALLOON ANGIOPLASTY N/A 10/06/2020   Procedure: CORONARY BALLOON ANGIOPLASTY;  Surgeon: Corky Crafts, MD;  Location: [facility] INVASIVE CV LAB;  Service: Cardiovascular;  Laterality: N/A;   CORONARY BALLOON ANGIOPLASTY N/A 07/30/2021   Procedure: CORONARY BALLOON ANGIOPLASTY;  Surgeon: Corky Crafts, MD;  Location: [facility] INVASIVE CV LAB;  Service: Cardiovascular;  Laterality: N/A;   CORONARY STENT INTERVENTION N/A 01/15/2021   Procedure: CORONARY STENT INTERVENTION;  Surgeon: Corky Crafts, MD;  Location: [facility] INVASIVE CV LAB;  Service: Cardiovascular;  Laterality: N/A;   FRACTURE SURGERY  N/A    Phreesia 05/17/2020   LEFT HEART CATH AND CORONARY ANGIOGRAPHY N/A 10/06/2020   Procedure: LEFT HEART CATH AND CORONARY ANGIOGRAPHY;  Surgeon: Corky Crafts, MD;  Location: [facility] INVASIVE CV LAB;  Service: Cardiovascular;  Laterality: N/A;   LEFT HEART CATH AND CORONARY ANGIOGRAPHY N/A 01/15/2021   Procedure: LEFT HEART CATH AND CORONARY ANGIOGRAPHY;  Surgeon: Corky Crafts, MD;  Location: [facility] INVASIVE CV LAB;  Service: Cardiovascular;  Laterality: N/A;   LEFT HEART CATH AND CORONARY  ANGIOGRAPHY N/A 07/30/2021   Procedure: LEFT HEART CATH AND CORONARY ANGIOGRAPHY;  Surgeon: Corky Crafts, MD;  Location: [facility] INVASIVE CV LAB;  Service: Cardiovascular;  Laterality: N/A;   LEFT HEART CATHETERIZATION WITH CORONARY ANGIOGRAM N/A 09/04/2013   Procedure: LEFT HEART CATHETERIZATION WITH CORONARY ANGIOGRAM;  Surgeon: Donato Schultz, MD;  Location: [facility] CATH LAB;  Service: Cardiovascular;  Laterality: N/A;   MOHS SURGERY Left 05/2008   "nostril"   ORIF ANKLE FRACTURE Right 10/10/2018   Procedure: OPEN REDUCTION INTERNAL FIXATION (ORIF) ANKLE FRACTURE;  Surgeon: Samson Frederic, MD;  Location: [facility] ORS;  Service: Orthopedics;  Laterality: Right;   SHOULDER OPEN ROTATOR CUFF REPAIR Right 10/2004   "got 5 pins in" (09/04/2013)   TUBAL LIGATION Bilateral 1979   Family Status  Relation Name Status   Mother  Deceased at age 64       AMI   Father  Deceased at age 55       brain aneurysm   Brother  Deceased   Daughter  Deceased   Daughter  Alive   PGM  Deceased   PGF  Deceased   MGM  Deceased   MGF  Deceased   Family History  Problem Relation Age of Onset   Heart disease Mother 21       AMI; cause of death   Hyperlipidemia Mother    Cancer Father        Throat   Hyperlipidemia Brother    Hypertension Brother    Drug abuse Brother    Diabetes Daughter    Drug abuse Daughter    Heart disease Daughter    Lupus Daughter    Heart disease Paternal Grandmother    Stroke Paternal Grandfather    Social History   Socioeconomic History   Marital status: Married    Spouse name: Not on file   Number of children: Not on file   Years of education: Not on file   Highest education level: Not on file  Occupational History   Not on file  Tobacco Use   Smoking status: Former    Packs/day: 1.00    Years: 29.00    Pack years: 29.00    Types: Cigarettes    Quit date: 06/14/2006    Years since quitting: 15.6   Smokeless tobacco: Never  Vaping Use   Vaping Use: Never used  Substance  and Sexual Activity   Alcohol use: No   Drug use: No   Sexual activity: Yes    Birth control/protection: None  Other Topics Concern   Not on file  Social History Narrative   Marital status: married      Children: grown children; 9 grandchildren; 1 gg      Lives: with husband, 2 birds/[employer] operator      Employment:  [employer] x 18 years; billing      Tobacco: quit smoking 05/2006      Alcohol:  None     Exercise: walking 30 minutes per  day   Social Determinants of Health   Financial Resource Strain: Not on file  Food Insecurity: Not on file  Transportation Needs: Not on file  Physical Activity: Not on file  Stress: Not on file  Social Connections: Not on file   Outpatient Medications Prior to Visit  Medication Sig   amLODipine (NORVASC) 5 MG tablet TAKE 1 TABLET(5 MG) BY MOUTH IN THE MORNING AND AT BEDTIME (Patient taking differently: Take 5 mg by mouth in the morning and at bedtime.)   aspirin 81 MG chewable tablet Chew 1 tablet (81 mg total) by mouth daily.   atorvastatin (LIPITOR) 80 MG tablet TAKE 1 TABLET(80 MG) BY MOUTH DAILY AT 6 PM (Patient taking differently: Take 80 mg by mouth every evening.)   BRILINTA 90 MG TABS tablet TAKE 1 TABLET(90 MG) BY MOUTH TWICE DAILY   diphenhydrAMINE (BENADRYL) 50 MG capsule Take 50 mg by mouth at bedtime.   ezetimibe (ZETIA) 10 MG tablet TAKE 1 TABLET(10 MG) BY MOUTH DAILY (Patient taking differently: Take 10 mg by mouth at bedtime.)   glucose blood (ACCU-CHEK GUIDE) test strip Use as instructed (Patient taking differently: Check blood sugar daily)   guaiFENesin (MUCINEX) 600 MG 12 hr tablet Take 600 mg by mouth daily as needed for cough or to loosen phlegm.   isosorbide mononitrate (IMDUR) 60 MG 24 hr tablet Take 1.5 tablets (90 mg total) by mouth daily.   metoprolol succinate (TOPROL-XL) 25 MG 24 hr tablet TAKE 1 TABLET(25 MG) BY MOUTH DAILY   Multiple Vitamin (MULTIVITAMIN WITH MINERALS) TABS tablet Take 1 tablet by mouth at  bedtime. Centrum Silver for Women   nitroGLYCERIN (NITROSTAT) 0.4 MG SL tablet Place 1 tablet (0.4 mg total) under the tongue every 5 (five) minutes as needed for chest pain.   omeprazole (PRILOSEC) 20 MG capsule Take 20 mg by mouth daily before breakfast.    valsartan (DIOVAN) 320 MG tablet TAKE 1 TABLET(320 MG) BY MOUTH DAILY (Patient taking differently: Take 320 mg by mouth at bedtime.)   [DISCONTINUED] JANUMET XR 50-1000 MG TB24 TAKE 2 TABLETS BY MOUTH EVERY MORNING   No facility-administered medications prior to visit.   Allergies  Allergen Reactions   Plavix [Clopidogrel Bisulfate] Other (See Comments)    Stomach upset   Adhesive [Tape] Rash    Site latex allergy   Immunization History  Administered Date(s) Administered   Influenza, High Dose Seasonal PF 06/24/2020   Influenza,inj,Quad PF,6+ Mos 08/20/2015   Influenza,inj,quad, With Preservative 06/27/2018   Influenza-Unspecified 07/24/2013, 08/14/2014, 06/27/2018, 06/30/2021   PFIZER(Purple Top)SARS-COV-2 Vaccination 11/22/2019, 12/13/2019   Pneumococcal Conjugate-13 09/20/2018   Pneumococcal Polysaccharide-23 08/20/2015   Zoster, Live 05/11/2014    Health Maintenance Due: Health Maintenance  Topic Date Due   TETANUS/TDAP  Never done   Zoster Vaccines- Shingrix (1 of 2) Never done   COLONOSCOPY (Pts 45-76yrs Insurance coverage will need to be confirmed)  04/05/2017   OPHTHALMOLOGY EXAM  05/29/2019   COVID-19 Vaccine (3 - Booster for Pfizer series) 02/07/2020   Pneumonia Vaccine 9+ Years old (3) 08/19/2020   MAMMOGRAM  12/27/2020   FOOT EXAM  07/03/2021   HEMOGLOBIN A1C  07/15/2022   INFLUENZA VACCINE  Completed   DEXA SCAN  Completed   Hepatitis C Screening  Completed   HPV VACCINES  Aged Out    Review of Systems All review of systems negative except what is listed in the HPI   Objective    Pulse 72   Ht 5\' 3"  (1.6  m)   Wt 172 lb (78 kg)   SpO2 98%   BMI 30.47 kg/m  Physical Exam Vitals and nursing  note reviewed.  Constitutional:      General: She is not in acute distress.    Appearance: Normal appearance.  Eyes:     Extraocular Movements: Extraocular movements intact.     Conjunctiva/sclera: Conjunctivae normal.     Pupils: Pupils are equal, round, and reactive to light.  Neck:     Vascular: No carotid bruit.  Cardiovascular:     Rate and Rhythm: Normal rate and regular rhythm.     Pulses: Normal pulses.     Heart sounds: Normal heart sounds. No murmur heard. Pulmonary:     Effort: Pulmonary effort is normal.     Breath sounds: Normal breath sounds. No wheezing.  Abdominal:     General: Bowel sounds are normal.     Palpations: Abdomen is soft.  Musculoskeletal:        General: Normal range of motion.     Cervical back: Normal range of motion.     Right lower leg: No edema.     Left lower leg: No edema.  Skin:    General: Skin is warm and dry.     Capillary Refill: Capillary refill takes less than 2 seconds.  Neurological:     General: No focal deficit present.     Mental Status: She is alert and oriented to person, place, and time.  Psychiatric:        Mood and Affect: Mood normal.        Behavior: Behavior normal.        Thought Content: Thought content normal.        Judgment: Judgment normal.    Results for orders placed or performed in visit on 01/12/22  CBC with Differential/Platelet  Result Value Ref Range   WBC 8.9 3.4 - 10.8 x10E3/uL   RBC 4.51 3.77 - 5.28 x10E6/uL   Hemoglobin 13.9 11.1 - 15.9 g/dL   Hematocrit 46.9 62.9 - 46.6 %   MCV 90 79 - 97 fL   MCH 30.8 26.6 - 33.0 pg   MCHC 34.2 31.5 - 35.7 g/dL   RDW 52.8 41.3 - 24.4 %   Platelets 246 150 - 450 x10E3/uL   Neutrophils 70 Not Estab. %   Lymphs 15 Not Estab. %   Monocytes 10 Not Estab. %   Eos 3 Not Estab. %   Basos 1 Not Estab. %   Neutrophils Absolute 6.3 1.4 - 7.0 x10E3/uL   Lymphocytes Absolute 1.4 0.7 - 3.1 x10E3/uL   Monocytes Absolute 0.9 0.1 - 0.9 x10E3/uL   EOS (ABSOLUTE) 0.2  0.0 - 0.4 x10E3/uL   Basophils Absolute 0.1 0.0 - 0.2 x10E3/uL   Immature Granulocytes 1 Not Estab. %   Immature Grans (Abs) 0.1 0.0 - 0.1 x10E3/uL  Comprehensive metabolic panel  Result Value Ref Range   Glucose 164 (H) 70 - 99 mg/dL   BUN 19 8 - 27 mg/dL   Creatinine, Ser 0.10 0.57 - 1.00 mg/dL   eGFR 66 >27 OZ/DGU/4.40   BUN/Creatinine Ratio 20 12 - 28   Sodium 137 134 - 144 mmol/L   Potassium 5.0 3.5 - 5.2 mmol/L   Chloride 100 96 - 106 mmol/L   CO2 21 20 - 29 mmol/L   Calcium 10.3 8.7 - 10.3 mg/dL   Total Protein 6.8 6.0 - 8.5 g/dL   Albumin 4.8 3.8 - 4.8 g/dL  Globulin, Total 2.0 1.5 - 4.5 g/dL   Albumin/Globulin Ratio 2.4 (H) 1.2 - 2.2   Bilirubin Total 0.4 0.0 - 1.2 mg/dL   Alkaline Phosphatase 91 44 - 121 IU/L   AST 45 (H) 0 - 40 IU/L   ALT 45 (H) 0 - 32 IU/L  Lipid panel  Result Value Ref Range   Cholesterol, Total 119 100 - 199 mg/dL   Triglycerides 409 (H) 0 - 149 mg/dL   HDL 37 (L) >81 mg/dL   VLDL Cholesterol Cal 26 5 - 40 mg/dL   LDL Chol Calc (NIH) 56 0 - 99 mg/dL   Chol/HDL Ratio 3.2 0.0 - 4.4 ratio  Hemoglobin A1c  Result Value Ref Range   Hgb A1c MFr Bld 6.1 (H) 4.8 - 5.6 %   Est. average glucose Bld gHb Est-mCnc 128 mg/dL  VITAMIN D 25 Hydroxy (Vit-D Deficiency, Fractures)  Result Value Ref Range   Vit D, 25-Hydroxy 45.4 30.0 - 100.0 ng/mL    Assessment & Plan      Problem List Items Addressed This Visit     Diabetes (HCC) - Primary    Chronic. Well controlled on Janumet. Discussion with patient about changing medication to Jardiance to help with improved CV protection in the setting of significant CV disease. I do think this would be beneficial to her overall health. Will plan to d/x Janumet today and start Jardiance. Will monitor A1c today and in 3 months to ensure that BG control remains stable. Will consider dose increase or low dose Janumet added back if BG control is not equivalent.        Relevant Medications   empagliflozin (JARDIANCE)  10 MG TABS tablet   Other Relevant Orders   CBC with Differential/Platelet (Completed)   Comprehensive metabolic panel (Completed)   Lipid panel (Completed)   Hemoglobin A1c (Completed)   VITAMIN D 25 Hydroxy (Vit-D Deficiency, Fractures) (Completed)   Hypertension    Chronic. Well controlled. Followed by cardiology.  Getting 4000-7000 steps a day and monitoring diet.  In setting of DM, HLD, and CVD risks for recurrent events are high. Recommend continued close monitoring and tight control to help best protect patient from future events.  Will continue to collaborate with cardiology.       Relevant Medications   empagliflozin (JARDIANCE) 10 MG TABS tablet   Other Relevant Orders   CBC with Differential/Platelet (Completed)   Comprehensive metabolic panel (Completed)   Lipid panel (Completed)   Hemoglobin A1c (Completed)   VITAMIN D 25 Hydroxy (Vit-D Deficiency, Fractures) (Completed)   Hyperlipidemia    Chronic. Well controlled. Managed by cardiology. Labs today.  Continue current diet and exercise plan and medication management for decreased CV risks.  Will continue to collaborate with cardiology for care.       Relevant Medications   empagliflozin (JARDIANCE) 10 MG TABS tablet   Other Relevant Orders   CBC with Differential/Platelet (Completed)   Comprehensive metabolic panel (Completed)   Lipid panel (Completed)   Hemoglobin A1c (Completed)   VITAMIN D 25 Hydroxy (Vit-D Deficiency, Fractures) (Completed)   Coronary atherosclerosis of native coronary artery    Chronic. Stent placement x5. Currently doing well and followed with cardiology.  Managing diet and getting exercise.  DM, HTN, HLD under control.  Recommend continued close monitoring and tight control to protect against future CV risks.  Will continue to collaborate care with cardiology team.       Relevant Medications   empagliflozin (JARDIANCE) 10 MG TABS  tablet   Other Relevant Orders   CBC with  Differential/Platelet (Completed)   Comprehensive metabolic panel (Completed)   Lipid panel (Completed)   Hemoglobin A1c (Completed)   VITAMIN D 25 Hydroxy (Vit-D Deficiency, Fractures) (Completed)   Hypertension associated with diabetes (HCC)    See HTN      Relevant Medications   empagliflozin (JARDIANCE) 10 MG TABS tablet   CAD (coronary artery disease)    See coronary atherosclerosis of native coronary artery      Relevant Medications   empagliflozin (JARDIANCE) 10 MG TABS tablet   Other Relevant Orders   CBC with Differential/Platelet (Completed)   Comprehensive metabolic panel (Completed)   Lipid panel (Completed)   Hemoglobin A1c (Completed)   VITAMIN D 25 Hydroxy (Vit-D Deficiency, Fractures) (Completed)     Return in about 3 months (around 04/14/2022) for DM new med recheck.       Georgie Eduardo, Sung Amabile, NP, DNP, AGNP-C Primary Care & Sports Medicine at [facility]

## 2022-01-13 LAB — CBC WITH DIFFERENTIAL/PLATELET
Basophils Absolute: 0.1 10*3/uL (ref 0.0–0.2)
Basos: 1 %
EOS (ABSOLUTE): 0.2 10*3/uL (ref 0.0–0.4)
Eos: 3 %
Hematocrit: 40.7 % (ref 34.0–46.6)
Hemoglobin: 13.9 g/dL (ref 11.1–15.9)
Immature Grans (Abs): 0.1 10*3/uL (ref 0.0–0.1)
Immature Granulocytes: 1 %
Lymphocytes Absolute: 1.4 10*3/uL (ref 0.7–3.1)
Lymphs: 15 %
MCH: 30.8 pg (ref 26.6–33.0)
MCHC: 34.2 g/dL (ref 31.5–35.7)
MCV: 90 fL (ref 79–97)
Monocytes Absolute: 0.9 10*3/uL (ref 0.1–0.9)
Monocytes: 10 %
Neutrophils Absolute: 6.3 10*3/uL (ref 1.4–7.0)
Neutrophils: 70 %
Platelets: 246 10*3/uL (ref 150–450)
RBC: 4.51 x10E6/uL (ref 3.77–5.28)
RDW: 12.9 % (ref 11.7–15.4)
WBC: 8.9 10*3/uL (ref 3.4–10.8)

## 2022-01-13 LAB — COMPREHENSIVE METABOLIC PANEL
ALT: 45 IU/L — ABNORMAL HIGH (ref 0–32)
AST: 45 IU/L — ABNORMAL HIGH (ref 0–40)
Albumin/Globulin Ratio: 2.4 — ABNORMAL HIGH (ref 1.2–2.2)
Albumin: 4.8 g/dL (ref 3.8–4.8)
Alkaline Phosphatase: 91 IU/L (ref 44–121)
BUN/Creatinine Ratio: 20 (ref 12–28)
BUN: 19 mg/dL (ref 8–27)
Bilirubin Total: 0.4 mg/dL (ref 0.0–1.2)
CO2: 21 mmol/L (ref 20–29)
Calcium: 10.3 mg/dL (ref 8.7–10.3)
Chloride: 100 mmol/L (ref 96–106)
Creatinine, Ser: 0.94 mg/dL (ref 0.57–1.00)
Globulin, Total: 2 g/dL (ref 1.5–4.5)
Glucose: 164 mg/dL — ABNORMAL HIGH (ref 70–99)
Potassium: 5 mmol/L (ref 3.5–5.2)
Sodium: 137 mmol/L (ref 134–144)
Total Protein: 6.8 g/dL (ref 6.0–8.5)
eGFR: 66 mL/min/{1.73_m2} (ref >59)

## 2022-01-13 LAB — LIPID PANEL
Chol/HDL Ratio: 3.2 ratio (ref 0.0–4.4)
Cholesterol, Total: 119 mg/dL (ref 100–199)
HDL: 37 mg/dL — ABNORMAL LOW (ref >39)
LDL Chol Calc (NIH): 56 mg/dL (ref 0–99)
Triglycerides: 151 mg/dL — ABNORMAL HIGH (ref 0–149)
VLDL Cholesterol Cal: 26 mg/dL (ref 5–40)

## 2022-01-13 LAB — VITAMIN D 25 HYDROXY (VIT D DEFICIENCY, FRACTURES): Vit D, 25-Hydroxy: 45.4 ng/mL (ref 30.0–100.0)

## 2022-01-13 LAB — HEMOGLOBIN A1C
Est. average glucose Bld gHb Est-mCnc: 128 mg/dL
Hgb A1c MFr Bld: 6.1 % — ABNORMAL HIGH (ref 4.8–5.6)

## 2022-01-19 ENCOUNTER — Encounter (HOSPITAL_BASED_OUTPATIENT_CLINIC_OR_DEPARTMENT_OTHER): Payer: Self-pay | Admitting: Nurse Practitioner

## 2022-01-20 ENCOUNTER — Encounter (HOSPITAL_BASED_OUTPATIENT_CLINIC_OR_DEPARTMENT_OTHER): Payer: Self-pay | Admitting: Nurse Practitioner

## 2022-01-20 NOTE — Assessment & Plan Note (Signed)
Chronic. Well controlled. Managed by cardiology. Labs today.  ?Continue current diet and exercise plan and medication management for decreased CV risks.  ?Will continue to collaborate with cardiology for care.  ?

## 2022-01-20 NOTE — Assessment & Plan Note (Signed)
Chronic. Well controlled on Janumet. Discussion with patient about changing medication to Jardiance to help with improved CV protection in the setting of significant CV disease. I do think this would be beneficial to her overall health. Will plan to d/x Janumet today and start Jardiance. Will monitor A1c today and in 3 months to ensure that BG control remains stable. Will consider dose increase or low dose Janumet added back if BG control is not equivalent.  ? ?

## 2022-01-20 NOTE — Assessment & Plan Note (Signed)
See "HTN"

## 2022-01-20 NOTE — Assessment & Plan Note (Signed)
See coronary atherosclerosis of native coronary artery ?

## 2022-01-20 NOTE — Assessment & Plan Note (Signed)
Chronic. Well controlled. Followed by cardiology.  ?Getting 4000-7000 steps a day and monitoring diet.  ?In setting of DM, HLD, and CVD risks for recurrent events are high. Recommend continued close monitoring and tight control to help best protect patient from future events.  ?Will continue to collaborate with cardiology.  ?

## 2022-01-20 NOTE — Assessment & Plan Note (Signed)
Chronic. Stent placement x5. Currently doing well and followed with cardiology.  ?Managing diet and getting exercise.  ?DM, HTN, HLD under control.  ?Recommend continued close monitoring and tight control to protect against future CV risks.  ?Will continue to collaborate care with cardiology team.  ?

## 2022-01-21 ENCOUNTER — Encounter: Payer: Self-pay | Admitting: Interventional Cardiology

## 2022-01-21 ENCOUNTER — Other Ambulatory Visit: Payer: Self-pay

## 2022-01-21 ENCOUNTER — Ambulatory Visit: Payer: Medicare PPO | Admitting: Interventional Cardiology

## 2022-01-21 VITALS — BP 120/70 | HR 72 | Ht 63.0 in | Wt 171.2 lb

## 2022-01-21 DIAGNOSIS — I119 Hypertensive heart disease without heart failure: Secondary | ICD-10-CM | POA: Diagnosis not present

## 2022-01-21 DIAGNOSIS — E785 Hyperlipidemia, unspecified: Secondary | ICD-10-CM

## 2022-01-21 DIAGNOSIS — E1159 Type 2 diabetes mellitus with other circulatory complications: Secondary | ICD-10-CM

## 2022-01-21 DIAGNOSIS — I25118 Atherosclerotic heart disease of native coronary artery with other forms of angina pectoris: Secondary | ICD-10-CM | POA: Diagnosis not present

## 2022-01-21 NOTE — Patient Instructions (Signed)
Medication Instructions:  ?Your physician recommends that you continue on your current medications as directed. Please refer to the Current Medication list given to you today. ? ?*If you need a refill on your cardiac medications before your next appointment, please call your pharmacy* ? ? ?Lab Work: ?none ?If you have labs (blood work) drawn today and your tests are completely normal, you will receive your results only by: ?MyChart Message (if you have MyChart) OR ?A paper copy in the mail ?If you have any lab test that is abnormal or we need to change your treatment, we will call you to review the results. ? ? ?Testing/Procedures: ?none ? ? ?Follow-Up: ?At [facility], you and your health needs are our priority.  As part of our continuing mission to provide you with exceptional heart care, we have created designated Provider Care Teams.  These Care Teams include your primary Cardiologist (physician) and Advanced Practice Providers (APPs -  Physician Assistants and Nurse Practitioners) who all work together to provide you with the care you need, when you need it. ? ?We recommend signing up for the patient portal called "MyChart".  Sign up information is provided on this After Visit Summary.  MyChart is used to connect with patients for Virtual Visits (Telemedicine).  Patients are able to view lab/test results, encounter notes, upcoming appointments, etc.  Non-urgent messages can be sent to your provider as well.   ?To learn more about what you can do with MyChart, go to [email or web address].   ? ?Your next appointment:   ?07/27/22 at 8:40 ? ?The format for your next appointment:   ?In Person ? ?Provider:   ?Larae Grooms, MD   ? ? ?Other Instructions ? ? ?

## 2022-01-21 NOTE — Progress Notes (Signed)
?  ?Cardiology Office Note ? ? ?Date:  01/21/2022  ? ?ID:  Suzanne Watkins, DOB 27-May-1951, MRN [id number] ? ?PCP:  Suzanne Render, NP  ? ? ?No chief complaint on file. ? ?CAD ? ?Wt Readings from Last 3 Encounters:  ?01/21/22 171 lb 3.2 oz (77.7 kg)  ?01/12/22 172 lb (78 kg)  ?08/09/21 172 lb (78 kg)  ?  ? ?  ?History of Present Illness: ?Suzanne Watkins is a 71 y.o. female  Who has had CAD.  She has had stents in all three coronary vessels.  Most recent were in 11/14. ?  ?She used to walk a lot on the [place].  She retired in Jan 2019, so she joined a senior center to exercise on the treadmill. ?  ?She had a broken leg in 11/19.  She had surgery in December 12/19.  She did PT.  No cardiac issues at that time.  ?  ?She had some SHOB in Jan 2021.  She has some intermittent tachycardia, when she checks her pulse ox.  CT scan: done showed no PE, mild emphysema. ?  ?In 10/21, she had an episode of left arm pain with SHOB.  She used a SL NTG and had some relief.  She had one episode of chest tightness again and used SL NTG.   ?  ?In 2021, Imdur was added and amlodipine was increased to 5 mg BID. BP improved. ?  ?The patient does not have symptoms concerning for COVID-19 infection (fever, chills, cough, or new shortness of breath).  ?  ?2021 cath resulted in PTCA of OM, unable to advance stent due to tortuosity from the radial approach.  ?  ?She had recurrent angina. Cath in 12/2020 showed: ?"Non-stenotic Mid LAD lesion was previously treated. ?Proximal OM1 stent was patent. ?Mid OM1 lesion is 90% stenosed. Tortuous vessel and area of severe disease. A drug-eluting stent was successfully placed using a STENT RESOLUTE ONYX 2.25X34, postdilated to > 2.5 mm. ?Post intervention, there is a 0% residual stenosis. ?RCA stents are patent. ?2nd RPL lesion is 80% stenosed. ?RPDA lesion is 50% stenosed. ?The left ventricular systolic function is normal. ?LV end diastolic pressure is normal. ?The left ventricular ejection fraction  is 55-65% by visual estimate. ?There is no aortic valve stenosis. ?Femoral approach allowed for much more support than prior radial attempt. ?  ?Continue aggressive secondary prevention.  Dual antiplatelet therapy for 6 months.  Watch overnight and plan for home tomorrow. " ?  ?She is having more exertional angina.  We changed Imdur to 60 mg BID.  She felt she had side effects with the 60 mg BID.  She instead took 90 mg in the AM and 30 mg in the PM.   ?  ?She has had her flu shot and omicron booster. ? ?Managing angina on 90 mg of Imdur.  In [place] had the visit with the doctor in [place] regarding drug-coated coronary balloon.  She declined the [facility] study due to the  1 in 3 chance of receiving standard balloon.   ? ?Denies :  Dizziness. Leg edema.  Orthopnea. Palpitations. Paroxysmal nocturnal dyspnea. Shortness of breath. Syncope.   ? ?Working on DM control.  ? ? ?Past Medical History:  ?Diagnosis Date  ? Anginal pain ([place])   ? Arthritis   ? "probably in my hands" (09/04/2013)  ? 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 Cancer [facility])   ? Phreesia 05/17/2020  ? Cervical cancer ([place]) 1986  ?  Coronary atherosclerosis of native coronary artery 09/04/2013  ? RCA and obtuse marginal stent/DES-2010  ? Diabetes mellitus without complication ([place])   ? Phreesia 05/17/2020  ? Fibromyalgia   ? "dx'd in 1994"  ? Full dentures   ? GERD (gastroesophageal reflux disease)   ? Hyperlipidemia   ? Hypertension   ? Myocardial infarction [facility])   ? Phreesia 05/17/2020  ? Obesity   ? Shortness of breath   ? "just related to angina I was having recently" (09/04/2013)  ? Type II diabetes mellitus ([place])   ? followed by pcp  ? Unstable angina ([place]) 09/04/2013  ? Wears glasses   ? ? ?Past Surgical History:  ?Procedure Laterality Date  ? CARPAL TUNNEL RELEASE Right 07/1992  ? CERVICAL CONE BIOPSY  03/1985  ? CORONARY ANGIOPLASTY WITH STENT PLACEMENT  07/2009, 08/2009; 09/04/2013  ? "1+1+2; total of 4" (09/04/2013)  ? CORONARY BALLOON ANGIOPLASTY  N/A 10/06/2020  ? Procedure: CORONARY BALLOON ANGIOPLASTY;  Surgeon: Jettie Booze, MD;  Location: [place] [facility];  Service: Cardiovascular;  Laterality: N/A;  ? CORONARY BALLOON ANGIOPLASTY N/A 07/30/2021  ? Procedure: CORONARY BALLOON ANGIOPLASTY;  Surgeon: Jettie Booze, MD;  Location: [place] [facility];  Service: Cardiovascular;  Laterality: N/A;  ? CORONARY STENT INTERVENTION N/A 01/15/2021  ? Procedure: CORONARY STENT INTERVENTION;  Surgeon: Jettie Booze, MD;  Location: [place] [facility];  Service: Cardiovascular;  Laterality: N/A;  ? FRACTURE SURGERY N/A   ? Phreesia 05/17/2020  ? LEFT HEART CATH AND CORONARY ANGIOGRAPHY N/A 10/06/2020  ? Procedure: LEFT HEART CATH AND CORONARY ANGIOGRAPHY;  Surgeon: Jettie Booze, MD;  Location: [place] [facility];  Service: Cardiovascular;  Laterality: N/A;  ? LEFT HEART CATH AND CORONARY ANGIOGRAPHY N/A 01/15/2021  ? Procedure: LEFT HEART CATH AND CORONARY ANGIOGRAPHY;  Surgeon: Jettie Booze, MD;  Location: [place] [facility];  Service: Cardiovascular;  Laterality: N/A;  ? LEFT HEART CATH AND CORONARY ANGIOGRAPHY N/A 07/30/2021  ? Procedure: LEFT HEART CATH AND CORONARY ANGIOGRAPHY;  Surgeon: Jettie Booze, MD;  Location: [place] [facility];  Service: Cardiovascular;  Laterality: N/A;  ? LEFT HEART CATHETERIZATION WITH CORONARY ANGIOGRAM N/A 09/04/2013  ? Procedure: LEFT HEART CATHETERIZATION WITH CORONARY ANGIOGRAM;  Surgeon: Candee Furbish, MD;  Location: [facility] CATH LAB;  Service: Cardiovascular;  Laterality: N/A;  ? MOHS SURGERY Left 05/2008  ? "nostril"  ? ORIF ANKLE FRACTURE Right 10/10/2018  ? Procedure: OPEN REDUCTION INTERNAL FIXATION (ORIF) ANKLE FRACTURE;  Surgeon: Rod Can, MD;  Location: [facility];  Service: Orthopedics;  Laterality: Right;  ? SHOULDER OPEN ROTATOR CUFF REPAIR Right 10/2004  ? "got 5 pins in" (09/04/2013)  ? TUBAL LIGATION Bilateral 1979  ? ? ? ?Current Outpatient Medications  ?Medication Sig Dispense  Refill  ? amLODipine (NORVASC) 5 MG tablet TAKE 1 TABLET(5 MG) BY MOUTH IN THE MORNING AND AT BEDTIME 180 tablet 1  ? aspirin 81 MG chewable tablet Chew 1 tablet (81 mg total) by mouth daily. 90 tablet 0  ? atorvastatin (LIPITOR) 80 MG tablet TAKE 1 TABLET(80 MG) BY MOUTH DAILY AT 6 PM 90 tablet 1  ? BRILINTA 90 MG TABS tablet TAKE 1 TABLET(90 MG) BY MOUTH TWICE DAILY 180 tablet 2  ? diphenhydrAMINE (BENADRYL) 50 MG capsule Take 50 mg by mouth at bedtime.    ? empagliflozin (JARDIANCE) 10 MG TABS tablet Take 1 tablet (10 mg total) by mouth daily before breakfast. 30 tablet 3  ? ezetimibe (ZETIA) 10  MG tablet TAKE 1 TABLET(10 MG) BY MOUTH DAILY 90 tablet 1  ? glucose blood (ACCU-CHEK GUIDE) test strip Use as instructed 300 each 12  ? guaiFENesin (MUCINEX) 600 MG 12 hr tablet Take 600 mg by mouth daily as needed for cough or to loosen phlegm.    ? isosorbide mononitrate (IMDUR) 60 MG 24 hr tablet Take 1.5 tablets (90 mg total) by mouth daily. 135 tablet 2  ? metoprolol succinate (TOPROL-XL) 25 MG 24 hr tablet TAKE 1 TABLET(25 MG) BY MOUTH DAILY 90 tablet 2  ? Multiple Vitamin (MULTIVITAMIN WITH MINERALS) TABS tablet Take 1 tablet by mouth at bedtime. Centrum Silver for Women    ? nitroGLYCERIN (NITROSTAT) 0.4 MG SL tablet Place 1 tablet (0.4 mg total) under the tongue every 5 (five) minutes as needed for chest pain. 25 tablet 3  ? omeprazole (PRILOSEC) 20 MG capsule Take 20 mg by mouth daily before breakfast.     ? SitaGLIPtin-MetFORMIN HCl (JANUMET XR) 50-1000 MG TB24 Take 2 tablets by mouth daily.    ? valsartan (DIOVAN) 320 MG tablet TAKE 1 TABLET(320 MG) BY MOUTH DAILY 90 tablet 1  ? ?No current facility-administered medications for this visit.  ? ? ?Allergies:   Plavix [clopidogrel bisulfate], Adhesive [tape], and Latex  ? ? ?Social History:  The patient  reports that she quit smoking about 15 years ago. Her smoking use included cigarettes. She has a 29.00 pack-year smoking history. She has never used smokeless  tobacco. She reports that she does not drink alcohol and does not use drugs.  ? ?Family History:  The patient's family history includes Cancer in her father; Diabetes in her daughter; Drug abuse in her br

## 2022-01-26 ENCOUNTER — Encounter (HOSPITAL_BASED_OUTPATIENT_CLINIC_OR_DEPARTMENT_OTHER): Payer: Self-pay | Admitting: Nurse Practitioner

## 2022-01-27 ENCOUNTER — Other Ambulatory Visit (HOSPITAL_BASED_OUTPATIENT_CLINIC_OR_DEPARTMENT_OTHER): Payer: Self-pay | Admitting: Nurse Practitioner

## 2022-01-27 DIAGNOSIS — E1159 Type 2 diabetes mellitus with other circulatory complications: Secondary | ICD-10-CM

## 2022-01-27 MED ORDER — JANUMET XR 50-1000 MG PO TB24: 2.0000 | ORAL_TABLET | Freq: Every morning | ORAL | 3 refills | Status: DC

## 2022-01-30 ENCOUNTER — Encounter: Payer: Self-pay | Admitting: Nurse Practitioner

## 2022-02-16 ENCOUNTER — Other Ambulatory Visit: Payer: Self-pay | Admitting: Interventional Cardiology

## 2022-04-07 ENCOUNTER — Other Ambulatory Visit: Payer: Self-pay

## 2022-04-07 MED ORDER — ATORVASTATIN CALCIUM 80 MG PO TABS: ORAL_TABLET | ORAL | 3 refills | Status: DC

## 2022-04-07 MED ORDER — EZETIMIBE 10 MG PO TABS: ORAL_TABLET | ORAL | 3 refills | Status: DC

## 2022-04-14 ENCOUNTER — Ambulatory Visit (HOSPITAL_BASED_OUTPATIENT_CLINIC_OR_DEPARTMENT_OTHER): Payer: Medicare PPO | Admitting: Nurse Practitioner

## 2022-04-14 ENCOUNTER — Encounter (HOSPITAL_BASED_OUTPATIENT_CLINIC_OR_DEPARTMENT_OTHER): Payer: Self-pay | Admitting: Nurse Practitioner

## 2022-04-14 VITALS — BP 115/52 | HR 76 | Ht 63.0 in | Wt 167.8 lb

## 2022-04-14 DIAGNOSIS — E1159 Type 2 diabetes mellitus with other circulatory complications: Secondary | ICD-10-CM | POA: Diagnosis not present

## 2022-04-14 DIAGNOSIS — E782 Mixed hyperlipidemia: Secondary | ICD-10-CM

## 2022-04-14 DIAGNOSIS — I25118 Atherosclerotic heart disease of native coronary artery with other forms of angina pectoris: Secondary | ICD-10-CM | POA: Diagnosis not present

## 2022-04-14 DIAGNOSIS — G6289 Other specified polyneuropathies: Secondary | ICD-10-CM

## 2022-04-14 DIAGNOSIS — I152 Hypertension secondary to endocrine disorders: Secondary | ICD-10-CM

## 2022-04-14 DIAGNOSIS — B379 Candidiasis, unspecified: Secondary | ICD-10-CM

## 2022-04-14 DIAGNOSIS — I10 Essential (primary) hypertension: Secondary | ICD-10-CM

## 2022-04-14 DIAGNOSIS — E785 Hyperlipidemia, unspecified: Secondary | ICD-10-CM

## 2022-04-14 DIAGNOSIS — E1169 Type 2 diabetes mellitus with other specified complication: Secondary | ICD-10-CM

## 2022-04-14 DIAGNOSIS — I209 Angina pectoris, unspecified: Secondary | ICD-10-CM

## 2022-04-14 DIAGNOSIS — I119 Hypertensive heart disease without heart failure: Secondary | ICD-10-CM

## 2022-04-14 DIAGNOSIS — I2510 Atherosclerotic heart disease of native coronary artery without angina pectoris: Secondary | ICD-10-CM

## 2022-04-14 DIAGNOSIS — I2583 Coronary atherosclerosis due to lipid rich plaque: Secondary | ICD-10-CM

## 2022-04-14 LAB — POCT UA - MICROALBUMIN
Albumin/Creatinine Ratio, Urine, POC: <30
Creatinine, POC: 200 mg/dL
Microalbumin Ur, POC: 10 mg/L

## 2022-04-14 MED ORDER — GABAPENTIN 100 MG PO CAPS: 100.0000 mg | ORAL_CAPSULE | Freq: Every day | ORAL | 3 refills | Status: DC

## 2022-04-14 MED ORDER — NYSTATIN 100000 UNIT/GM EX CREA: 1.0000 | TOPICAL_CREAM | Freq: Two times a day (BID) | CUTANEOUS | 6 refills | Status: DC

## 2022-04-14 MED ORDER — EMPAGLIFLOZIN 10 MG PO TABS: 10.0000 mg | ORAL_TABLET | Freq: Every day | ORAL | 3 refills | Status: DC

## 2022-04-14 NOTE — Patient Instructions (Addendum)
It was a pleasure seeing you today. I hope your time spent with [place] was pleasant and helpful. Please let us know if there is anything we can do to improve the service you receive.   We will get labs today to monitor the progression of your conditions. I will be in touch with you to let you know how these look. I would like you to continue your current medications. If we need to make changes based on your labs, I will let you know. I will send in the gabapentin for you to try at bedtime. You can take 100-'300mg'$  to see if it helps with your nerve pain. Let me know if this is not helping.      Important Office Information Lab Results If labs were ordered, please note that you will see results through [place] as soon as they come available from [place].  It takes up to 5 business days for the results to be routed to me and for me to review them once all of the lab results have come through from [facility]. I will make recommendations based on your results and send these through [place] or someone from the office will call you to discuss. If your labs are abnormal, we may contact you to schedule a visit to discuss the results and make recommendations.  If you have not heard from [place] within 5 business days or you have waited longer than a week and your lab results have not come through on [place], please feel free to call the office or send a message through [place] to follow-up on these labs.   Referrals If referrals were placed today, the office where the referral was sent will contact you either by phone or through [place] to set up scheduling. Please note that it can take up to a week for the referral office to contact you. If you do not hear from them in a week, please contact the referral office directly to inquire about scheduling.   Condition Treated If your condition worsens or you begin to have new symptoms, please schedule a follow-up appointment for further evaluation. If you are not sure if an  appointment is needed, you may call the office to leave a message for the nurse and someone will contact you with recommendations.  If you have an urgent or life threatening emergency, please do not call the office, but seek emergency evaluation by calling 911 or going to the nearest emergency room for evaluation.   MyChart and Phone Calls Please do not use MyChart for urgent messages. It may take up to 3 business days for MyChart messages to be read by staff and if they are unable to handle the request, an additional 3 business days for them to be routed to me and for my response.  Messages sent to the provider through [place] do not come directly to the provider, please allow time for these messages to be routed and for me to respond.  We get a large volume of MyChart messages daily and these are responded to in the order received.   For urgent messages, please call the office at [phone] and speak with the front office staff or leave a message on the line of my assistant for guidance.  We are seeing patients from the hours of 8:00 am through 5:00 pm and calls directly to the nurse may not be answered immediately due to seeing patients, but your call will be returned as soon as possible.  Phone  messages received after 4:00 PM Monday through Thursday may not be returned until the following business day. Phone messages received after 11:00 AM on Friday may not be returned until Monday.   After Hours We share on call hours with providers from other offices. If you have an urgent need after hours that cannot wait until the next business day, please contact the on call provider by calling the office number. A nurse will speak with you and contact the provider if needed for recommendations.  If you have an urgent or life threatening emergency after hours, please do not call the on call provider, but seek emergency evaluation by calling 911 or going to the nearest emergency room for evaluation.    Paperwork All paperwork requires a minimum of 5 days to complete and return to you or the designated personnel. Please keep this in mind when bringing in forms or sending requests for paperwork completion to the office.

## 2022-04-14 NOTE — Progress Notes (Signed)
Suzanne Keeler, DNP, AGNP-c [address] [address], [place] [address] [phone] Office [phone] Fax  ESTABLISHED PATIENT- Chronic Health and/or Follow-Up Visit  Blood pressure (!) 115/52, pulse 76, height '5\' 3"'$  (1.6 m), weight 167 lb 12.8 oz (76.1 kg), SpO2 100 %.  Follow-up (Pt here for f/u on new medication re-check )   HPI [facility] in [facility] for Suzanne Watkins  is a 71 y.o. year old female presenting today for evaluation and management of the following: 3 month f/u for chronic conditions DM At last visit was started on Jardiance for heart and kidney protection Janumet added back due to blood sugars not being well controlled on Jardiance alone She tells me that her blood sugars have been well controlled since she added back the Janumet She tells me that her A1c is typically less than 6, but the last few times it has been 6.1%. She is hoping to have it lower now She is working on exercise and walking around the house She is monitoring her diet closely Neuropathy She endorses paresthesias to her hands and feet These are worse in the night and keep her from sleeping well HTN She has been taking her medications as prescribed She denies headaches, weakness, frequent palpitations, shortness of breath, or LE edema She is not routinely monitoring her blood pressure at home CAD She tell me that she is able to walk 3.5 miles before she starts to experience angina When this occur she is able to sit down and rest and the symptoms resolve She reports that her cardiologist has been discussing a new stent procedure that may be an option for her once it is available.  She denies any current chest pain, shortness of breath, dizziness, headaches, vision changes, weakness.  ROS All ROS negative with exception of what is listed in HPI  PHYSICAL EXAM Physical Exam Vitals and nursing note reviewed.   Constitutional:      General: She is not in acute distress.    Appearance: Normal appearance.  HENT:     Head: Normocephalic.  Eyes:     Extraocular Movements: Extraocular movements intact.     Pupils: Pupils are equal, round, and reactive to light.  Neck:     Vascular: No carotid bruit.  Cardiovascular:     Rate and Rhythm: Normal rate and regular rhythm.     Pulses: Normal pulses.     Heart sounds: Normal heart sounds.  Pulmonary:     Effort: Pulmonary effort is normal.     Breath sounds: Normal breath sounds.  Abdominal:     General: Bowel sounds are normal. There is no distension.     Palpations: Abdomen is soft.     Tenderness: There is no abdominal tenderness. There is no guarding.  Musculoskeletal:     Cervical back: Normal range of motion.     Right lower leg: No edema.     Left lower leg: No edema.  Lymphadenopathy:     Cervical: No cervical adenopathy.  Skin:    General: Skin is warm and dry.     Capillary Refill: Capillary refill takes less than 2 seconds.  Neurological:     General: No focal deficit present.     Mental Status: She is alert and oriented to person, place, and time.     Motor: Weakness present.     Coordination: Coordination normal.     Gait: Gait normal.  Comments: Mild weakness is present however no change from baseline.  Psychiatric:        Mood and Affect: Mood normal.        Behavior: Behavior normal.        Thought Content: Thought content normal.        Judgment: Judgment normal.     ASSESSMENT & PLAN Problem List Items Addressed This Visit     Diabetes ([place]) - Primary    Chronic.  Currently well controlled on Jardiance and Janumet. Encourage continuation of daily exercise and dietary monitoring to ensure low-sodium, low-fat low carbohydrate diet is adherent to.  Recommend continuation of monitoring blood sugars.  Notify immediately if blood sugars are not well controlled to discuss medication change options. We will obtain labs  today for evaluation and make changes to the plan of care as necessary based on findings.      Relevant Medications   empagliflozin (JARDIANCE) 10 MG TABS tablet   Other Relevant Orders   Hemoglobin A1c (Completed)   CBC with Differential/Platelet (Completed)   Comprehensive metabolic panel (Completed)   Lipid panel (Completed)   POCT UA - Microalbumin (Completed)   Hyperlipidemia associated with type 2 diabetes mellitus (HCC)    Chronic.  Blood sugars currently well controlled.  Currently on statin therapy.  Blood pressure well controlled at this time as well.  No alarm symptoms are present at this time.  Encourage continued exercise and diet control.  We will obtain labs today for evaluation.      Relevant Medications   empagliflozin (JARDIANCE) 10 MG TABS tablet   Other Relevant Orders   Hemoglobin A1c (Completed)   CBC with Differential/Platelet (Completed)   Comprehensive metabolic panel (Completed)   Lipid panel (Completed)   POCT UA - Microalbumin (Completed)   Coronary atherosclerosis of native coronary artery    Chronic.  Currently followed by cardiology.  Diabetes, hypertension,, hyperlipidemia all managed appropriately.  Recommend continue with daily physical activity even just walking around the house.  Dietary recommendations provided. Continue to collaborate care with cardiology.      Relevant Medications   empagliflozin (JARDIANCE) 10 MG TABS tablet   Hypertension associated with diabetes (HCC)    Chronic.  Well-controlled on current regimen.  No alarm symptoms present today. We will obtain labs today for evaluation. Recommend continued blood pressure monitoring at home.      Relevant Medications   empagliflozin (JARDIANCE) 10 MG TABS tablet   Other Relevant Orders   Hemoglobin A1c (Completed)   CBC with Differential/Platelet (Completed)   Comprehensive metabolic panel (Completed)   Lipid panel (Completed)   POCT UA - Microalbumin (Completed)   Angina  pectoris (HCC)    Chronic.  Stable at this time.  Intermittent angina with activity which resolves at rest is reassuring.  Has not had to use nitroglycerin recently.  No symptoms present today.  Encourage patient to monitor closely and report immediately any new or worsening signs of chest pain or chest pain that does not resolve with rest immediately.      Relevant Orders   Hemoglobin A1c (Completed)   CBC with Differential/Platelet (Completed)   Comprehensive metabolic panel (Completed)   Lipid panel (Completed)   POCT UA - Microalbumin (Completed)   Other Visit Diagnoses     Coronary artery disease of native artery of native heart with stable angina pectoris (HCC)       Relevant Medications   gabapentin (NEURONTIN) 100 MG capsule   Other Relevant Orders  Hemoglobin A1c (Completed)   CBC with Differential/Platelet (Completed)   Comprehensive metabolic panel (Completed)   Lipid panel (Completed)   POCT UA - Microalbumin (Completed)   Other polyneuropathy       Relevant Medications   gabapentin (NEURONTIN) 100 MG capsule   Primary hypertension       Relevant Medications   empagliflozin (JARDIANCE) 10 MG TABS tablet   Mixed hyperlipidemia       Relevant Medications   empagliflozin (JARDIANCE) 10 MG TABS tablet   Coronary artery disease due to lipid rich plaque       Relevant Medications   empagliflozin (JARDIANCE) 10 MG TABS tablet   Hypertensive heart disease without heart failure       Relevant Medications   empagliflozin (JARDIANCE) 10 MG TABS tablet   Candida infection       Relevant Medications   nystatin cream (MYCOSTATIN)        FOLLOW-UP Return in about 6 months (around 10/14/2022) for HTN, HLD, DM, Neuropathy.  Suzanne Keeler, DNP, AGNP-c

## 2022-04-15 ENCOUNTER — Encounter (HOSPITAL_BASED_OUTPATIENT_CLINIC_OR_DEPARTMENT_OTHER): Payer: Self-pay | Admitting: Nurse Practitioner

## 2022-04-15 LAB — COMPREHENSIVE METABOLIC PANEL
ALT: 31 IU/L (ref 0–32)
AST: 34 IU/L (ref 0–40)
Albumin/Globulin Ratio: 1.9 (ref 1.2–2.2)
Albumin: 4.6 g/dL (ref 3.8–4.8)
Alkaline Phosphatase: 81 IU/L (ref 44–121)
BUN/Creatinine Ratio: 18 (ref 12–28)
BUN: 17 mg/dL (ref 8–27)
Bilirubin Total: 0.5 mg/dL (ref 0.0–1.2)
CO2: 20 mmol/L (ref 20–29)
Calcium: 10.1 mg/dL (ref 8.7–10.3)
Chloride: 99 mmol/L (ref 96–106)
Creatinine, Ser: 0.93 mg/dL (ref 0.57–1.00)
Globulin, Total: 2.4 g/dL (ref 1.5–4.5)
Glucose: 120 mg/dL — ABNORMAL HIGH (ref 70–99)
Potassium: 4.7 mmol/L (ref 3.5–5.2)
Sodium: 138 mmol/L (ref 134–144)
Total Protein: 7 g/dL (ref 6.0–8.5)
eGFR: 67 mL/min/{1.73_m2} (ref >59)

## 2022-04-15 LAB — LIPID PANEL
Chol/HDL Ratio: 3.2 ratio (ref 0.0–4.4)
Cholesterol, Total: 112 mg/dL (ref 100–199)
HDL: 35 mg/dL — ABNORMAL LOW (ref >39)
LDL Chol Calc (NIH): 52 mg/dL (ref 0–99)
Triglycerides: 145 mg/dL (ref 0–149)
VLDL Cholesterol Cal: 25 mg/dL (ref 5–40)

## 2022-04-15 LAB — CBC WITH DIFFERENTIAL/PLATELET
Basophils Absolute: 0.1 10*3/uL (ref 0.0–0.2)
Basos: 1 %
EOS (ABSOLUTE): 0.2 10*3/uL (ref 0.0–0.4)
Eos: 3 %
Hematocrit: 41.1 % (ref 34.0–46.6)
Hemoglobin: 13.8 g/dL (ref 11.1–15.9)
Immature Grans (Abs): 0 10*3/uL (ref 0.0–0.1)
Immature Granulocytes: 0 %
Lymphocytes Absolute: 1.6 10*3/uL (ref 0.7–3.1)
Lymphs: 20 %
MCH: 30.9 pg (ref 26.6–33.0)
MCHC: 33.6 g/dL (ref 31.5–35.7)
MCV: 92 fL (ref 79–97)
Monocytes Absolute: 0.8 10*3/uL (ref 0.1–0.9)
Monocytes: 10 %
Neutrophils Absolute: 5 10*3/uL (ref 1.4–7.0)
Neutrophils: 66 %
Platelets: 266 10*3/uL (ref 150–450)
RBC: 4.46 x10E6/uL (ref 3.77–5.28)
RDW: 12.9 % (ref 11.7–15.4)
WBC: 7.7 10*3/uL (ref 3.4–10.8)

## 2022-04-15 LAB — HEMOGLOBIN A1C
Est. average glucose Bld gHb Est-mCnc: 123 mg/dL
Hgb A1c MFr Bld: 5.9 % — ABNORMAL HIGH (ref 4.8–5.6)

## 2022-04-28 NOTE — Assessment & Plan Note (Signed)
Chronic.  Blood sugars currently well controlled.  Currently on statin therapy.  Blood pressure well controlled at this time as well.  No alarm symptoms are present at this time.  Encourage continued exercise and diet control.  We will obtain labs today for evaluation.

## 2022-04-28 NOTE — Assessment & Plan Note (Signed)
Chronic.  Currently followed by cardiology.  Diabetes, hypertension,, hyperlipidemia all managed appropriately.  Recommend continue with daily physical activity even just walking around the house.  Dietary recommendations provided. Continue to collaborate care with cardiology.

## 2022-04-28 NOTE — Assessment & Plan Note (Signed)
Chronic.  Currently well controlled on Jardiance and Janumet. Encourage continuation of daily exercise and dietary monitoring to ensure low-sodium, low-fat low carbohydrate diet is adherent to.  Recommend continuation of monitoring blood sugars.  Notify immediately if blood sugars are not well controlled to discuss medication change options. We will obtain labs today for evaluation and make changes to the plan of care as necessary based on findings.

## 2022-04-28 NOTE — Assessment & Plan Note (Signed)
Chronic.  Well-controlled on current regimen.  No alarm symptoms present today. We will obtain labs today for evaluation. Recommend continued blood pressure monitoring at home.

## 2022-04-28 NOTE — Assessment & Plan Note (Signed)
Chronic.  Stable at this time.  Intermittent angina with activity which resolves at rest is reassuring.  Has not had to use nitroglycerin recently.  No symptoms present today.  Encourage patient to monitor closely and report immediately any new or worsening signs of chest pain or chest pain that does not resolve with rest immediately.

## 2022-05-13 ENCOUNTER — Encounter: Payer: Self-pay | Admitting: Interventional Cardiology

## 2022-05-15 ENCOUNTER — Other Ambulatory Visit: Payer: Self-pay | Admitting: Interventional Cardiology

## 2022-07-22 ENCOUNTER — Other Ambulatory Visit: Payer: Self-pay | Admitting: Interventional Cardiology

## 2022-07-27 ENCOUNTER — Ambulatory Visit: Payer: Medicare PPO | Attending: Interventional Cardiology | Admitting: Interventional Cardiology

## 2022-07-27 ENCOUNTER — Encounter: Payer: Self-pay | Admitting: Interventional Cardiology

## 2022-07-27 VITALS — BP 128/64 | HR 74 | Ht 63.0 in | Wt 173.6 lb

## 2022-07-27 DIAGNOSIS — E782 Mixed hyperlipidemia: Secondary | ICD-10-CM

## 2022-07-27 DIAGNOSIS — I10 Essential (primary) hypertension: Secondary | ICD-10-CM

## 2022-07-27 DIAGNOSIS — I252 Old myocardial infarction: Secondary | ICD-10-CM

## 2022-07-27 DIAGNOSIS — E1159 Type 2 diabetes mellitus with other circulatory complications: Secondary | ICD-10-CM | POA: Diagnosis not present

## 2022-07-27 DIAGNOSIS — I25118 Atherosclerotic heart disease of native coronary artery with other forms of angina pectoris: Secondary | ICD-10-CM

## 2022-07-27 MED ORDER — NITROGLYCERIN 0.4 MG SL SUBL: 0.4000 mg | SUBLINGUAL_TABLET | SUBLINGUAL | 3 refills | Status: DC | PRN

## 2022-07-27 NOTE — Progress Notes (Signed)
Cardiology Office Note   Date:  07/27/2022   ID:  Suzanne Watkins, DOB Dec 25, 1950, MRN [id number]  PCP:  Orma Render, NP    No chief complaint on file.  CAD  Wt Readings from Last 3 Encounters:  04/14/22 167 lb 12.8 oz (76.1 kg)  01/21/22 171 lb 3.2 oz (77.7 kg)  01/12/22 172 lb (78 kg)       History of Present Illness: Suzanne Watkins is a 71 y.o. female   Who has had CAD.  She has had stents in all three coronary vessels.  Most recent were in 11/14.   She used to walk a lot on the [employer]  She retired in Jan 2019, so she joined a senior center to exercise on the treadmill.   She had a broken leg in 11/19.  She had surgery in December 12/19.  She did PT.  No cardiac issues at that time.    She had some SHOB in Jan 2021.  She has some intermittent tachycardia, when she checks her pulse ox.  CT scan: done showed no PE, mild emphysema.   In 10/21, she had an episode of left arm pain with SHOB.  She used a SL NTG and had some relief.  She had one episode of chest tightness again and used SL NTG.     In 2021, Imdur was added and amlodipine was increased to 5 mg BID. BP improved.  2021 cath resulted in PTCA of OM, unable to advance stent due to tortuosity from the radial approach.    She had recurrent angina. Cath in 12/2020 showed: "Non-stenotic Mid LAD lesion was previously treated. Proximal OM1 stent was patent. Mid OM1 lesion is 90% stenosed. Tortuous vessel and area of severe disease. A drug-eluting stent was successfully placed using a STENT RESOLUTE ONYX 2.25X34, postdilated to > 2.5 mm. Post intervention, there is a 0% residual stenosis. RCA stents are patent. 2nd RPL lesion is 80% stenosed. RPDA lesion is 50% stenosed. The left ventricular systolic function is normal. LV end diastolic pressure is normal. The left ventricular ejection fraction is 55-65% by visual estimate. There is no aortic valve stenosis. Femoral approach allowed for much more support  than prior radial attempt.   Continue aggressive secondary prevention.  Dual antiplatelet therapy for 6 months.  Watch overnight and plan for home tomorrow. "   She had more exertional angina.  We changed Imdur to 60 mg BID.  She felt she had side effects with the 60 mg BID.  She instead took 90 mg in the AM and 30 mg in the PM.     Managing angina on 90 mg of Imdur.  In [place] had the visit with the doctor in [place] regarding drug-coated coronary balloon.  She declined the [facility] study due to the  1 in 3 chance of receiving standard balloon.    She went to [employer] in [place].  She was able to navigate walking the park and managing her angina.  She averages 17,000 steps per day.  Denies :  Dizziness. Leg edema.  Orthopnea. Paroxysmal nocturnal dyspnea. Shortness of breath. Syncope.     Rare palpitations- improving.  Rare NTG use.  Angina stable with IMdur ad SL NTG.    Still awaiting DCB availability.     Past Medical History:  Diagnosis Date   Anginal pain ([place])    Arthritis    "probably in my hands" (09/04/2013)   Basal cell carcinoma of nostril  05/2008   Cancer ([place])    Phreesia 05/17/2020   Cervical cancer ([place]) 1986   Coronary atherosclerosis of native coronary artery 09/04/2013   RCA and obtuse marginal stent/DES-2010   Diabetes mellitus without complication ([place])    Phreesia 05/17/2020   Displaced fracture of distal end of right fibula 10/10/2018   Fibromyalgia    "dx'd in 1994"   Full dentures    GERD (gastroesophageal reflux disease)    Hyperlipidemia    Hypertension    Myocardial infarction ([place])    Phreesia 05/17/2020   Obesity    Shortness of breath    "just related to angina I was having recently" (09/04/2013)   Type II diabetes mellitus ([place])    followed by pcp   Unstable angina ([place]) 09/04/2013   Wears glasses     Past Surgical History:  Procedure Laterality Date   CARPAL TUNNEL RELEASE Right 07/1992   CERVICAL CONE BIOPSY  03/1985   CORONARY  ANGIOPLASTY WITH STENT PLACEMENT  07/2009, 08/2009; 09/04/2013   "1+1+2; total of 4" (09/04/2013)   CORONARY BALLOON ANGIOPLASTY N/A 10/06/2020   Procedure: CORONARY BALLOON ANGIOPLASTY;  Surgeon: Jettie Booze, MD;  Location: [facility];  Service: Cardiovascular;  Laterality: N/A;   CORONARY BALLOON ANGIOPLASTY N/A 07/30/2021   Procedure: CORONARY BALLOON ANGIOPLASTY;  Surgeon: Jettie Booze, MD;  Location: [facility];  Service: Cardiovascular;  Laterality: N/A;   CORONARY STENT INTERVENTION N/A 01/15/2021   Procedure: CORONARY STENT INTERVENTION;  Surgeon: Jettie Booze, MD;  Location: [facility];  Service: Cardiovascular;  Laterality: N/A;   FRACTURE SURGERY N/A    Phreesia 05/17/2020   LEFT HEART CATH AND CORONARY ANGIOGRAPHY N/A 10/06/2020   Procedure: LEFT HEART CATH AND CORONARY ANGIOGRAPHY;  Surgeon: Jettie Booze, MD;  Location: [facility];  Service: Cardiovascular;  Laterality: N/A;   LEFT HEART CATH AND CORONARY ANGIOGRAPHY N/A 01/15/2021   Procedure: LEFT HEART CATH AND CORONARY ANGIOGRAPHY;  Surgeon: Jettie Booze, MD;  Location: [facility];  Service: Cardiovascular;  Laterality: N/A;   LEFT HEART CATH AND CORONARY ANGIOGRAPHY N/A 07/30/2021   Procedure: LEFT HEART CATH AND CORONARY ANGIOGRAPHY;  Surgeon: Jettie Booze, MD;  Location: [facility];  Service: Cardiovascular;  Laterality: N/A;   LEFT HEART CATHETERIZATION WITH CORONARY ANGIOGRAM N/A 09/04/2013   Procedure: LEFT HEART CATHETERIZATION WITH CORONARY ANGIOGRAM;  Surgeon: Candee Furbish, MD;  Location: [facility];  Service: Cardiovascular;  Laterality: N/A;   MOHS SURGERY Left 05/2008   "nostril"   ORIF ANKLE FRACTURE Right 10/10/2018   Procedure: OPEN REDUCTION INTERNAL FIXATION (ORIF) ANKLE FRACTURE;  Surgeon: Rod Can, MD;  Location: [facility];  Service: Orthopedics;  Laterality: Right;   SHOULDER OPEN ROTATOR CUFF REPAIR Right 10/2004    "got 5 pins in" (09/04/2013)   TUBAL LIGATION Bilateral 1979     Current Outpatient Medications  Medication Sig Dispense Refill   amLODipine (NORVASC) 5 MG tablet TAKE 1 TABLET(5 MG) BY MOUTH IN THE MORNING AND AT BEDTIME 180 tablet 3   aspirin 81 MG chewable tablet Chew 1 tablet (81 mg total) by mouth daily. 90 tablet 0   atorvastatin (LIPITOR) 80 MG tablet TAKE 1 TABLET(80 MG) BY MOUTH DAILY AT 6 PM 90 tablet 3   diphenhydrAMINE (BENADRYL) 50 MG capsule Take 50 mg by mouth at bedtime.     ezetimibe (ZETIA) 10 MG tablet TAKE 1 TABLET(10 MG) BY MOUTH DAILY 90 tablet  3   gabapentin (NEURONTIN) 100 MG capsule Take 1-3 capsules (100-300 mg total) by mouth at bedtime. 270 capsule 3   glucose blood (ACCU-CHEK GUIDE) test strip Use as instructed 300 each 12   isosorbide mononitrate (IMDUR) 60 MG 24 hr tablet TAKE 1 AND 1/2 TABLETS(90 MG) BY MOUTH DAILY 135 tablet 3   metoprolol succinate (TOPROL-XL) 25 MG 24 hr tablet TAKE 1 TABLET(25 MG) BY MOUTH DAILY 90 tablet 2   Multiple Vitamin (MULTIVITAMIN WITH MINERALS) TABS tablet Take 1 tablet by mouth at bedtime. Centrum Silver for Women     nitroGLYCERIN (NITROSTAT) 0.4 MG SL tablet Place 1 tablet (0.4 mg total) under the tongue every 5 (five) minutes as needed for chest pain. 25 tablet 3   nystatin cream (MYCOSTATIN) Apply 1 Application topically 2 (two) times daily. As needed for yeast 30 g 6   omeprazole (PRILOSEC) 20 MG capsule Take 20 mg by mouth daily before breakfast.      SitaGLIPtin-MetFORMIN HCl (JANUMET XR) 50-1000 MG TB24 Take 2 tablets by mouth every morning. 180 tablet 3   ticagrelor (BRILINTA) 90 MG TABS tablet Take 1 tablet (90 mg total) by mouth 2 (two) times daily. Please keep upcoming appointment in September 2023 for future refills. Thank you 180 tablet 0   empagliflozin (JARDIANCE) 10 MG TABS tablet Take 1 tablet (10 mg total) by mouth daily before breakfast. 90 tablet 3   No current facility-administered medications for this visit.     Allergies:   Plavix [clopidogrel bisulfate], Adhesive [tape], and Latex    Social History:  The patient  reports that she quit smoking about 16 years ago. Her smoking use included cigarettes. She has a 29.00 pack-year smoking history. She has never used smokeless tobacco. She reports that she does not drink alcohol and does not use drugs.   Family History:  The patient's family history includes Cancer in her father; Diabetes in her daughter; Drug abuse in her brother and daughter; Heart disease in her daughter and paternal grandmother; Heart disease (age of onset: 33) in her mother; Hyperlipidemia in her brother and mother; Hypertension in her brother; Lupus in her daughter; Stroke in her paternal grandfather.    ROS:  Please see the history of present illness.   Otherwise, review of systems are positive for persistent angina.   All other systems are reviewed and negative.    PHYSICAL EXAM: VS:  Ht '5\' 3"'$  (1.6 m)   BMI 29.72 kg/m  , BMI Body mass index is 29.72 kg/m. GEN: Well nourished, well developed, in no acute distress HEENT: normal Neck: no JVD, carotid bruits, or masses Cardiac: RRR; no murmurs, rubs, or gallops,no edema  Respiratory:  clear to auscultation bilaterally, normal work of breathing GI: soft, nontender, nondistended, + BS MS: no deformity or atrophy Skin: warm and dry, no rash Neuro:  Strength and sensation are intact Psych: euthymic mood, full affect   EKG:   The ekg ordered today demonstrates normal sinus rhythm, nonspecific ST-T wave changes, no significant change from prior   Recent Labs: 04/14/2022: ALT 31; BUN 17; Creatinine, Ser 0.93; Hemoglobin 13.8; Platelets 266; Potassium 4.7; Sodium 138   Lipid Panel    Component Value Date/Time   CHOL 112 04/14/2022 0929   CHOL 127 10/28/2014 0801   TRIG 145 04/14/2022 0929   TRIG 192 (H) 10/28/2014 0801   HDL 35 (L) 04/14/2022 0929   HDL 30 (L) 10/28/2014 0801   CHOLHDL 3.2 04/14/2022 0929   CHOLHDL  3.8 08/08/2016 0731   VLDL 22 08/08/2016 0731   LDLCALC 52 04/14/2022 0929   LDLCALC 59 10/28/2014 0801     Other studies Reviewed: Additional studies/ records that were reviewed today with results demonstrating: Labs reviewed LDL 52 HDL 35.   ASSESSMENT AND PLAN:  CAD/old MI: Sx are stable.  Has to stop after a similar amount of exercise.  She does her ADLs.  Not using SL NTG for shopping and other activities. DCB seems that it would be the best treatment for her small OM restenosis, but still not available.  Hopefully, collateral flow form can help relieve symptoms.  She feels that symptoms are manageable at this time. Hyperlipidemia: Continue lipid-lowering therapy.  Continue atorvastatin and Zetia. DM: Stopped Jardiance due to perineal rash. Hypertensive heart disease: The current medical regimen is effective;  continue present plan and medications.    Current medicines are reviewed at length with the patient today.  The patient concerns regarding her medicines were addressed.  The following changes have been made:  No change  Labs/ tests ordered today include:  No orders of the defined types were placed in this encounter.   Recommend 150 minutes/week of aerobic exercise Low fat, low carb, high fiber diet recommended  Disposition:   FU in 6 months   Signed, Larae Grooms, MD  07/27/2022 8:39 AM    [facility] [address], [place], [place]  [address] Phone: [phone]; Fax: [phone]

## 2022-07-27 NOTE — Patient Instructions (Signed)
Medication Instructions:  Your physician recommends that you continue on your current medications as directed. Please refer to the Current Medication list given to you today.  *If you need a refill on your cardiac medications before your next appointment, please call your pharmacy*   Lab Work: none If you have labs (blood work) drawn today and your tests are completely normal, you will receive your results only by: MyChart Message (if you have MyChart) OR A paper copy in the mail If you have any lab test that is abnormal or we need to change your treatment, we will call you to review the results.   Testing/Procedures: none   Follow-Up: At [facility], you and your health needs are our priority.  As part of our continuing mission to provide you with exceptional heart care, we have created designated Provider Care Teams.  These Care Teams include your primary Cardiologist (physician) and Advanced Practice Providers (APPs -  Physician Assistants and Nurse Practitioners) who all work together to provide you with the care you need, when you need it.  We recommend signing up for the patient portal called "MyChart".  Sign up information is provided on this After Visit Summary.  MyChart is used to connect with patients for Virtual Visits (Telemedicine).  Patients are able to view lab/test results, encounter notes, upcoming appointments, etc.  Non-urgent messages can be sent to your provider as well.   To learn more about what you can do with MyChart, go to [email or web address].    Your next appointment:   6 month(s)  The format for your next appointment:   In Person  Provider:   Jayadeep Varanasi, MD     Other Instructions    Important Information About Sugar       

## 2022-08-11 ENCOUNTER — Other Ambulatory Visit: Payer: Self-pay | Admitting: Interventional Cardiology

## 2022-09-26 LAB — HM DIABETES EYE EXAM

## 2022-10-07 ENCOUNTER — Encounter (HOSPITAL_BASED_OUTPATIENT_CLINIC_OR_DEPARTMENT_OTHER): Payer: Self-pay | Admitting: Family Medicine

## 2022-10-11 ENCOUNTER — Other Ambulatory Visit (HOSPITAL_BASED_OUTPATIENT_CLINIC_OR_DEPARTMENT_OTHER): Payer: Self-pay

## 2022-10-11 DIAGNOSIS — E1159 Type 2 diabetes mellitus with other circulatory complications: Secondary | ICD-10-CM

## 2022-10-11 MED ORDER — ACCU-CHEK GUIDE VI STRP: ORAL_STRIP | 12 refills | Status: AC

## 2022-10-14 ENCOUNTER — Ambulatory Visit (HOSPITAL_BASED_OUTPATIENT_CLINIC_OR_DEPARTMENT_OTHER): Payer: Medicare PPO | Admitting: Nurse Practitioner

## 2022-10-17 ENCOUNTER — Other Ambulatory Visit: Payer: Self-pay | Admitting: Interventional Cardiology

## 2022-11-11 ENCOUNTER — Encounter (HOSPITAL_BASED_OUTPATIENT_CLINIC_OR_DEPARTMENT_OTHER): Payer: Self-pay | Admitting: Family Medicine

## 2022-11-22 ENCOUNTER — Encounter (HOSPITAL_BASED_OUTPATIENT_CLINIC_OR_DEPARTMENT_OTHER): Payer: Self-pay | Admitting: Family Medicine

## 2022-11-22 ENCOUNTER — Ambulatory Visit (HOSPITAL_BASED_OUTPATIENT_CLINIC_OR_DEPARTMENT_OTHER): Payer: Medicare PPO | Admitting: Family Medicine

## 2022-11-22 VITALS — BP 105/65 | HR 72 | Ht 63.0 in | Wt 174.7 lb

## 2022-11-22 DIAGNOSIS — I10 Essential (primary) hypertension: Secondary | ICD-10-CM | POA: Diagnosis not present

## 2022-11-22 DIAGNOSIS — E119 Type 2 diabetes mellitus without complications: Secondary | ICD-10-CM

## 2022-11-22 DIAGNOSIS — E785 Hyperlipidemia, unspecified: Secondary | ICD-10-CM

## 2022-11-22 NOTE — Assessment & Plan Note (Addendum)
Reports medication compliance, she is on Janumet 50-1000 mg 2 tablets daily.  Her last A1c on April 14, 2022 was 5.9, she monitors her blood sugars at home.  Fasting this morning was 130, she reports her usual fasting is 145-155.   Blood sugars before meals is 140, and 2 hours after meal is less than 180. Well-controlled on current medications.  Her last dilated eye exam was September 26, 2022 will get hemoglobin A1c and CMP today.  No refills needed.  She will follow-up in 6 months with PCP with repeat A1C.

## 2022-11-22 NOTE — Progress Notes (Signed)
Established Patient Office Visit  Subjective   Patient ID: Suzanne Watkins, female    DOB: 1951-07-29  Age: 72 y.o. MRN: [id number]  Chief Complaint  Patient presents with   Follow-up    Pt here for f/u on HTN, HLD and DM     HPI Hypertension Medication compliance: taking medications as prescribed. Denies missing doses. Amlodipine 5 mg BID Denies  shortness of breath, lower extremity edema, vision changes, headaches. May have chest pain if exerts > 3 minutes, cards watching this. Rest will relieve pain.  Pertinent lab work: will get CMP and CBC today.   Monitoring: sees cards Q 6 months for monitoring and refills Tolerating medication well: tolerating well Continue current medication regimen: amlodipine 5 mg BID  Follow-up: 6 months with cards, April 9th   Hyperlipidemia:  Medication compliance: Lipitor 10 mg daily, not missing doses.  Denies lower extremity edema, myalgias, muscle weakness, changes in appearance of urine.  Pertinent lab work: will get lipid panel today (she is fasting).  Monitoring: Q 6 months Continue current medication regimen: Lipitor 10 mg daily Follow-up: April with cards  Diabetes: Medication compliance: Janumet 50-1000 mg 2 tablets daily.  Denies  shortness of breath, vision changes, polydipsia, polyphagia, polyuria.  Pertinent lab work: Last A1C on 04/14/22:  5.9   Monitoring: blood sugar readings at home:130 this morning, 145-155 fasting average.  Before meals 140, 2 hours after meal < 180.  Continue current medication regimen: Janumet 50-1000 mg 2 tablets Daily  Last dilated eye exam: last eye appointment 09/26/22 Well controlled: well controlled based on last A1C. Medication adjustments if needed once labs are back.  Follow-up: Q 6 months with PCP.   Chart review: labs on 04/14/22.  Last cardiology visit on 07/27/22, aware of chest pain with exertion. Follows up in 6 months due April 2024.    Review of Systems  Constitutional:  Negative for  chills and fever.  Eyes:  Negative for blurred vision and double vision.  Respiratory:  Negative for shortness of breath.   Cardiovascular:  Negative for chest pain and leg swelling.  Gastrointestinal:  Negative for nausea and vomiting.  Musculoskeletal:  Negative for myalgias.  Neurological:  Negative for headaches.      Objective:     BP 105/65 (BP Location: Left Arm, Patient Position: Sitting, Cuff Size: Large)   Pulse 72   Ht '5\' 3"'$  (1.6 m)   Wt 174 lb 11.2 oz (79.2 kg)   SpO2 99%   BMI 30.95 kg/m    Physical Exam Vitals and nursing note reviewed.  Constitutional:      General: She is not in acute distress.    Appearance: Normal appearance.  Cardiovascular:     Rate and Rhythm: Normal rate and regular rhythm.     Heart sounds: Normal heart sounds.  Pulmonary:     Effort: Pulmonary effort is normal.     Breath sounds: Normal breath sounds.  Musculoskeletal:        General: Normal range of motion.  Skin:    General: Skin is warm and dry.     Capillary Refill: Capillary refill takes less than 2 seconds.  Neurological:     General: No focal deficit present.     Mental Status: She is alert. Mental status is at baseline.  Psychiatric:        Mood and Affect: Mood normal.        Behavior: Behavior normal.        Thought Content:  Thought content normal.        Judgment: Judgment normal.      No results found for any visits on 11/22/22.    The ASCVD Risk score (Arnett DK, et al., 2019) failed to calculate for the following reasons:   The patient has a prior MI or stroke diagnosis    Assessment & Plan:   Problem List Items Addressed This Visit     Diabetes ([place])    Reports medication compliance, she is on Janumet 50-1000 mg 2 tablets daily.  Her last A1c on April 14, 2022 was 5.9, she monitors her blood sugars at home.  Fasting this morning was 130, she reports her usual fasting is 145-155.   Blood sugars before meals is 140, and 2 hours after meal is less than  180. Well-controlled on current medications.  Her last dilated eye exam was September 26, 2022 will get hemoglobin A1c and CMP today.  No refills needed.  She will follow-up in 6 months with PCP with repeat A1C.       Relevant Orders   CBC with Differential/Platelet   Hemoglobin A1c   Microalbumin / creatinine urine ratio   Hyperlipidemia    Tolerating Lipitor 10 mg daily, denies muscle weakness, muscle pain, changes in appearance of urine.  She is fasting we will get lipid panel today.  Hyperlipidemia is managed by cardiology, no changes to medications today.  She has a follow-up appointment in April.      Relevant Orders   Lipid panel   Essential hypertension - Primary    Blood pressure well-controlled on amlodipine 5 mg 2 times per day.  This is being managed by cardiology has a follow-up appointment in April.  Denies shortness of breath, lower extremity edema, vision changes and headaches.  She reports that she may have chest pain if she exerts herself greater than 3 minutes, cardiology is monitoring this and  reports pain is resolved with rest.  We will get CMP and CBC today.  No changes to medications, as this is managed by cardiology.      Relevant Orders   CBC with Differential/Platelet   Comprehensive metabolic panel  Appointment with Dr. Irish Lack on 02/07/23 for medication management for HTN and HLD.   Return in about 6 months (around 05/23/2023) for diabetes.    Chalmers Guest, FNP

## 2022-11-22 NOTE — Assessment & Plan Note (Signed)
Blood pressure well-controlled on amlodipine 5 mg 2 times per day.  This is being managed by cardiology has a follow-up appointment in April.  Denies shortness of breath, lower extremity edema, vision changes and headaches.  She reports that she may have chest pain if she exerts herself greater than 3 minutes, cardiology is monitoring this and  reports pain is resolved with rest.  We will get CMP and CBC today.  No changes to medications, as this is managed by cardiology.

## 2022-11-22 NOTE — Assessment & Plan Note (Signed)
Tolerating Lipitor 10 mg daily, denies muscle weakness, muscle pain, changes in appearance of urine.  She is fasting we will get lipid panel today.  Hyperlipidemia is managed by cardiology, no changes to medications today.  She has a follow-up appointment in April.

## 2022-11-23 LAB — LIPID PANEL
Chol/HDL Ratio: 3.8 ratio (ref 0.0–4.4)
Cholesterol, Total: 114 mg/dL (ref 100–199)
HDL: 30 mg/dL — ABNORMAL LOW (ref >39)
LDL Chol Calc (NIH): 58 mg/dL (ref 0–99)
Triglycerides: 149 mg/dL (ref 0–149)
VLDL Cholesterol Cal: 26 mg/dL (ref 5–40)

## 2022-11-23 LAB — CBC WITH DIFFERENTIAL/PLATELET
Basophils Absolute: 0.1 10*3/uL (ref 0.0–0.2)
Basos: 1 %
EOS (ABSOLUTE): 0.1 10*3/uL (ref 0.0–0.4)
Eos: 2 %
Hematocrit: 37.4 % (ref 34.0–46.6)
Hemoglobin: 12.6 g/dL (ref 11.1–15.9)
Immature Grans (Abs): 0 10*3/uL (ref 0.0–0.1)
Immature Granulocytes: 1 %
Lymphocytes Absolute: 1.3 10*3/uL (ref 0.7–3.1)
Lymphs: 20 %
MCH: 31 pg (ref 26.6–33.0)
MCHC: 33.7 g/dL (ref 31.5–35.7)
MCV: 92 fL (ref 79–97)
Monocytes Absolute: 0.5 10*3/uL (ref 0.1–0.9)
Monocytes: 8 %
Neutrophils Absolute: 4.3 10*3/uL (ref 1.4–7.0)
Neutrophils: 68 %
Platelets: 217 10*3/uL (ref 150–450)
RBC: 4.07 x10E6/uL (ref 3.77–5.28)
RDW: 12.9 % (ref 11.7–15.4)
WBC: 6.3 10*3/uL (ref 3.4–10.8)

## 2022-11-23 LAB — COMPREHENSIVE METABOLIC PANEL
ALT: 37 IU/L — ABNORMAL HIGH (ref 0–32)
AST: 34 IU/L (ref 0–40)
Albumin/Globulin Ratio: 2 (ref 1.2–2.2)
Albumin: 4.3 g/dL (ref 3.9–4.9)
Alkaline Phosphatase: 79 IU/L (ref 44–121)
BUN/Creatinine Ratio: 16 (ref 12–28)
BUN: 14 mg/dL (ref 8–27)
Bilirubin Total: 0.4 mg/dL (ref 0.0–1.2)
CO2: 20 mmol/L (ref 20–29)
Calcium: 9.9 mg/dL (ref 8.7–10.3)
Chloride: 104 mmol/L (ref 96–106)
Creatinine, Ser: 0.88 mg/dL (ref 0.57–1.00)
Globulin, Total: 2.2 g/dL (ref 1.5–4.5)
Glucose: 125 mg/dL — ABNORMAL HIGH (ref 70–99)
Potassium: 4.5 mmol/L (ref 3.5–5.2)
Sodium: 138 mmol/L (ref 134–144)
Total Protein: 6.5 g/dL (ref 6.0–8.5)
eGFR: 71 mL/min/{1.73_m2} (ref >59)

## 2022-11-23 LAB — MICROALBUMIN / CREATININE URINE RATIO
Creatinine, Urine: 81.6 mg/dL
Microalb/Creat Ratio: 32 mg/g creat — ABNORMAL HIGH (ref 0–29)
Microalbumin, Urine: 26.5 ug/mL

## 2022-11-23 LAB — HEMOGLOBIN A1C
Est. average glucose Bld gHb Est-mCnc: 140 mg/dL
Hgb A1c MFr Bld: 6.5 % — ABNORMAL HIGH (ref 4.8–5.6)

## 2022-11-25 ENCOUNTER — Ambulatory Visit (HOSPITAL_BASED_OUTPATIENT_CLINIC_OR_DEPARTMENT_OTHER): Payer: Medicare PPO | Admitting: Family Medicine

## 2022-12-20 ENCOUNTER — Encounter: Payer: Self-pay | Admitting: Family Medicine

## 2022-12-23 NOTE — Telephone Encounter (Signed)
Contacted Suzanne Watkins to schedule their annual wellness visit. Patient declined to schedule AWV at this time. [address] [place], LPN [place] Team Direct Dial: [phone]

## 2023-01-15 ENCOUNTER — Other Ambulatory Visit (HOSPITAL_BASED_OUTPATIENT_CLINIC_OR_DEPARTMENT_OTHER): Payer: Self-pay | Admitting: Nurse Practitioner

## 2023-01-15 DIAGNOSIS — E1159 Type 2 diabetes mellitus with other circulatory complications: Secondary | ICD-10-CM

## 2023-02-04 ENCOUNTER — Other Ambulatory Visit: Payer: Self-pay | Admitting: Interventional Cardiology

## 2023-02-06 NOTE — Progress Notes (Unsigned)
Cardiology Office Note   Date:  02/07/2023   ID:  Suzanne Watkins, DOB 12-25-1950, MRN [id number]  PCP:  de [place], Raymond J, MD    No chief complaint on file.  CAD  Wt Readings from Last 3 Encounters:  02/07/23 173 lb 6.4 oz (78.7 kg)  11/22/22 174 lb 11.2 oz (79.2 kg)  07/27/22 173 lb 9.6 oz (78.7 kg)       History of Present Illness: Suzanne Watkins is a 72 y.o. female  Who has had CAD.  She has had stents in all three coronary vessels. First stent in 2009.  Most recent were in 11/14.   She used to walk a lot on the [employer].  She retired in Jan 2019, so she joined a senior center to exercise on the treadmill.   She had a broken leg in 11/19.  She had surgery in December 12/19.  She did PT.  No cardiac issues at that time.    She had some SHOB in Jan 2021.  She has some intermittent tachycardia, when she checks her pulse ox.  CT scan: done showed no PE, mild emphysema.   In 10/21, she had an episode of left arm pain with SHOB.  She used a SL NTG and had some relief.  She had one episode of chest tightness again and used SL NTG.     In 2021, Imdur was added and amlodipine was increased to 5 mg BID. BP improved.   2021 cath resulted in PTCA of OM, unable to advance stent due to tortuosity from the radial approach.    She had recurrent angina. Cath in 12/2020 showed: "Non-stenotic Mid LAD lesion was previously treated. Proximal OM1 stent was patent. Mid OM1 lesion is 90% stenosed. Tortuous vessel and area of severe disease. A drug-eluting stent was successfully placed using a STENT RESOLUTE ONYX 2.25X34, postdilated to > 2.5 mm. Post intervention, there is a 0% residual stenosis. RCA stents are patent. 2nd RPL lesion is 80% stenosed. RPDA lesion is 50% stenosed. The left ventricular systolic function is normal. LV end diastolic pressure is normal. The left ventricular ejection fraction is 55-65% by visual estimate. There is no aortic valve stenosis. Femoral  approach allowed for much more support than prior radial attempt.   Continue aggressive secondary prevention.  Dual antiplatelet therapy for 6 months.  Watch overnight and plan for home tomorrow. "   She had more exertional angina.  We changed Imdur to 60 mg BID.  She felt she had side effects with the 60 mg BID.  She instead took 90 mg in the AM and 30 mg in the PM.     Managing angina on 90 mg of Imdur.  In [place] had the visit with the doctor in [place] regarding drug-coated coronary balloon.  She declined the [facility] study due to the  1 in 3 chance of receiving standard balloon.     In 2023, she went to [employer] in [place].  She was able to navigate walking the park and managing her angina.  She averages 17,000 steps per day.    As of 2023, she is awaiting drug-coated balloon availability.   Sx have been stable.  Angina after 5 minutes at walking.  Sx better in the morning.  She has to rest at times.    Hip pain also limits   Past Medical History:  Diagnosis Date   Anginal pain    Arthritis    "probably in  my hands" (09/04/2013)   Basal cell carcinoma of nostril 05/2008   Cancer    Phreesia 05/17/2020   Cervical cancer 1986   Coronary atherosclerosis of native coronary artery 09/04/2013   RCA and obtuse marginal stent/DES-2010   Diabetes mellitus without complication    Phreesia 05/17/2020   Displaced fracture of distal end of right fibula 10/10/2018   Fibromyalgia    "dx'd in 1994"   Full dentures    GERD (gastroesophageal reflux disease)    Hyperlipidemia    Hypertension    Myocardial infarction    Phreesia 05/17/2020   Obesity    Shortness of breath    "just related to angina I was having recently" (09/04/2013)   Type II diabetes mellitus    followed by pcp   Unstable angina 09/04/2013   Wears glasses     Past Surgical History:  Procedure Laterality Date   CARPAL TUNNEL RELEASE Right 07/1992   CERVICAL CONE BIOPSY  03/1985   CORONARY ANGIOPLASTY WITH STENT  PLACEMENT  07/2009, 08/2009; 09/04/2013   "1+1+2; total of 4" (09/04/2013)   CORONARY BALLOON ANGIOPLASTY N/A 10/06/2020   Procedure: CORONARY BALLOON ANGIOPLASTY;  Surgeon: Corky CraftsVaranasi, Jayke Caul S, MD;  Location: [facility];  Service: Cardiovascular;  Laterality: N/A;   CORONARY BALLOON ANGIOPLASTY N/A 07/30/2021   Procedure: CORONARY BALLOON ANGIOPLASTY;  Surgeon: Corky CraftsVaranasi, Jamahl Lemmons S, MD;  Location: [facility];  Service: Cardiovascular;  Laterality: N/A;   CORONARY STENT INTERVENTION N/A 01/15/2021   Procedure: CORONARY STENT INTERVENTION;  Surgeon: Corky CraftsVaranasi, Tiyana Galla S, MD;  [facility];  Service: Cardiovascular;  Laterality: N/A;   FRACTURE SURGERY N/A    Phreesia 05/17/2020   LEFT HEART CATH AND CORONARY ANGIOGRAPHY N/A 10/06/2020   Procedure: LEFT HEART CATH AND CORONARY ANGIOGRAPHY;  Surgeon: Corky CraftsVaranasi, Kazaria Gaertner S, MD;  [facility];  Service: Cardiovascular;  Laterality: N/A;   LEFT HEART CATH AND CORONARY ANGIOGRAPHY N/A 01/15/2021   Procedure: LEFT HEART CATH AND CORONARY ANGIOGRAPHY;  Surgeon: Corky CraftsVaranasi, Maitri Schnoebelen S, MD;  [facility];  Service: Cardiovascular;  Laterality: N/A;   LEFT HEART CATH AND CORONARY ANGIOGRAPHY N/A 07/30/2021   Procedure: LEFT HEART CATH AND CORONARY ANGIOGRAPHY;  Surgeon: Corky CraftsVaranasi, Donnisha Besecker S, MD;  [facility];  Service: Cardiovascular;  Laterality: N/A;   LEFT HEART CATHETERIZATION WITH CORONARY ANGIOGRAM N/A 09/04/2013   Procedure: LEFT HEART CATHETERIZATION WITH CORONARY ANGIOGRAM;  Surgeon: Donato SchultzMark Skains, MD;  [facility];  Service: Cardiovascular;  Laterality: N/A;   MOHS SURGERY Left 05/2008   "nostril"   ORIF ANKLE FRACTURE Right 10/10/2018   Procedure: OPEN REDUCTION INTERNAL FIXATION (ORIF) ANKLE FRACTURE;  Surgeon: Samson FredericSwinteck, Brian, MD;  Location: [facility];  Service: Orthopedics;  Laterality: Right;   SHOULDER OPEN ROTATOR CUFF REPAIR Right 10/2004   "got 5 pins in"  (09/04/2013)   TUBAL LIGATION Bilateral 1979     Current Outpatient Medications  Medication Sig Dispense Refill   amLODipine (NORVASC) 5 MG tablet TAKE 1 TABLET(5 MG) BY MOUTH IN THE MORNING AND AT BEDTIME 180 tablet 1   aspirin 81 MG chewable tablet Chew 1 tablet (81 mg total) by mouth daily. 90 tablet 0   atorvastatin (LIPITOR) 80 MG tablet TAKE 1 TABLET(80 MG) BY MOUTH DAILY AT 6 PM 90 tablet 3   cyanocobalamin (VITAMIN B12) 250 MCG tablet Take 250 mcg by mouth daily.     diphenhydrAMINE (BENADRYL) 50 MG capsule Take 50 mg by mouth  at bedtime.     ezetimibe (ZETIA) 10 MG tablet TAKE 1 TABLET(10 MG) BY MOUTH DAILY 90 tablet 3   gabapentin (NEURONTIN) 100 MG capsule Take 1-3 capsules (100-300 mg total) by mouth at bedtime. 270 capsule 3   glucose blood (ACCU-CHEK GUIDE) test strip Use as instructed 300 each 12   isosorbide mononitrate (IMDUR) 60 MG 24 hr tablet TAKE 1 AND 1/2 TABLETS(90 MG) BY MOUTH DAILY 135 tablet 3   KRILL OIL PO Take by mouth.     metoprolol succinate (TOPROL-XL) 25 MG 24 hr tablet TAKE 1 TABLET(25 MG) BY MOUTH DAILY 90 tablet 3   Multiple Vitamin (MULTIVITAMIN WITH MINERALS) TABS tablet Take 1 tablet by mouth at bedtime. Centrum Silver for Women     nitroGLYCERIN (NITROSTAT) 0.4 MG SL tablet Place 1 tablet (0.4 mg total) under the tongue every 5 (five) minutes as needed for chest pain. 25 tablet 3   omeprazole (PRILOSEC) 20 MG capsule Take 20 mg by mouth daily before breakfast.      SitaGLIPtin-MetFORMIN HCl (JANUMET XR) 50-1000 MG TB24 TAKE 2 TABLETS BY MOUTH EVERY MORNING 180 tablet 1   ticagrelor (BRILINTA) 90 MG TABS tablet Take 1 tablet (90 mg total) by mouth 2 (two) times daily. 180 tablet 2   nystatin cream (MYCOSTATIN) Apply 1 Application topically 2 (two) times daily. As needed for yeast (Patient not taking: Reported on 02/07/2023) 30 g 6   No current facility-administered medications for this visit.    Allergies:   Plavix [clopidogrel bisulfate], Adhesive  [tape], and Latex    Social History:  The patient  reports that she quit smoking about 16 years ago. Her smoking use included cigarettes. She has a 29.00 pack-year smoking history. She has never used smokeless tobacco. She reports that she does not drink alcohol and does not use drugs.   Family History:  The patient's family history includes Cancer in her father; Diabetes in her daughter; Drug abuse in her brother and daughter; Heart disease in her daughter and paternal grandmother; Heart disease (age of onset: 1) in her mother; Hyperlipidemia in her brother and mother; Hypertension in her brother; Lupus in her daughter; Stroke in her paternal grandfather.    ROS:  Please see the history of present illness.   Otherwise, review of systems are positive for sometimes forgets to get her exercise.   All other systems are reviewed and negative.    PHYSICAL EXAM: VS:  BP (!) 120/42   Pulse 79   Ht 5' 2.5" (1.588 m)   Wt 173 lb 6.4 oz (78.7 kg)   SpO2 98%   BMI 31.21 kg/m  , BMI Body mass index is 31.21 kg/m. GEN: Well nourished, well developed, in no acute distress HEENT: normal Neck: no JVD, carotid bruits, or masses Cardiac: RRR; no murmurs, rubs, or gallops,no edema  Respiratory:  clear to auscultation bilaterally, normal work of breathing GI: soft, nontender, nondistended, + BS MS: no deformity or atrophy Skin: warm and dry, no rash Neuro:  Strength and sensation are intact Psych: euthymic mood, full affect    Recent Labs: 11/22/2022: ALT 37; BUN 14; Creatinine, Ser 0.88; Hemoglobin 12.6; Platelets 217; Potassium 4.5; Sodium 138   Lipid Panel    Component Value Date/Time   CHOL 114 11/22/2022 1133   CHOL 127 10/28/2014 0801   TRIG 149 11/22/2022 1133   TRIG 192 (H) 10/28/2014 0801   HDL 30 (L) 11/22/2022 1133   HDL 30 (L) 10/28/2014 0801   CHOLHDL  3.8 11/22/2022 1133   CHOLHDL 3.8 08/08/2016 0731   VLDL 22 08/08/2016 0731   LDLCALC 58 11/22/2022 1133   LDLCALC 59  10/28/2014 0801     Other studies Reviewed: Additional studies/ records that were reviewed today with results demonstrating: labs reviewed.   ASSESSMENT AND PLAN:  CAD/old MI:'s which are likely consistent restenosis of her OM-manage medically due to lack of availability of drug-coated balloon.  No bleeding issues with Brilinta.  Hyperlipidemia: HDL 30 LDL 58 total cholesterol 114 triglycerides 149.  Started Krill oil. Diabetes: January 2024 A1c 6.5.  Trying to limit sweets more.   Hypertensive heart disease: The current medical regimen is effective;  continue present plan and medications.  113/70s typically at home.    Current medicines are reviewed at length with the patient today.  The patient concerns regarding her medicines were addressed.  The following changes have been made:  No change  Labs/ tests ordered today include:  No orders of the defined types were placed in this encounter.   Recommend 150 minutes/week of aerobic exercise Low fat, low carb, high fiber diet recommended  Disposition:   FU in 1 year   Signed, Lance Muss, MD  02/07/2023 10:07 AM    [facility] [address] [place], [place], [place]  [address] Phone: [phone]; Fax: [phone]

## 2023-02-07 ENCOUNTER — Encounter: Payer: Self-pay | Admitting: Interventional Cardiology

## 2023-02-07 ENCOUNTER — Ambulatory Visit: Payer: Medicare PPO | Attending: Interventional Cardiology | Admitting: Interventional Cardiology

## 2023-02-07 VITALS — BP 120/42 | HR 79 | Ht 62.5 in | Wt 173.4 lb

## 2023-02-07 DIAGNOSIS — E1159 Type 2 diabetes mellitus with other circulatory complications: Secondary | ICD-10-CM | POA: Diagnosis not present

## 2023-02-07 DIAGNOSIS — I25118 Atherosclerotic heart disease of native coronary artery with other forms of angina pectoris: Secondary | ICD-10-CM | POA: Diagnosis not present

## 2023-02-07 DIAGNOSIS — I10 Essential (primary) hypertension: Secondary | ICD-10-CM

## 2023-02-07 DIAGNOSIS — I252 Old myocardial infarction: Secondary | ICD-10-CM

## 2023-02-07 DIAGNOSIS — E782 Mixed hyperlipidemia: Secondary | ICD-10-CM | POA: Diagnosis not present

## 2023-02-07 NOTE — Patient Instructions (Signed)
Medication Instructions:  Your physician recommends that you continue on your current medications as directed. Please refer to the Current Medication list given to you today.  *If you need a refill on your cardiac medications before your next appointment, please call your pharmacy*   Lab Work: none If you have labs (blood work) drawn today and your tests are completely normal, you will receive your results only by: MyChart Message (if you have MyChart) OR A paper copy in the mail If you have any lab test that is abnormal or we need to change your treatment, we will call you to review the results.   Testing/Procedures: none   Follow-Up: At [facility], you and your health needs are our priority.  As part of our continuing mission to provide you with exceptional heart care, we have created designated Provider Care Teams.  These Care Teams include your primary Cardiologist (physician) and Advanced Practice Providers (APPs -  Physician Assistants and Nurse Practitioners) who all work together to provide you with the care you need, when you need it.  We recommend signing up for the patient portal called "MyChart".  Sign up information is provided on this After Visit Summary.  MyChart is used to connect with patients for Virtual Visits (Telemedicine).  Patients are able to view lab/test results, encounter notes, upcoming appointments, etc.  Non-urgent messages can be sent to your provider as well.   To learn more about what you can do with MyChart, go to [email or web address].    Your next appointment:   6-8 month(s)  Provider:   Jayadeep Varanasi, MD     Other Instructions    

## 2023-02-24 ENCOUNTER — Encounter: Payer: Self-pay | Admitting: Interventional Cardiology

## 2023-02-24 NOTE — Telephone Encounter (Signed)
Spoke with patient who reports she had been working in the attic for about 30 minutes when she became light headed and passed out. Patient states husband called 911 and she was checked out by EMS. BP and VS were normal. She states she feels fine now. She did send copies of rhythm strips in via My Chart. She wants to know if Dr Eldridge Dace would like her to come in to be checked or have any lab work.   Advised I would forward her message to Dr Eldridge Dace and provided education on when to call 911 or go to the ED. Patient verbalized understanding and had no questions.

## 2023-03-10 IMAGING — CR DG CHEST 2V
2 series · 2 of 2 positions shown · non-contrast
Comparison: PA Lat and CTA chest both 11/18/2019

CLINICAL DATA: Shortness of breath and chest pain.

EXAM:
CHEST - 2 VIEW

[chest pa]
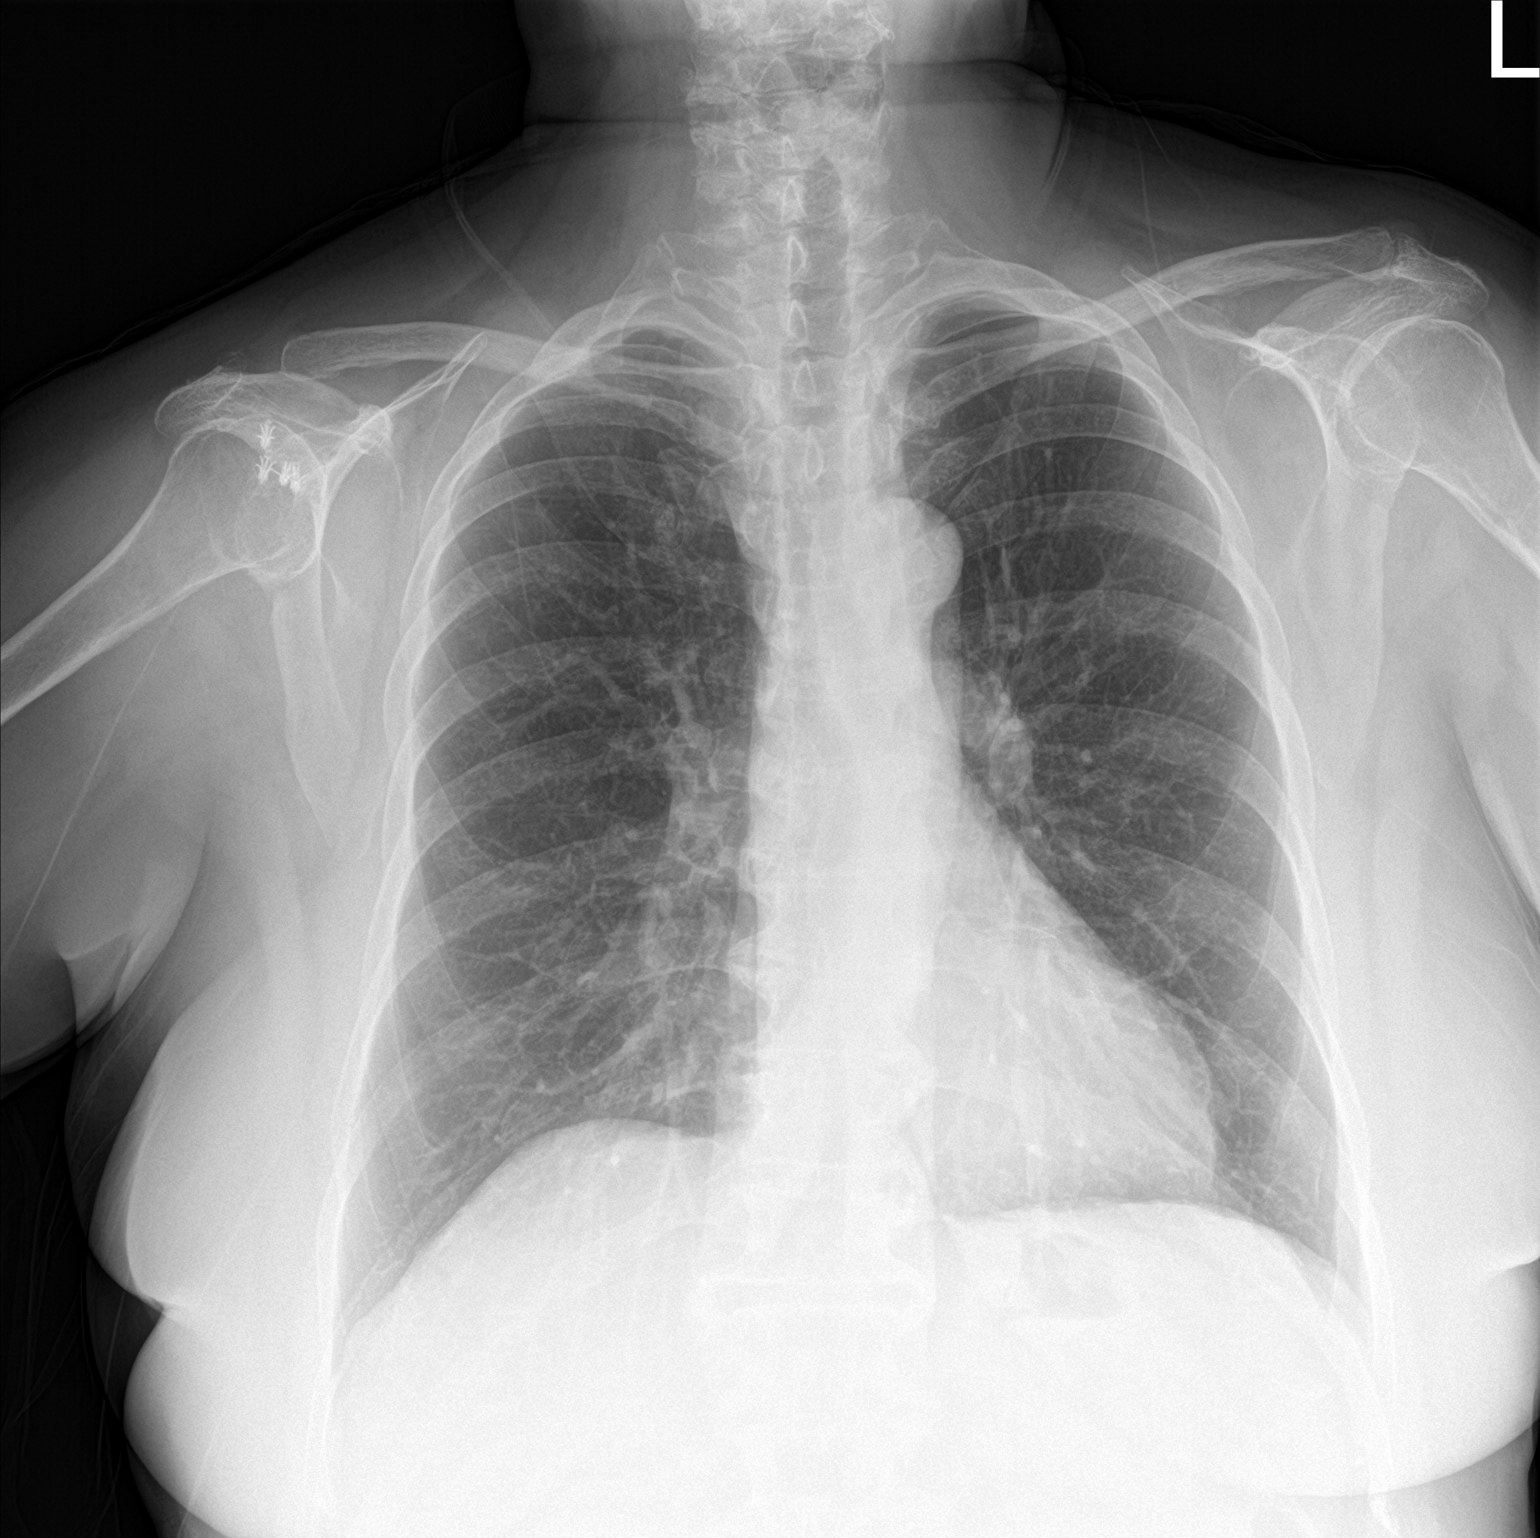

[chest lat]
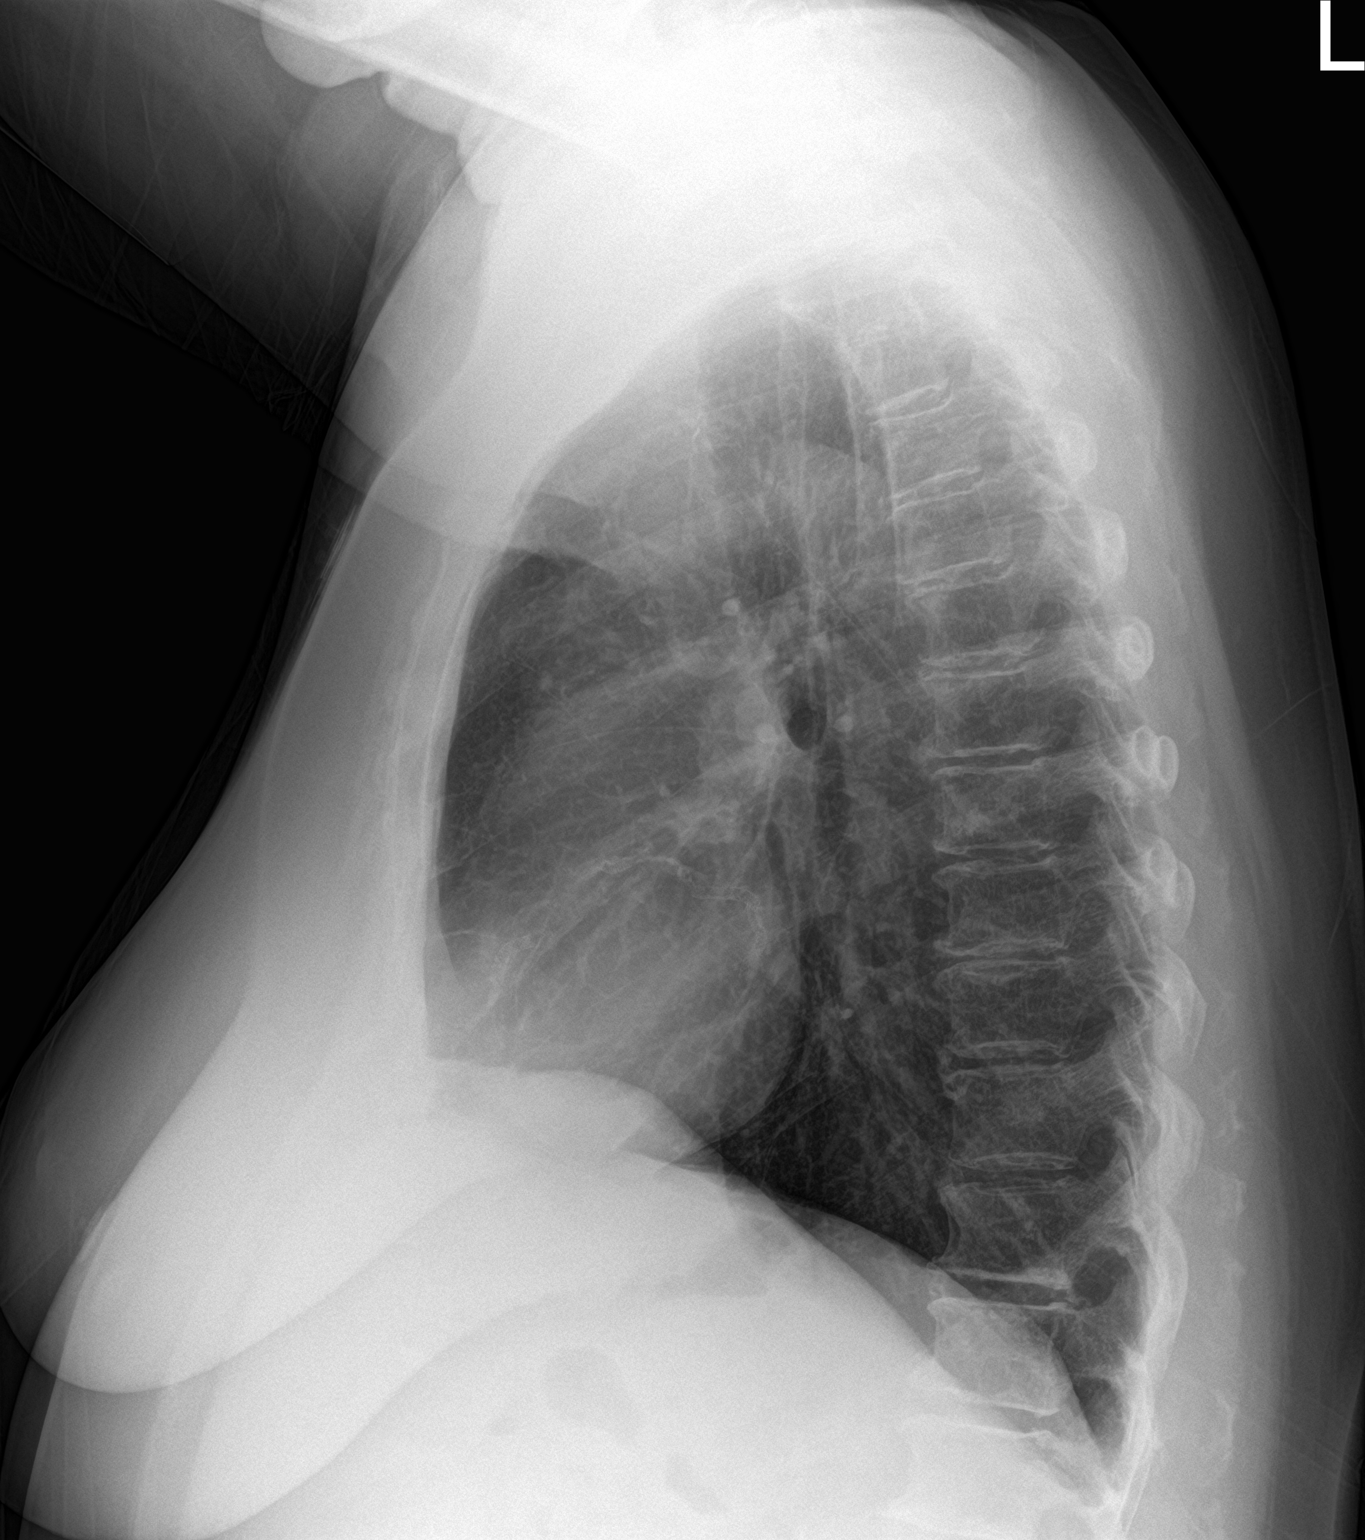

[2 of 2 positions shown; findings below may reference images not displayed]

FINDINGS: The heart size and mediastinal contours are within normal limits.
Both lungs are clear with mild hyperexpansion. There is mild
osteopenia with thoracic spondylosis and degenerative disc disease.
Chronic postsurgical changes right shoulder.
IMPRESSION: No active cardiopulmonary disease or interval changes. Stable
hyperinflated chest.

## 2023-03-30 ENCOUNTER — Other Ambulatory Visit (HOSPITAL_BASED_OUTPATIENT_CLINIC_OR_DEPARTMENT_OTHER): Payer: Self-pay | Admitting: Nurse Practitioner

## 2023-03-30 ENCOUNTER — Other Ambulatory Visit: Payer: Self-pay | Admitting: Interventional Cardiology

## 2023-03-30 DIAGNOSIS — G6289 Other specified polyneuropathies: Secondary | ICD-10-CM

## 2023-04-01 ENCOUNTER — Encounter (HOSPITAL_BASED_OUTPATIENT_CLINIC_OR_DEPARTMENT_OTHER): Payer: Self-pay | Admitting: Family Medicine

## 2023-04-03 ENCOUNTER — Other Ambulatory Visit (HOSPITAL_BASED_OUTPATIENT_CLINIC_OR_DEPARTMENT_OTHER): Payer: Self-pay

## 2023-04-03 DIAGNOSIS — G6289 Other specified polyneuropathies: Secondary | ICD-10-CM

## 2023-04-03 MED ORDER — GABAPENTIN 100 MG PO CAPS: 100.0000 mg | ORAL_CAPSULE | Freq: Every day | ORAL | 0 refills | Status: DC

## 2023-04-12 ENCOUNTER — Encounter (HOSPITAL_BASED_OUTPATIENT_CLINIC_OR_DEPARTMENT_OTHER): Payer: Self-pay | Admitting: Family Medicine

## 2023-05-05 ENCOUNTER — Other Ambulatory Visit: Payer: Self-pay | Admitting: Interventional Cardiology

## 2023-05-23 ENCOUNTER — Ambulatory Visit (HOSPITAL_BASED_OUTPATIENT_CLINIC_OR_DEPARTMENT_OTHER): Payer: Medicare PPO | Admitting: Family Medicine

## 2023-05-23 ENCOUNTER — Encounter (HOSPITAL_BASED_OUTPATIENT_CLINIC_OR_DEPARTMENT_OTHER): Payer: Self-pay | Admitting: Family Medicine

## 2023-05-23 VITALS — BP 121/83 | HR 84 | Ht 62.5 in | Wt 174.3 lb

## 2023-05-23 DIAGNOSIS — R7989 Other specified abnormal findings of blood chemistry: Secondary | ICD-10-CM

## 2023-05-23 DIAGNOSIS — E1159 Type 2 diabetes mellitus with other circulatory complications: Secondary | ICD-10-CM | POA: Diagnosis not present

## 2023-05-23 DIAGNOSIS — Z1211 Encounter for screening for malignant neoplasm of colon: Secondary | ICD-10-CM

## 2023-05-23 NOTE — Assessment & Plan Note (Signed)
Patient reports that she has been having slight increase in fatigue, increased appetite.  No significant changes in bowel habits, however reports that she does have occasional diarrhea which is typically occurring when she does not have adequate fiber intake.  She has had prior colonoscopy, however this was in 2008, no recent colon cancer screening since then.  She has not had any significant weight changes.  No associated abdominal pain. On review of chart, she has had prior mild elevations in TSH, this appears it was not followed up on in the past.  Given current symptoms, reasonable to proceed with laboratory assessment today for reevaluation Also recommend further evaluation with gastroenterologist to determine need for additional testing with colonoscopy given intermittent diarrhea

## 2023-05-23 NOTE — Progress Notes (Signed)
    Procedures performed today:    None.  Independent interpretation of notes and tests performed by another provider:   None.  Brief History, Exam, Impression, and Recommendations:    BP 121/83 (BP Location: Left Arm, Patient Position: Sitting, Cuff Size: Normal)   Pulse 84   Ht 5' 2.5" (1.588 m)   Wt 174 lb 4.8 oz (79.1 kg)   SpO2 97%   BMI 31.37 kg/m   Type 2 diabetes mellitus with other circulatory complication, without long-term current use of insulin (HCC) Assessment & Plan: Patient reports that she has been doing well, continues with medications as prescribed.  She indicates that she has a home A1c test that she will utilize monthly to keep an eye on how her blood sugars are doing and reports that these have been stable.  Last hemoglobin A1c earlier this year was at goal at 6.5%.  No issues with polyuria or polydipsia.  She has been having some slight increased fatigue, increased appetite We will check hemoglobin A1c today for monitoring Complete foot exam at next office visit  Orders: -     Hemoglobin A1c  Elevated TSH Assessment & Plan: Patient reports that she has been having slight increase in fatigue, increased appetite.  No significant changes in bowel habits, however reports that she does have occasional diarrhea which is typically occurring when she does not have adequate fiber intake.  She has had prior colonoscopy, however this was in 2008, no recent colon cancer screening since then.  She has not had any significant weight changes.  No associated abdominal pain. On review of chart, she has had prior mild elevations in TSH, this appears it was not followed up on in the past.  Given current symptoms, reasonable to proceed with laboratory assessment today for reevaluation Also recommend further evaluation with gastroenterologist to determine need for additional testing with colonoscopy given intermittent diarrhea  Orders: -     TSH + free T4 -     T3  Colon  cancer screening -     Ambulatory referral to Gastroenterology  Return in about 4 months (around 09/23/2023) for diabetes.   ___________________________________________ Elizebeth Kluesner de [place], MD, ABFM, CAQSM Primary Care and Sports Medicine [facility]

## 2023-05-23 NOTE — Assessment & Plan Note (Signed)
Patient reports that she has been doing well, continues with medications as prescribed.  She indicates that she has a home A1c test that she will utilize monthly to keep an eye on how her blood sugars are doing and reports that these have been stable.  Last hemoglobin A1c earlier this year was at goal at 6.5%.  No issues with polyuria or polydipsia.  She has been having some slight increased fatigue, increased appetite We will check hemoglobin A1c today for monitoring Complete foot exam at next office visit

## 2023-05-23 NOTE — Patient Instructions (Addendum)
  Medication Instructions:  Your physician recommends that you continue on your current medications as directed. Please refer to the Current Medication list given to you today. --If you need a refill on any your medications before your next appointment, please call your pharmacy first. If no refills are authorized on file call the office.-- Lab Work: Your physician has recommended that you have lab work today: Yes If you have labs (blood work) drawn today and your tests are completely normal, you will receive your results via MyChart message OR a phone call from our staff.  Please ensure you check your voicemail in the event that you authorized detailed messages to be left on a delegated number. If you have any lab test that is abnormal or we need to change your treatment, we will call you to review the results.  Referrals/Procedures/Imaging: Yes, Gastroenterology  Follow-Up: Your next appointment:   Your physician recommends that you schedule a follow-up appointment in: 3-4 months with Dr. de Peru.  You will receive a text message or e-mail with a link to a survey about your care and experience with [place] today! We would greatly appreciate your feedback!   Thanks for letting us be apart of your health journey!!  Primary Care and Sports Medicine   Dr. Ceasar Mons Peru   We encourage you to activate your patient portal called "MyChart".  Sign up information is provided on this After Visit Summary.  MyChart is used to connect with patients for Virtual Visits (Telemedicine).  Patients are able to view lab/test results, encounter notes, upcoming appointments, etc.  Non-urgent messages can be sent to your provider as well. To learn more about what you can do with MyChart, please visit --  [email or web address].

## 2023-05-24 LAB — T3: T3, Total: 127 ng/dL (ref 71–180)

## 2023-05-24 LAB — TSH+FREE T4
Free T4: 1.28 ng/dL (ref 0.82–1.77)
TSH: 4.98 u[IU]/mL — ABNORMAL HIGH (ref 0.450–4.500)

## 2023-05-24 LAB — HEMOGLOBIN A1C
Est. average glucose Bld gHb Est-mCnc: 148 mg/dL
Hgb A1c MFr Bld: 6.8 % — ABNORMAL HIGH (ref 4.8–5.6)

## 2023-07-03 ENCOUNTER — Other Ambulatory Visit (HOSPITAL_BASED_OUTPATIENT_CLINIC_OR_DEPARTMENT_OTHER): Payer: Self-pay | Admitting: Family Medicine

## 2023-07-03 DIAGNOSIS — G6289 Other specified polyneuropathies: Secondary | ICD-10-CM

## 2023-07-10 ENCOUNTER — Encounter: Payer: Self-pay | Admitting: Interventional Cardiology

## 2023-07-26 ENCOUNTER — Other Ambulatory Visit (HOSPITAL_BASED_OUTPATIENT_CLINIC_OR_DEPARTMENT_OTHER): Payer: Self-pay | Admitting: *Deleted

## 2023-07-26 DIAGNOSIS — E1159 Type 2 diabetes mellitus with other circulatory complications: Secondary | ICD-10-CM

## 2023-07-26 MED ORDER — JANUMET XR 50-1000 MG PO TB24
2.0000 | ORAL_TABLET | Freq: Every morning | ORAL | 1 refills | Status: DC
Start: 2023-07-26 — End: 2024-01-23

## 2023-08-03 ENCOUNTER — Other Ambulatory Visit: Payer: Self-pay | Admitting: Interventional Cardiology

## 2023-08-07 NOTE — Progress Notes (Unsigned)
**Note Suzanne-Identified via Obfuscation** Cardiology Office Note:  .   Date:  08/08/2023  ID:  Suzanne Watkins, DOB 1951-04-07, MRN [id number] PCP: Suzanne Peru, Raymond J, MD  [facility] Providers Cardiologist:  Lance Muss, MD {   History of Present Illness: .   Suzanne Watkins is a 72 y.o. female with a past medical history of CAD with stents in all 3 coronary vessels (first in 2009 and most recent 08/2013) here for follow-up appointment.  Patient used to walk a lot in the [place] campus.  Retired in January 2019 and joined a senior center to exercise on the treadmill.  Broke her leg 08/2018.  Had surgery December of that year.  She did PT.  No cardiac issues at that time.  Some shortness of breath in January 2021.  Has some intermittent tachycardia and checks her pulse ox.  CT scan was performed showing no PE, mild emphysema.  October 2021 she had an episode of left arm pain with shortness of breath.  She used SL NTG and had some relief.  1 episode of chest tightness again and used it again.  2021, Imdur was added and amlodipine was increased to 5 mg twice daily with blood pressure improved.  Cardiac cath 2021 resulted in PTCA of OM, unable to advance stent due to tortuosity from the radial approach.  Cath 12/2020 showed:  Non-stenotic Mid LAD lesion was previously treated. Proximal OM1 stent was patent. Mid OM1 lesion is 90% stenosed. Tortuous vessel and area of severe disease. A drug-eluting stent was successfully placed using a STENT RESOLUTE ONYX 2.25X34, postdilated to > 2.5 mm. Post intervention, there is a 0% residual stenosis. RCA stents are patent. 2nd RPL lesion is 80% stenosed. RPDA lesion is 50% stenosed. The left ventricular systolic function is normal. LV end diastolic pressure is normal. The left ventricular ejection fraction is 55-65% by visual estimate. There is no aortic valve stenosis. Femoral approach allowed for much more support than prior radial attempt.    This was done due to recurrent  angina.  Recommended continuing aggressive secondary prevention.  Dual antiplatelet therapy for 6 months.  Watch overnight plan to get home in the morning.  Had more exertional angina.  Changed Imdur to 60 mg twice daily.  She felt she had some side effects and instead would take 90 in the a.m. and 30 in the p.m.  In December, she had a visit with Dr. Baldwin [place] regarding drug-coated coronary balloon.  Declined the DCB study due to the 1 in 3 chance of receiving standard balloon.  2023 went [employer] in [place] and was able to navigate walking the park and managing her angina.  Averaging 17,000 steps a day.  As of 2023, she was awaiting drug-coated balloon availability.  Symptoms have been stable.  Angina after 5 minutes walking.  She has to rest at times.  Hip pain also limits her activity.  Today, she tells me that her A1c in July through her primary was 6.8.  She has been trying to watch her sugars closely.  She has some chest tightness after only walking for a few minutes at a time.  This is nothing new for her.  Unfortunately, she has had a complicated history and there has been several cardiac catheterizations to try and fix the problem.  She states that the plan was to intervene with a drug-coated balloon once available she thinks the drug is called Paclitaxel.  She wants to decrease her Imdur to 60 mg daily due  to her blood pressure dropping at times.  She is able to average about 01-4999 steps a day intermittently.  She has added some supplements on her own including bovine powder for her thyroid and a mushroom supplement that has [place] main and it for her fibromyalgia.  I will send a message to pharmacy to ensure the safety of the supplements  Reports no shortness of breath nor dyspnea on exertion. Reports no chest pain, pressure, or tightness. No edema, orthopnea, PND. Reports no palpitations.     ROS: Pertinent ROS in HPI  Studies Reviewed: Marland Kitchen        Left Heart Cath  07/20/21  Left Ventricle The left ventricular size is normal. The left ventricular systolic function is normal. LV end diastolic pressure is normal. The left ventricular ejection fraction is 55-65% by visual estimate. No regional wall motion abnormalities.  Aortic Valve There is no aortic valve stenosis.   Coronary Diagrams  Diagnostic Dominance: Right  Intervention         Physical Exam:   VS:  BP 124/72   Pulse 65   Ht 5' 2.5" (1.588 m)   Wt 172 lb 3.2 oz (78.1 kg)   SpO2 97%   BMI 30.99 kg/m    Wt Readings from Last 3 Encounters:  08/08/23 172 lb 3.2 oz (78.1 kg)  05/23/23 174 lb 4.8 oz (79.1 kg)  02/07/23 173 lb 6.4 oz (78.7 kg)    GEN: Well nourished, well developed in no acute distress NECK: No JVD; No carotid bruits CARDIAC: RRR, no murmurs, rubs, gallops RESPIRATORY:  Clear to auscultation without rales, wheezing or rhonchi  ABDOMEN: Soft, non-tender, non-distended EXTREMITIES:  No edema; No deformity   ASSESSMENT AND PLAN: .   1.  CAD/old MI -Still having some chest tightness with a few minutes of activity -Recently her Imdur was increased to 90 and she does not feel like there is much difference in her blood pressure is getting low at times so we have changed back to 60 mg daily -She is to continue her normal medications: Amlodipine 5 mg daily, aspirin 81 mg daily, Lipitor 80 mg daily, Zetia 10 mg daily, metoprolol succinate 25 mg daily, nitro as needed, Brilinta 90 mg twice a day  2.  Hyperlipidemia -We will recheck a lipid panel and LFTs today -Last LDL was 58, at goal -Would continue current medication regimen  3.  Diabetes -Will check hemoglobin A1c today -Goal is under 6.0  4.  Hypertensive heart disease -Blood pressure well-controlled today 124/72 -We have asked her to keep track at home -Continue low-sodium, heart healthy diet      Dispo: She can follow-up with Dr. [place] at his next availability for establishing care  Signed, Sharlene Dory,  PA-C

## 2023-08-08 ENCOUNTER — Ambulatory Visit: Payer: Medicare PPO | Attending: Physician Assistant | Admitting: Physician Assistant

## 2023-08-08 ENCOUNTER — Encounter: Payer: Self-pay | Admitting: Physician Assistant

## 2023-08-08 VITALS — BP 124/72 | HR 65 | Ht 62.5 in | Wt 172.2 lb

## 2023-08-08 DIAGNOSIS — I2510 Atherosclerotic heart disease of native coronary artery without angina pectoris: Secondary | ICD-10-CM

## 2023-08-08 DIAGNOSIS — E785 Hyperlipidemia, unspecified: Secondary | ICD-10-CM

## 2023-08-08 DIAGNOSIS — I10 Essential (primary) hypertension: Secondary | ICD-10-CM | POA: Diagnosis not present

## 2023-08-08 DIAGNOSIS — E1159 Type 2 diabetes mellitus with other circulatory complications: Secondary | ICD-10-CM

## 2023-08-08 NOTE — Patient Instructions (Signed)
Medication Instructions:  Your physician recommends that you continue on your current medications as directed. Please refer to the Current Medication list given to you today.  *If you need a refill on your cardiac medications before your next appointment, please call your pharmacy*  Lab Work: LIPIDS, LFTS, A1C-TODAY If you have labs (blood work) drawn today and your tests are completely normal, you will receive your results only by: MyChart Message (if you have MyChart) OR A paper copy in the mail If you have any lab test that is abnormal or we need to change your treatment, we will call you to review the results.  Follow-Up: At [facility], you and your health needs are our priority.  As part of our continuing mission to provide you with exceptional heart care, we have created designated Provider Care Teams.  These Care Teams include your primary Cardiologist (physician) and Advanced Practice Providers (APPs -  Physician Assistants and Nurse Practitioners) who all work together to provide you with the care you need, when you need it.  Your next appointment:   Next available  Provider:   Dr Swaziland  Other Instructions Check your blood pressure daily, 1 hr after morning medications for 2 weeks, keep a log and send [place] the readings through mychart at the end of the 2 weeks.   Heart-Healthy Eating Plan Many factors influence your heart health, including eating and exercise habits. Heart health is also called coronary health. Coronary risk increases with abnormal blood fat (lipid) levels. A heart-healthy eating plan includes limiting unhealthy fats, increasing healthy fats, limiting salt (sodium) intake, and making other diet and lifestyle changes. What is my plan? Your health care provider may recommend that: You limit your fat intake to _________% or less of your total calories each day. You limit your saturated fat intake to _________% or less of your total calories each day. You  limit the amount of cholesterol in your diet to less than _________ mg per day. You limit the amount of sodium in your diet to less than _________ mg per day. What are tips for following this plan? Cooking Cook foods using methods other than frying. Baking, boiling, grilling, and broiling are all good options. Other ways to reduce fat include: Removing the skin from poultry. Removing all visible fats from meats. Steaming vegetables in water or broth. Meal planning  At meals, imagine dividing your plate into fourths: Fill one-half of your plate with vegetables and green salads. Fill one-fourth of your plate with whole grains. Fill one-fourth of your plate with lean protein foods. Eat 2-4 cups of vegetables per day. One cup of vegetables equals 1 cup (91 g) broccoli or cauliflower florets, 2 medium carrots, 1 large bell pepper, 1 large sweet potato, 1 large tomato, 1 medium white potato, 2 cups (150 g) raw leafy greens. Eat 1-2 cups of fruit per day. One cup of fruit equals 1 small apple, 1 large banana, 1 cup (237 g) mixed fruit, 1 large orange,  cup (82 g) dried fruit, 1 cup (240 mL) 100% fruit juice. Eat more foods that contain soluble fiber. Examples include apples, broccoli, carrots, beans, peas, and barley. Aim to get 25-30 g of fiber per day. Increase your consumption of legumes, nuts, and seeds to 4-5 servings per week. One serving of dried beans or legumes equals  cup (90 g) cooked, 1 serving of nuts is  oz (12 almonds, 24 pistachios, or 7 walnut halves), and 1 serving of seeds equals  oz (  8 g). Fats Choose healthy fats more often. Choose monounsaturated and polyunsaturated fats, such as olive and canola oils, avocado oil, flaxseeds, walnuts, almonds, and seeds. Eat more omega-3 fats. Choose salmon, mackerel, sardines, tuna, flaxseed oil, and ground flaxseeds. Aim to eat fish at least 2 times each week. Check food labels carefully to identify foods with trans fats or high amounts  of saturated fat. Limit saturated fats. These are found in animal products, such as meats, butter, and cream. Plant sources of saturated fats include palm oil, palm kernel oil, and coconut oil. Avoid foods with partially hydrogenated oils in them. These contain trans fats. Examples are stick margarine, some tub margarines, cookies, crackers, and other baked goods. Avoid fried foods. General information Eat more home-cooked food and less restaurant, buffet, and fast food. Limit or avoid alcohol. Limit foods that are high in added sugar and simple starches such as foods made using white refined flour (white breads, pastries, sweets). Lose weight if you are overweight. Losing just 5-10% of your body weight can help your overall health and prevent diseases such as diabetes and heart disease. Monitor your sodium intake, especially if you have high blood pressure. Talk with your health care provider about your sodium intake. Try to incorporate more vegetarian meals weekly. What foods should I eat? Fruits All fresh, canned (in natural juice), or frozen fruits. Vegetables Fresh or frozen vegetables (raw, steamed, roasted, or grilled). Green salads. Grains Most grains. Choose whole wheat and whole grains most of the time. Rice and pasta, including brown rice and pastas made with whole wheat. Meats and other proteins Lean, well-trimmed beef, veal, pork, and lamb. Chicken and [place] without skin. All fish and shellfish. Wild duck, rabbit, pheasant, and venison. Egg whites or low-cholesterol egg substitutes. Dried beans, peas, lentils, and tofu. Seeds and most nuts. Dairy Low-fat or nonfat cheeses, including ricotta and mozzarella. Skim or 1% milk (liquid, powdered, or evaporated). Buttermilk made with low-fat milk. Nonfat or low-fat yogurt. Fats and oils Non-hydrogenated (trans-free) margarines. Vegetable oils, including soybean, sesame, sunflower, olive, avocado, peanut, safflower, corn, canola, and  cottonseed. Salad dressings or mayonnaise made with a vegetable oil. Beverages Water (mineral or sparkling). Coffee and tea. Unsweetened ice tea. Diet beverages. Sweets and desserts Sherbet, gelatin, and fruit ice. Small amounts of dark chocolate. Limit all sweets and desserts. Seasonings and condiments All seasonings and condiments. The items listed above may not be a complete list of foods and beverages you can eat. Contact a dietitian for more options. What foods should I avoid? Fruits Canned fruit in heavy syrup. Fruit in cream or butter sauce. Fried fruit. Limit coconut. Vegetables Vegetables cooked in cheese, cream, or butter sauce. Fried vegetables. Grains Breads made with saturated or trans fats, oils, or whole milk. Croissants. Sweet rolls. Donuts. High-fat crackers, such as cheese crackers and chips. Meats and other proteins Fatty meats, such as hot dogs, ribs, sausage, bacon, rib-eye roast or steak. High-fat deli meats, such as salami and bologna. Caviar. Domestic duck and goose. Organ meats, such as liver. Dairy Cream, sour cream, cream cheese, and creamed cottage cheese. Whole-milk cheeses. Whole or 2% milk (liquid, evaporated, or condensed). Whole buttermilk. Cream sauce or high-fat cheese sauce. Whole-milk yogurt. Fats and oils Meat fat, or shortening. Cocoa butter, hydrogenated oils, palm oil, coconut oil, palm kernel oil. Solid fats and shortenings, including bacon fat, salt pork, lard, and butter. Nondairy cream substitutes. Salad dressings with cheese or sour cream. Beverages Regular sodas and any drinks with added sugar.  Sweets and desserts Frosting. Pudding. Cookies. Cakes. Pies. Milk chocolate or white chocolate. Buttered syrups. Full-fat ice cream or ice cream drinks. The items listed above may not be a complete list of foods and beverages to avoid. Contact a dietitian for more information. Summary Heart-healthy meal planning includes limiting unhealthy fats,  increasing healthy fats, limiting salt (sodium) intake and making other diet and lifestyle changes. Lose weight if you are overweight. Losing just 5-10% of your body weight can help your overall health and prevent diseases such as diabetes and heart disease. Focus on eating a balance of foods, including fruits and vegetables, low-fat or nonfat dairy, lean protein, nuts and legumes, whole grains, and heart-healthy oils and fats. This information is not intended to replace advice given to you by your health care provider. Make sure you discuss any questions you have with your health care provider. Document Revised: 11/22/2021 Document Reviewed: 11/22/2021 Elsevier Patient Education  2024 Else[employer] Inc. Low-Sodium Eating Plan Salt (sodium) helps you keep a healthy balance of fluids in your body. Too much sodium can raise your blood pressure. It can also cause fluid and waste to be held in your body. Your health care provider or dietitian may recommend a low-sodium eating plan if you have high blood pressure (hypertension), kidney disease, liver disease, or heart failure. Eating less sodium can help lower your blood pressure and reduce swelling. It can also protect your heart, liver, and kidneys. What are tips for following this plan? Reading food labels  Check food labels for the amount of sodium per serving. If you eat more than one serving, you must multiply the listed amount by the number of servings. Choose foods with less than 140 milligrams (mg) of sodium per serving. Avoid foods with 300 mg of sodium or more per serving. Always check how much sodium is in a product, even if the label says "unsalted" or "no salt added." Shopping  Buy products labeled as "low-sodium" or "no salt added." Buy fresh foods. Avoid canned foods and pre-made or frozen meals. Avoid canned, cured, or processed meats. Buy breads that have less than 80 mg of sodium per slice. Cooking  Eat more home-cooked food. Try  to eat less restaurant, buffet, and fast food. Try not to add salt when you cook. Use salt-free seasonings or herbs instead of table salt or sea salt. Check with your provider or pharmacist before using salt substitutes. Cook with plant-based oils, such as canola, sunflower, or olive oil. Meal planning When eating at a restaurant, ask if your food can be made with less salt or no salt. Avoid dishes labeled as brined, pickled, cured, or smoked. Avoid dishes made with soy sauce, miso, or teriyaki sauce. Avoid foods that have monosodium glutamate (MSG) in them. MSG may be added to some restaurant food, sauces, soups, bouillon, and canned foods. Make meals that can be grilled, baked, poached, roasted, or steamed. These are often made with less sodium. General information Try to limit your sodium intake to 1,500-2,300 mg each day, or the amount told by your provider. What foods should I eat? Fruits Fresh, frozen, or canned fruit. Fruit juice. Vegetables Fresh or frozen vegetables. "No salt added" canned vegetables. "No salt added" tomato sauce and paste. Low-sodium or reduced-sodium tomato and vegetable juice. Grains Low-sodium cereals, such as oats, puffed wheat and rice, and shredded wheat. Low-sodium crackers. Unsalted rice. Unsalted pasta. Low-sodium bread. Whole grain breads and whole grain pasta. Meats and other proteins Fresh or frozen meat, poultry, seafood,  and fish. These should have no added salt. Low-sodium canned tuna and salmon. Unsalted nuts. Dried peas, beans, and lentils without added salt. Unsalted canned beans. Eggs. Unsalted nut butters. Dairy Milk. Soy milk. Cheese that is naturally low in sodium, such as ricotta cheese, fresh mozzarella, or Swiss cheese. Low-sodium or reduced-sodium cheese. Cream cheese. Yogurt. Seasonings and condiments Fresh and dried herbs and spices. Salt-free seasonings. Low-sodium mustard and ketchup. Sodium-free salad dressing. Sodium-free light  mayonnaise. Fresh or refrigerated horseradish. Lemon juice. Vinegar. Other foods Homemade, reduced-sodium, or low-sodium soups. Unsalted popcorn and pretzels. Low-salt or salt-free chips. The items listed above may not be all the foods and drinks you can have. Talk to a dietitian to learn more. What foods should I avoid? Vegetables Sauerkraut, pickled vegetables, and relishes. Olives. [place] fries. Onion rings. Regular canned vegetables, except low-sodium or reduced-sodium items. Regular canned tomato sauce and paste. Regular tomato and vegetable juice. Frozen vegetables in sauces. Grains Instant hot cereals. Bread stuffing, pancake, and biscuit mixes. Croutons. Seasoned rice or pasta mixes. Noodle soup cups. Boxed or frozen macaroni and cheese. Regular salted crackers. Self-rising flour. Meats and other proteins Meat or fish that is salted, canned, smoked, spiced, or pickled. Precooked or cured meat, such as sausages or meat loaves. Tomasa Blase. Ham. Pepperoni. Hot dogs. Corned beef. Chipped beef. Salt pork. Jerky. Pickled herring, anchovies, and sardines. Regular canned tuna. Salted nuts. Dairy Processed cheese and cheese spreads. Hard cheeses. Cheese curds. Blue cheese. Feta cheese. String cheese. Regular cottage cheese. Buttermilk. Canned milk. Fats and oils Salted butter. Regular margarine. Ghee. Bacon fat. Seasonings and condiments Onion salt, garlic salt, seasoned salt, table salt, and sea salt. Canned and packaged gravies. Worcestershire sauce. Tartar sauce. Barbecue sauce. Teriyaki sauce. Soy sauce, including reduced-sodium soy sauce. Steak sauce. Fish sauce. Oyster sauce. Cocktail sauce. Horseradish that you find on the shelf. Regular ketchup and mustard. Meat flavorings and tenderizers. Bouillon cubes. Hot sauce. Pre-made or packaged marinades. Pre-made or packaged taco seasonings. Relishes. Regular salad dressings. Salsa. Other foods Salted popcorn and pretzels. Corn chips and puffs. Potato  and tortilla chips. Canned or dried soups. Pizza. Frozen entrees and pot pies. The items listed above may not be all the foods and drinks you should avoid. Talk to a dietitian to learn more. This information is not intended to replace advice given to you by your health care provider. Make sure you discuss any questions you have with your health care provider. Document Revised: 11/03/2022 Document Reviewed: 11/03/2022 Elsevier Patient Education  2024 [employer].

## 2023-08-09 LAB — LIPID PANEL
Cholesterol, Total: 109 mg/dL (ref 100–199)
HDL: 34 mg/dL — ABNORMAL LOW (ref >39)
LDL CALC COMMENT:: 3.2 ratio (ref 0.0–4.4)
LDL Chol Calc (NIH): 49 mg/dL (ref 0–99)
Triglycerides: 152 mg/dL — ABNORMAL HIGH (ref 0–149)
VLDL Cholesterol Cal: 26 mg/dL (ref 5–40)

## 2023-08-09 LAB — HEMOGLOBIN A1C
Est. average glucose Bld gHb Est-mCnc: 131 mg/dL
Hgb A1c MFr Bld: 6.2 % — ABNORMAL HIGH (ref 4.8–5.6)

## 2023-08-09 LAB — HEPATIC FUNCTION PANEL
ALT: 49 IU/L — ABNORMAL HIGH (ref 0–32)
AST: 53 IU/L — ABNORMAL HIGH (ref 0–40)
Albumin: 4.4 g/dL (ref 3.9–4.9)
Alkaline Phosphatase: 79 IU/L (ref 44–121)
Bilirubin Total: 0.4 mg/dL (ref 0.0–1.2)
Bilirubin, Direct: 0.13 mg/dL (ref 0.00–0.40)
Total Protein: 6.6 g/dL (ref 6.0–8.5)

## 2023-08-11 ENCOUNTER — Telehealth: Payer: Self-pay | Admitting: *Deleted

## 2023-08-11 DIAGNOSIS — Z79899 Other long term (current) drug therapy: Secondary | ICD-10-CM

## 2023-08-11 NOTE — Telephone Encounter (Signed)
-----   Message from Sharlene Dory sent at 08/11/2023  1:08 PM EDT ----- Ms. Suzanne Watkins,   Your LDL is 49, at goal.  HDL 34 which is a little low.  Triglycerides 152 which is a tad high.  Overall, would not make any medication changes at the moment.  Liver function was slightly elevated.  It has been elevated in the past.  Lets plan to recheck this in a week.  Triage, please order LFTs in a week.  Sharlene Dory, PA-C

## 2023-08-18 ENCOUNTER — Ambulatory Visit: Payer: Medicare PPO

## 2023-08-21 ENCOUNTER — Encounter (HOSPITAL_BASED_OUTPATIENT_CLINIC_OR_DEPARTMENT_OTHER): Payer: Self-pay | Admitting: *Deleted

## 2023-08-21 ENCOUNTER — Telehealth (HOSPITAL_BASED_OUTPATIENT_CLINIC_OR_DEPARTMENT_OTHER): Payer: Self-pay | Admitting: *Deleted

## 2023-08-21 NOTE — Telephone Encounter (Signed)
LVM to schedule awv for pt

## 2023-08-23 ENCOUNTER — Ambulatory Visit: Payer: Medicare PPO | Attending: Physician Assistant

## 2023-08-23 DIAGNOSIS — Z79899 Other long term (current) drug therapy: Secondary | ICD-10-CM

## 2023-08-24 ENCOUNTER — Encounter: Payer: Self-pay | Admitting: Physician Assistant

## 2023-08-24 LAB — HEPATIC FUNCTION PANEL
ALT: 40 [IU]/L — ABNORMAL HIGH (ref 0–32)
AST: 39 [IU]/L (ref 0–40)
Albumin: 4.4 g/dL (ref 3.9–4.9)
Alkaline Phosphatase: 78 [IU]/L (ref 44–121)
Bilirubin Total: 0.3 mg/dL (ref 0.0–1.2)
Bilirubin, Direct: 0.14 mg/dL (ref 0.00–0.40)
Total Protein: 6.9 g/dL (ref 6.0–8.5)

## 2023-09-18 NOTE — Progress Notes (Unsigned)
**Note Suzanne-Identified via Obfuscation** Cardiology Office Note:    Date:  09/19/2023   ID:  [name], DOB 1951-07-09, MRN [id number]  PCP:  Suzanne Peru, Raymond Watkins, Watkins   [facility] Providers Cardiologist:  Lance Muss, Watkins     Referring Watkins: Suzanne Peru, Buren Kos, Watkins   Chief Complaint  Patient presents with   Coronary Artery Disease    History of Present Illness:    Suzanne Watkins is a 72 y.o. female seen for follow up CAD. She is a former patient of Dr Suzanne Watkins. She has a history of DM, HLD, HTN. She has had multiple stents in the past beginning in 2009 and last in Sept 2022. At that time she had Scoring balloon angioplasty for in stent restenosis of the OM. Noted severe calcification and noted that if stenosis recurred would consider Shockwave therapy. There was also some discussion about drug coated balloon when available.   She reports her cardiac situation is stable. She can tell when her Imdur is wearing out. She tried to cut back to 60 mg but ended up going back to 90. Uses sl Ntg occasionally. No real increase in chronic angina. No dyspnea. Has lost some weight. Last A1c 6.2%.   Past Medical History:  Diagnosis Date   Anginal pain (HCC)    Arthritis    "probably in my hands" (09/04/2013)   Basal cell carcinoma of nostril 05/2008   Cancer (HCC)    Phreesia 05/17/2020   Cervical cancer (HCC) 1986   Coronary atherosclerosis of native coronary artery 09/04/2013   RCA and obtuse marginal stent/DES-2010   Diabetes mellitus without complication (HCC)    Phreesia 05/17/2020   Displaced fracture of distal end of right fibula 10/10/2018   Fibromyalgia    "dx'd in 1994"   Full dentures    GERD (gastroesophageal reflux disease)    Hyperlipidemia    Hypertension    Myocardial infarction (HCC)    Phreesia 05/17/2020   Obesity    Shortness of breath    "just related to angina I was having recently" (09/04/2013)   Type II diabetes mellitus (HCC)    followed by pcp   Unstable angina (HCC) 09/04/2013    Wears glasses     Past Surgical History:  Procedure Laterality Date   CARPAL TUNNEL RELEASE Right 07/1992   CERVICAL CONE BIOPSY  03/1985   CORONARY ANGIOPLASTY WITH STENT PLACEMENT  07/2009, 08/2009; 09/04/2013   "1+1+2; total of 4" (09/04/2013)   CORONARY BALLOON ANGIOPLASTY N/A 10/06/2020   Procedure: CORONARY BALLOON ANGIOPLASTY;  Surgeon: Suzanne Watkins;  Location: [facility] INVASIVE CV LAB;  Service: Cardiovascular;  Laterality: N/A;   CORONARY BALLOON ANGIOPLASTY N/A 07/30/2021   Procedure: CORONARY BALLOON ANGIOPLASTY;  Surgeon: Suzanne Watkins;  Location: [facility] INVASIVE CV LAB;  Service: Cardiovascular;  Laterality: N/A;   CORONARY STENT INTERVENTION N/A 01/15/2021   Procedure: CORONARY STENT INTERVENTION;  Surgeon: Suzanne Watkins;  Location: [facility] INVASIVE CV LAB;  Service: Cardiovascular;  Laterality: N/A;   FRACTURE SURGERY N/A    Phreesia 05/17/2020   LEFT HEART CATH AND CORONARY ANGIOGRAPHY N/A 10/06/2020   Procedure: LEFT HEART CATH AND CORONARY ANGIOGRAPHY;  Surgeon: Suzanne Watkins;  Location: [facility] INVASIVE CV LAB;  Service: Cardiovascular;  Laterality: N/A;   LEFT HEART CATH AND CORONARY ANGIOGRAPHY N/A 01/15/2021   Procedure: LEFT HEART CATH AND CORONARY ANGIOGRAPHY;  Surgeon: Suzanne Watkins;  Location: [facility] INVASIVE CV LAB;  Service: Cardiovascular;  Laterality: N/A;  LEFT HEART CATH AND CORONARY ANGIOGRAPHY N/A 07/30/2021   Procedure: LEFT HEART CATH AND CORONARY ANGIOGRAPHY;  Surgeon: Suzanne Watkins;  Location: [facility] INVASIVE CV LAB;  Service: Cardiovascular;  Laterality: N/A;   LEFT HEART CATHETERIZATION WITH CORONARY ANGIOGRAM N/A 09/04/2013   Procedure: LEFT HEART CATHETERIZATION WITH CORONARY ANGIOGRAM;  Surgeon: Suzanne Watkins;  Location: [facility] CATH LAB;  Service: Cardiovascular;  Laterality: N/A;   MOHS SURGERY Left 05/2008   "nostril"   ORIF ANKLE FRACTURE Right 10/10/2018   Procedure: OPEN REDUCTION INTERNAL FIXATION (ORIF) ANKLE  FRACTURE;  Surgeon: Suzanne Watkins;  Location: [facility] ORS;  Service: Orthopedics;  Laterality: Right;   SHOULDER OPEN ROTATOR CUFF REPAIR Right 10/2004   "got 5 pins in" (09/04/2013)   TUBAL LIGATION Bilateral 1979    Current Medications: Current Meds  Medication Sig   amLODipine (NORVASC) 5 MG tablet TAKE 1 TABLET(5 MG) BY MOUTH IN THE MORNING AND AT BEDTIME   aspirin 81 MG chewable tablet Chew 1 tablet (81 mg total) by mouth daily.   atorvastatin (LIPITOR) 80 MG tablet TAKE 1 TABLET(80 MG) BY MOUTH DAILY AT 6 PM   Collagen Hydrolysate, Bovine, POWD by Does not apply route.   cyanocobalamin (VITAMIN B12) 250 MCG tablet Take 250 mcg by mouth daily.   diphenhydrAMINE (BENADRYL) 50 MG capsule Take 50 mg by mouth at bedtime.   ezetimibe (ZETIA) 10 MG tablet TAKE 1 TABLET(10 MG) BY MOUTH DAILY   gabapentin (NEURONTIN) 100 MG capsule TAKE 1 TO 3 CAPSULES(100 TO 300 MG) BY MOUTH AT BEDTIME   glucose blood (ACCU-CHEK GUIDE) test strip Use as instructed   isosorbide mononitrate (IMDUR) 60 MG 24 hr tablet TAKE 1 AND 1/2 TABLETS(90 MG) BY MOUTH DAILY   KRILL OIL PO Take by mouth.   metoprolol succinate (TOPROL-XL) 25 MG 24 hr tablet TAKE 1 TABLET(25 MG) BY MOUTH DAILY   Multiple Vitamin (MULTIVITAMIN WITH MINERALS) TABS tablet Take 1 tablet by mouth at bedtime. Centrum Silver for Women   nitroGLYCERIN (NITROSTAT) 0.4 MG SL tablet Place 1 tablet (0.4 mg total) under the tongue every 5 (five) minutes as needed for chest pain.   omeprazole (PRILOSEC) 20 MG capsule Take 20 mg by mouth daily before breakfast.    SitaGLIPtin-MetFORMIN HCl (JANUMET XR) 50-1000 MG TB24 Take 2 tablets by mouth every morning.   ticagrelor (BRILINTA) 60 MG TABS tablet Take 1 tablet (60 mg total) by mouth 2 (two) times daily.   [DISCONTINUED] BRILINTA 90 MG TABS tablet TAKE 1 TABLET(90 MG) BY MOUTH TWICE DAILY.     Allergies:   Plavix [clopidogrel bisulfate], Adhesive [tape], and Latex   Social History   Socioeconomic  History   Marital status: Married    Spouse name: Not on file   Number of children: Not on file   Years of education: Not on file   Highest education level: Not on file  Occupational History   Not on file  Tobacco Use   Smoking status: Former    Current packs/day: 0.00    Average packs/day: 1 pack/day for 29.0 years (29.0 ttl pk-yrs)    Types: Cigarettes    Start date: 06/14/1977    Quit date: 06/14/2006    Years since quitting: 17.2   Smokeless tobacco: Never  Vaping Use   Vaping status: Never Used  Substance and Sexual Activity   Alcohol use: No   Drug use: No   Sexual activity: Yes    Birth control/protection: None  Other Topics Concern  Not on file  Social History Narrative   Marital status: married      Children: grown children; 9 grandchildren; 1 gg      Lives: with husband, 2 birds/[employer] Camera operator      Employment:  [employer] x 18 years; billing      Tobacco: quit smoking 05/2006      Alcohol:  None     Exercise: walking 30 minutes per day   Social Determinants of Corporate investment banker Strain: Not on file  Food Insecurity: Not on file  Transportation Needs: Not on file  Physical Activity: Not on file  Stress: Not on file  Social Connections: Not on file     Family History: The patient's family history includes Cancer in her father; Diabetes in her daughter; Drug abuse in her brother and daughter; Heart disease in her daughter and paternal grandmother; Heart disease (age of onset: 29) in her mother; Hyperlipidemia in her brother and mother; Hypertension in her brother; Lupus in her daughter; Stroke in her paternal grandfather.  ROS:   Please see the history of present illness.     All other systems reviewed and are negative.  EKGs/Labs/Other Studies Reviewed:    The following studies were reviewed today: Left Heart Cath 07/20/21   Left Ventricle The left ventricular size is normal. The left ventricular systolic function is normal. LV end diastolic  pressure is normal. The left ventricular ejection fraction is 55-65% by visual estimate. No regional wall motion abnormalities.  Aortic Valve There is no aortic valve stenosis.    Coronary Diagrams   Diagnostic Dominance: Right  Intervention               Recent Labs: 11/22/2022: BUN 14; Creatinine, Ser 0.88; Hemoglobin 12.6; Platelets 217; Potassium 4.5; Sodium 138 05/23/2023: TSH 4.980 08/23/2023: ALT 40  Recent Lipid Panel    Component Value Date/Time   CHOL 109 08/08/2023 1003   CHOL 127 10/28/2014 0801   TRIG 152 (H) 08/08/2023 1003   TRIG 192 (H) 10/28/2014 0801   HDL 34 (L) 08/08/2023 1003   HDL 30 (L) 10/28/2014 0801   CHOLHDL 3.2 08/08/2023 1003   CHOLHDL 3.8 08/08/2016 0731   VLDL 22 08/08/2016 0731   LDLCALC 49 08/08/2023 1003   LDLCALC 59 10/28/2014 0801     Risk Assessment/Calculations:                Physical Exam:    VS:  BP (!) 94/52 (BP Location: Left Arm, Patient Position: Sitting, Cuff Size: Large)   Pulse 66   Ht 5' 2.5" (1.588 m)   Wt 168 lb (76.2 kg)   SpO2 95%   BMI 30.24 kg/m     Wt Readings from Last 3 Encounters:  09/19/23 168 lb (76.2 kg)  08/08/23 172 lb 3.2 oz (78.1 kg)  05/23/23 174 lb 4.8 oz (79.1 kg)     GEN:  Well nourished, well developed in no acute distress HEENT: Normal NECK: No JVD; No carotid bruits LYMPHATICS: No lymphadenopathy CARDIAC: RRR, no murmurs, rubs, gallops RESPIRATORY:  Clear to auscultation without rales, wheezing or rhonchi  ABDOMEN: Soft, non-tender, non-distended MUSCULOSKELETAL:  No edema; No deformity  SKIN: Warm and dry NEUROLOGIC:  Alert and oriented x 3 PSYCHIATRIC:  Normal affect   ASSESSMENT:    1. Coronary artery disease of native artery of native heart with stable angina pectoris (HCC)   2. Type 2 diabetes mellitus with other circulatory complication, without long-term current use of  insulin (HCC)   3. Hyperlipidemia LDL goal <70   4. Essential hypertension    PLAN:    In  order of problems listed above:  1.  CAD/old MI -s/p multiple PCIs  - stable class 2-3 angina.  -continue Imdur 90 mg daily -She is to continue Amlodipine 5 mg daily, aspirin 81 mg daily, Lipitor 80 mg daily, Zetia 10 mg daily, metoprolol succinate 25 mg daily, nitro as needed, - will reduce Brilinta to 60 mg bid for maintenance.   2.  Hyperlipidemia -Last LDL was 49, at goal -Would continue current medication regimen   3.  Diabetes -A1c 6.2%   4.  Hypertensive heart disease -Blood pressure well-controlled today  -Continue low-sodium, heart healthy diet              Medication Adjustments/Labs and Tests Ordered: Current medicines are reviewed at length with the patient today.  Concerns regarding medicines are outlined above.  No orders of the defined types were placed in this encounter.  Meds ordered this encounter  Medications   ticagrelor (BRILINTA) 60 MG TABS tablet    Sig: Take 1 tablet (60 mg total) by mouth 2 (two) times daily.    Dispense:  180 tablet    Refill:  3    Patient Instructions  Medication Instructions:  Decrease Brilinta to 60 mg twice a day Continue all other medications *If you need a refill on your cardiac medications before your next appointment, please call your pharmacy*   Lab Work: None ordered   Testing/Procedures: None ordered   Follow-Up: At [facility], you and your health needs are our priority.  As part of our continuing mission to provide you with exceptional heart care, we have created designated Provider Care Teams.  These Care Teams include your primary Cardiologist (physician) and Advanced Practice Providers (APPs -  Physician Assistants and Nurse Practitioners) who all work together to provide you with the care you need, when you need it.  We recommend signing up for the patient portal called "MyChart".  Sign up information is provided on this After Visit Summary.  MyChart is used to connect with patients for  Virtual Visits (Telemedicine).  Patients are able to view lab/test results, encounter notes, upcoming appointments, etc.  Non-urgent messages can be sent to your provider as well.   To learn more about what you can do with MyChart, go to [email or web address].    Your next appointment:  6 months    Call in Feb to schedule May appointment     Provider:  Dr.Jiayi Lengacher      Signed, Ilijah Doucet [place], Watkins  09/19/2023 10:48 AM    [facility]

## 2023-09-19 ENCOUNTER — Encounter: Payer: Self-pay | Admitting: Cardiology

## 2023-09-19 ENCOUNTER — Ambulatory Visit: Payer: Medicare PPO | Attending: Cardiology | Admitting: Cardiology

## 2023-09-19 VITALS — BP 94/52 | HR 66 | Ht 62.5 in | Wt 168.0 lb

## 2023-09-19 DIAGNOSIS — I25118 Atherosclerotic heart disease of native coronary artery with other forms of angina pectoris: Secondary | ICD-10-CM

## 2023-09-19 DIAGNOSIS — E785 Hyperlipidemia, unspecified: Secondary | ICD-10-CM | POA: Diagnosis not present

## 2023-09-19 DIAGNOSIS — I10 Essential (primary) hypertension: Secondary | ICD-10-CM | POA: Diagnosis not present

## 2023-09-19 DIAGNOSIS — E1159 Type 2 diabetes mellitus with other circulatory complications: Secondary | ICD-10-CM | POA: Diagnosis not present

## 2023-09-19 MED ORDER — TICAGRELOR 60 MG PO TABS: 60.0000 mg | ORAL_TABLET | Freq: Two times a day (BID) | ORAL | 3 refills | Status: DC

## 2023-09-19 NOTE — Patient Instructions (Signed)
Medication Instructions:  Decrease Brilinta to 60 mg twice a day Continue all other medications *If you need a refill on your cardiac medications before your next appointment, please call your pharmacy*   Lab Work: None ordered   Testing/Procedures: None ordered   Follow-Up: At [facility], you and your health needs are our priority.  As part of our continuing mission to provide you with exceptional heart care, we have created designated Provider Care Teams.  These Care Teams include your primary Cardiologist (physician) and Advanced Practice Providers (APPs -  Physician Assistants and Nurse Practitioners) who all work together to provide you with the care you need, when you need it.  We recommend signing up for the patient portal called "MyChart".  Sign up information is provided on this After Visit Summary.  MyChart is used to connect with patients for Virtual Visits (Telemedicine).  Patients are able to view lab/test results, encounter notes, upcoming appointments, etc.  Non-urgent messages can be sent to your provider as well.   To learn more about what you can do with MyChart, go to [email or web address].    Your next appointment:  6 months    Call in Feb to schedule May appointment     Provider:  Dr.Jordan

## 2023-09-25 ENCOUNTER — Encounter (HOSPITAL_BASED_OUTPATIENT_CLINIC_OR_DEPARTMENT_OTHER): Payer: Self-pay | Admitting: Family Medicine

## 2023-09-25 ENCOUNTER — Ambulatory Visit (HOSPITAL_BASED_OUTPATIENT_CLINIC_OR_DEPARTMENT_OTHER): Payer: Medicare PPO | Admitting: Family Medicine

## 2023-09-25 VITALS — BP 108/49 | HR 63 | Temp 97.9°F | Ht 62.5 in | Wt 168.3 lb

## 2023-09-25 DIAGNOSIS — E1159 Type 2 diabetes mellitus with other circulatory complications: Secondary | ICD-10-CM

## 2023-09-25 DIAGNOSIS — Z7984 Long term (current) use of oral hypoglycemic drugs: Secondary | ICD-10-CM

## 2023-09-25 DIAGNOSIS — R7989 Other specified abnormal findings of blood chemistry: Secondary | ICD-10-CM | POA: Diagnosis not present

## 2023-09-25 NOTE — Assessment & Plan Note (Signed)
Patient reports that she has been doing well, continues with medications as prescribed. Last hemoglobin A1c about 6 weeks ago checked through cardiology was at goal - 6.2%.  No issues with polyuria or polydipsia. Foot exam completed today, documented in chart.  Does have some evidence of neuropathy, a few long toenails.  Did discuss possible referral to podiatry to assist with nail care given underlying neuropathy.  Patient declines at this time, however will consider.

## 2023-09-25 NOTE — Progress Notes (Signed)
    Procedures performed today:    None.  Independent interpretation of notes and tests performed by another provider:   None.  Brief History, Exam, Impression, and Recommendations:    BP (!) 108/49 (BP Location: Left Arm, Patient Position: Sitting, Cuff Size: Normal)   Pulse 63   Temp 97.9 F (36.6 C)   Ht 5' 2.5" (1.588 m)   Wt 168 lb 4.8 oz (76.3 kg)   SpO2 99%   BMI 30.29 kg/m   Type 2 diabetes mellitus with other circulatory complication, without long-term current use of insulin (HCC) Assessment & Plan: Patient reports that she has been doing well, continues with medications as prescribed. Last hemoglobin A1c about 6 weeks ago checked through cardiology was at goal - 6.2%.  No issues with polyuria or polydipsia. Foot exam completed today, documented in chart.  Does have some evidence of neuropathy, a few long toenails.  Did discuss possible referral to podiatry to assist with nail care given underlying neuropathy.  Patient declines at this time, however will consider.   Elevated TSH Assessment & Plan: Recent labs showed continued slight elevation in TSH, with normal free T4 and T3.  TSH elevation is within expected range for patient age.  Given this, can continue with monitoring, no treatment needed at this time.   Return in about 4 months (around 01/23/2024) for diabetes, elevated TSH.   ___________________________________________ Suzanne Mussell de Peru, MD, ABFM, [facility] Primary Care and Sports Medicine [facility]

## 2023-09-25 NOTE — Assessment & Plan Note (Signed)
Recent labs showed continued slight elevation in TSH, with normal free T4 and T3.  TSH elevation is within expected range for patient age.  Given this, can continue with monitoring, no treatment needed at this time.

## 2023-10-02 ENCOUNTER — Other Ambulatory Visit (HOSPITAL_BASED_OUTPATIENT_CLINIC_OR_DEPARTMENT_OTHER): Payer: Self-pay | Admitting: *Deleted

## 2023-10-02 DIAGNOSIS — G6289 Other specified polyneuropathies: Secondary | ICD-10-CM

## 2023-10-02 MED ORDER — GABAPENTIN 100 MG PO CAPS: 100.0000 mg | ORAL_CAPSULE | Freq: Every day | ORAL | 0 refills | Status: DC

## 2023-10-18 ENCOUNTER — Encounter (HOSPITAL_BASED_OUTPATIENT_CLINIC_OR_DEPARTMENT_OTHER): Payer: Self-pay | Admitting: *Deleted

## 2023-12-07 ENCOUNTER — Encounter (HOSPITAL_BASED_OUTPATIENT_CLINIC_OR_DEPARTMENT_OTHER): Payer: Self-pay

## 2024-01-01 ENCOUNTER — Other Ambulatory Visit (HOSPITAL_BASED_OUTPATIENT_CLINIC_OR_DEPARTMENT_OTHER): Payer: Self-pay | Admitting: *Deleted

## 2024-01-01 DIAGNOSIS — G6289 Other specified polyneuropathies: Secondary | ICD-10-CM

## 2024-01-01 MED ORDER — GABAPENTIN 100 MG PO CAPS: 100.0000 mg | ORAL_CAPSULE | Freq: Every day | ORAL | 0 refills | Status: DC

## 2024-01-22 ENCOUNTER — Telehealth (HOSPITAL_BASED_OUTPATIENT_CLINIC_OR_DEPARTMENT_OTHER): Payer: Self-pay | Admitting: *Deleted

## 2024-01-22 ENCOUNTER — Other Ambulatory Visit (HOSPITAL_BASED_OUTPATIENT_CLINIC_OR_DEPARTMENT_OTHER): Payer: Self-pay | Admitting: *Deleted

## 2024-01-22 NOTE — Telephone Encounter (Signed)
 Patient requesting a refill on janumet XR 50mg /1000mg  tablets sent to [facility] pharmacy.  I did not see this on patient medication list. Would you like to refill?

## 2024-01-23 ENCOUNTER — Ambulatory Visit (HOSPITAL_BASED_OUTPATIENT_CLINIC_OR_DEPARTMENT_OTHER): Payer: Medicare PPO | Admitting: Family Medicine

## 2024-01-23 ENCOUNTER — Encounter (HOSPITAL_BASED_OUTPATIENT_CLINIC_OR_DEPARTMENT_OTHER): Payer: Self-pay | Admitting: Family Medicine

## 2024-01-23 VITALS — BP 128/64 | HR 68 | Ht 62.0 in | Wt 155.7 lb

## 2024-01-23 DIAGNOSIS — E1159 Type 2 diabetes mellitus with other circulatory complications: Secondary | ICD-10-CM | POA: Diagnosis not present

## 2024-01-23 DIAGNOSIS — R7989 Other specified abnormal findings of blood chemistry: Secondary | ICD-10-CM | POA: Diagnosis not present

## 2024-01-23 DIAGNOSIS — Z7984 Long term (current) use of oral hypoglycemic drugs: Secondary | ICD-10-CM

## 2024-01-23 MED ORDER — JANUMET XR 50-1000 MG PO TB24
2.0000 | ORAL_TABLET | Freq: Every morning | ORAL | 1 refills | Status: DC
Start: 2024-01-23 — End: 2024-06-24
  Filled 2024-04-16: qty 180, 90d supply, fill #0

## 2024-01-23 NOTE — Assessment & Plan Note (Addendum)
 Patient reports that she has been doing well, continues with medications as prescribed. Last hemoglobin A1c checked several months ago ago was at goal - 6.2%.  No issues with polyuria or polydipsia. Does have some evidence of neuropathy, a few long toenails.  Did discuss possible referral to podiatry to assist with nail care given underlying neuropathy.  Patient declines at this time, however will consider. We will check labs today as below for monitoring

## 2024-01-23 NOTE — Progress Notes (Signed)
    Procedures performed today:    None.  Independent interpretation of notes and tests performed by another provider:   None.  Brief History, Exam, Impression, and Recommendations:    BP 128/64 (BP Location: Left Arm, Patient Position: Sitting, Cuff Size: Normal)   Pulse 68   Ht 5\' 2"  (1.575 m)   Wt 155 lb 11.2 oz (70.6 kg)   SpO2 99%   BMI 28.48 kg/m   Type 2 diabetes mellitus with other circulatory complication, without long-term current use of insulin (HCC) Assessment & Plan: Patient reports that she has been doing well, continues with medications as prescribed. Last hemoglobin A1c checked several months ago ago was at goal - 6.2%.  No issues with polyuria or polydipsia. Does have some evidence of neuropathy, a few long toenails.  Did discuss possible referral to podiatry to assist with nail care given underlying neuropathy.  Patient declines at this time, however will consider. We will check labs today as below for monitoring  Orders: -     Microalbumin / creatinine urine ratio -     Janumet XR; Take 2 tablets by mouth every morning.  Dispense: 180 tablet; Refill: 1 -     Basic metabolic panel -     Hemoglobin A1c  Elevated TSH Assessment & Plan: Recent labs reassuring with normal TSH as well as normal free T4 and total T3.  No current symptoms suggestive of overt thyroid disease. For now, can continue with monitoring.  Will plan to recheck labs today  Orders: -     TSH + free T4 -     T3  Return in about 4 months (around 05/24/2024) for diabetes, hypertension.   ___________________________________________ Suzanne Starkel de Peru, MD, ABFM, [facility] Primary Care and Sports Medicine [facility]

## 2024-01-23 NOTE — Assessment & Plan Note (Addendum)
 Recent labs reassuring with normal TSH as well as normal free T4 and total T3.  No current symptoms suggestive of overt thyroid disease. For now, can continue with monitoring.  Will plan to recheck labs today

## 2024-01-23 NOTE — Patient Instructions (Signed)
  Medication Instructions:  Your physician recommends that you continue on your current medications as directed. Please refer to the Current Medication list given to you today. --If you need a refill on any your medications before your next appointment, please call your pharmacy first. If no refills are authorized on file call the office.-- Lab Work: Your physician has recommended that you have lab work today: today If you have labs (blood work) drawn today and your tests are completely normal, you will receive your results via MyChart message OR a phone call from our staff.  Please ensure you check your voicemail in the event that you authorized detailed messages to be left on a delegated number. If you have any lab test that is abnormal or we need to change your treatment, we will call you to review the results.    Follow-Up: Your next appointment:   Your physician recommends that you schedule a follow-up appointment in: 4-6 month follow up  with Dr. de Peru  You will receive a text message or e-mail with a link to a survey about your care and experience with [place] today! We would greatly appreciate your feedback!   Thanks for letting us be apart of your health journey!!  Primary Care and Sports Medicine   Dr. Ceasar Mons Peru   We encourage you to activate your patient portal called "MyChart".  Sign up information is provided on this After Visit Summary.  MyChart is used to connect with patients for Virtual Visits (Telemedicine).  Patients are able to view lab/test results, encounter notes, upcoming appointments, etc.  Non-urgent messages can be sent to your provider as well. To learn more about what you can do with MyChart, please visit --  [email or web address].

## 2024-01-24 LAB — HEMOGLOBIN A1C
Est. average glucose Bld gHb Est-mCnc: 114 mg/dL
Hgb A1c MFr Bld: 5.6 % (ref 4.8–5.6)

## 2024-01-24 LAB — MICROALBUMIN / CREATININE URINE RATIO
Creatinine, Urine: 70.4 mg/dL
Microalb/Creat Ratio: 46 mg/g{creat} — ABNORMAL HIGH (ref 0–29)
Microalbumin, Urine: 32.1 ug/mL

## 2024-01-24 LAB — BASIC METABOLIC PANEL
BUN/Creatinine Ratio: 16 (ref 12–28)
BUN: 14 mg/dL (ref 8–27)
CO2: 20 mmol/L (ref 20–29)
Calcium: 9.6 mg/dL (ref 8.7–10.3)
Chloride: 101 mmol/L (ref 96–106)
Creatinine, Ser: 0.87 mg/dL (ref 0.57–1.00)
Glucose: 133 mg/dL — ABNORMAL HIGH (ref 70–99)
Potassium: 4.9 mmol/L (ref 3.5–5.2)
Sodium: 138 mmol/L (ref 134–144)
eGFR: 72 mL/min/{1.73_m2} (ref >59)

## 2024-01-24 LAB — T3: T3, Total: 145 ng/dL (ref 71–180)

## 2024-01-24 LAB — TSH+FREE T4
Free T4: 1.17 ng/dL (ref 0.82–1.77)
TSH: 4.47 u[IU]/mL (ref 0.450–4.500)

## 2024-01-30 ENCOUNTER — Other Ambulatory Visit: Payer: Self-pay | Admitting: Interventional Cardiology

## 2024-02-26 ENCOUNTER — Encounter (HOSPITAL_BASED_OUTPATIENT_CLINIC_OR_DEPARTMENT_OTHER): Payer: Self-pay | Admitting: Family Medicine

## 2024-02-27 ENCOUNTER — Encounter (HOSPITAL_BASED_OUTPATIENT_CLINIC_OR_DEPARTMENT_OTHER): Payer: Medicare PPO

## 2024-03-11 ENCOUNTER — Encounter: Payer: Self-pay | Admitting: Cardiology

## 2024-03-11 ENCOUNTER — Ambulatory Visit: Admitting: Nurse Practitioner

## 2024-03-14 ENCOUNTER — Other Ambulatory Visit: Payer: Self-pay

## 2024-03-14 ENCOUNTER — Encounter (HOSPITAL_COMMUNITY): Payer: Self-pay | Admitting: Emergency Medicine

## 2024-03-14 ENCOUNTER — Emergency Department (HOSPITAL_COMMUNITY)

## 2024-03-14 ENCOUNTER — Emergency Department (HOSPITAL_COMMUNITY)
Admission: EM | Admit: 2024-03-14 | Discharge: 2024-03-15 | Disposition: A | Discharge status: Home / Self Care | Attending: Emergency Medicine | Admitting: Emergency Medicine

## 2024-03-14 DIAGNOSIS — Z87891 Personal history of nicotine dependence: Secondary | ICD-10-CM | POA: Diagnosis not present

## 2024-03-14 DIAGNOSIS — Z79899 Other long term (current) drug therapy: Secondary | ICD-10-CM | POA: Insufficient documentation

## 2024-03-14 DIAGNOSIS — Z91040 Latex allergy status: Secondary | ICD-10-CM | POA: Insufficient documentation

## 2024-03-14 DIAGNOSIS — E119 Type 2 diabetes mellitus without complications: Secondary | ICD-10-CM | POA: Diagnosis not present

## 2024-03-14 DIAGNOSIS — I209 Angina pectoris, unspecified: Secondary | ICD-10-CM | POA: Diagnosis not present

## 2024-03-14 DIAGNOSIS — Z7982 Long term (current) use of aspirin: Secondary | ICD-10-CM | POA: Diagnosis not present

## 2024-03-14 DIAGNOSIS — Z8541 Personal history of malignant neoplasm of cervix uteri: Secondary | ICD-10-CM | POA: Diagnosis not present

## 2024-03-14 DIAGNOSIS — Z955 Presence of coronary angioplasty implant and graft: Secondary | ICD-10-CM | POA: Diagnosis not present

## 2024-03-14 DIAGNOSIS — I25119 Atherosclerotic heart disease of native coronary artery with unspecified angina pectoris: Secondary | ICD-10-CM

## 2024-03-14 DIAGNOSIS — I10 Essential (primary) hypertension: Secondary | ICD-10-CM | POA: Diagnosis not present

## 2024-03-14 DIAGNOSIS — R079 Chest pain, unspecified: Secondary | ICD-10-CM | POA: Diagnosis not present

## 2024-03-14 NOTE — ED Triage Notes (Signed)
 Pt in with dull chest pain ongoing x 1 wk. Hx of MI with stent placement. Pt of Dr. [place], Toprol  was increased to 1.5 tabs on 5/12 per his recommendations. Pt has taken 2 NTG tabs PTA with little relief, pain 3/10 currently. +sob with exertion

## 2024-03-14 NOTE — H&P (View-Only) (Signed)
 Cardiology Office Note:    Date:  03/18/2024   ID:  OLUWASEYI Watkins, DOB [birth year], MRN [id number]  PCP:  de [place], Raymond J, MD   [facility] Providers Cardiologist:  Clifton Safley [place], MD     Referring MD: de [place], Alonza Jansky, MD   Chief Complaint  Patient presents with   Coronary Artery Disease   Chest Pain    History of Present Illness:    Suzanne Watkins is a 73 y.o. female seen for follow up CAD. She is a former patient of Dr Jacquelynn Matter. She has a history of DM, HLD, HTN. She has had multiple stents in the past beginning in 2009 and last in Sept 2022. At that time she had Scoring balloon angioplasty for in stent restenosis of the OM. Noted severe calcification and noted that if stenosis recurred would consider Shockwave therapy. There was also some discussion about drug coated balloon when available.   On follow up today she notes that a couple of months ago she was able to walk 20 minutes in her house without significant angina. Since then she notes she can only walk 3 minutes before having to stop due to chest pain/pressure. Discomfort with rest then she can continue. Was seen in ED 3 days ago. Ecg and troponins were negative.   Past Medical History:  Diagnosis Date   Anginal pain (HCC)    Arthritis    "probably in my hands" (09/04/2013)   Basal cell carcinoma of nostril 05/2008   Cancer (HCC)    Phreesia 05/17/2020   Cervical cancer (HCC) 1986   Coronary atherosclerosis of native coronary artery 09/04/2013   RCA and obtuse marginal stent/DES-2010   Diabetes mellitus without complication (HCC)    Phreesia 05/17/2020   Displaced fracture of distal end of right fibula 10/10/2018   Fibromyalgia    "dx'd in 1994"   Full dentures    GERD (gastroesophageal reflux disease)    Hyperlipidemia    Hypertension    Myocardial infarction (HCC)    Phreesia 05/17/2020   Obesity    Shortness of breath    "just related to angina I was having recently" (09/04/2013)   Type II  diabetes mellitus (HCC)    followed by pcp   Unstable angina (HCC) 09/04/2013   Wears glasses     Past Surgical History:  Procedure Laterality Date   CARPAL TUNNEL RELEASE Right 07/1992   CERVICAL CONE BIOPSY  03/1985   CORONARY ANGIOPLASTY WITH STENT PLACEMENT  07/2009, 08/2009; 09/04/2013   "1+1+2; total of 4" (09/04/2013)   CORONARY BALLOON ANGIOPLASTY N/A 10/06/2020   Procedure: CORONARY BALLOON ANGIOPLASTY;  Surgeon: Lucendia Rusk, MD;  Location: [facility] INVASIVE CV LAB;  Service: Cardiovascular;  Laterality: N/A;   CORONARY BALLOON ANGIOPLASTY N/A 07/30/2021   Procedure: CORONARY BALLOON ANGIOPLASTY;  Surgeon: Lucendia Rusk, MD;  Location: [facility] INVASIVE CV LAB;  Service: Cardiovascular;  Laterality: N/A;   CORONARY STENT INTERVENTION N/A 01/15/2021   Procedure: CORONARY STENT INTERVENTION;  Surgeon: Lucendia Rusk, MD;  Location: [facility] INVASIVE CV LAB;  Service: Cardiovascular;  Laterality: N/A;   FRACTURE SURGERY N/A    Phreesia 05/17/2020   LEFT HEART CATH AND CORONARY ANGIOGRAPHY N/A 10/06/2020   Procedure: LEFT HEART CATH AND CORONARY ANGIOGRAPHY;  Surgeon: Lucendia Rusk, MD;  Location: [facility] INVASIVE CV LAB;  Service: Cardiovascular;  Laterality: N/A;   LEFT HEART CATH AND CORONARY ANGIOGRAPHY N/A 01/15/2021   Procedure: LEFT HEART CATH AND CORONARY ANGIOGRAPHY;  Surgeon:  Lucendia Rusk, MD;  [facility] INVASIVE CV LAB;  Service: Cardiovascular;  Laterality: N/A;   LEFT HEART CATH AND CORONARY ANGIOGRAPHY N/A 07/30/2021   Procedure: LEFT HEART CATH AND CORONARY ANGIOGRAPHY;  Surgeon: Lucendia Rusk, MD;  Location: [facility] INVASIVE CV LAB;  Service: Cardiovascular;  Laterality: N/A;   LEFT HEART CATHETERIZATION WITH CORONARY ANGIOGRAM N/A 09/04/2013   Procedure: LEFT HEART CATHETERIZATION WITH CORONARY ANGIOGRAM;  Surgeon: Dorothye Gathers, MD;  Location: [facility] CATH LAB;  Service: Cardiovascular;  Laterality: N/A;   MOHS SURGERY Left 05/2008   "nostril"   ORIF ANKLE FRACTURE  Right 10/10/2018   Procedure: OPEN REDUCTION INTERNAL FIXATION (ORIF) ANKLE FRACTURE;  Surgeon: Adonica Hoose, MD;  Location: [facility] ORS;  Service: Orthopedics;  Laterality: Right;   SHOULDER OPEN ROTATOR CUFF REPAIR Right 10/2004   "got 5 pins in" (09/04/2013)   TUBAL LIGATION Bilateral 1979    Current Medications: Current Meds  Medication Sig   amLODipine  (NORVASC ) 5 MG tablet TAKE 1 TABLET(5 MG) BY MOUTH IN THE MORNING AND AT BEDTIME   aspirin  81 MG chewable tablet Chew 1 tablet (81 mg total) by mouth daily.   atorvastatin  (LIPITOR ) 80 MG tablet TAKE 1 TABLET(80 MG) BY MOUTH DAILY AT 6 PM   Collagen Hydrolysate, Bovine, POWD by Does not apply route.   cyanocobalamin (VITAMIN B12) 250 MCG tablet Take 250 mcg by mouth daily.   diphenhydrAMINE  (BENADRYL ) 50 MG capsule Take 50 mg by mouth at bedtime.   ezetimibe  (ZETIA ) 10 MG tablet TAKE 1 TABLET(10 MG) BY MOUTH DAILY   gabapentin  (NEURONTIN ) 100 MG capsule Take 1 capsule (100 mg total) by mouth at bedtime. TAKE 1 TO 3 CAPSULES(100 TO 300 MG) BY MOUTH AT BEDTIME   glucose blood (ACCU-CHEK GUIDE) test strip Use as instructed   isosorbide  mononitrate (IMDUR ) 60 MG 24 hr tablet Take 1 tablet (60 mg total) by mouth daily. (Patient taking differently: Take 90 mg by mouth daily.)   KRILL OIL PO Take by mouth.   metoprolol  succinate (TOPROL -XL) 25 MG 24 hr tablet TAKE 1 TABLET(25 MG) BY MOUTH DAILY (Patient taking differently: Take 37.5 mg by mouth daily. TAKE 1 TABLET(25 MG) BY MOUTH DAILY)   Multiple Vitamin (MULTIVITAMIN WITH MINERALS) TABS tablet Take 1 tablet by mouth at bedtime. Centrum Silver for Women   nitroGLYCERIN  (NITROSTAT ) 0.4 MG SL tablet Place 1 tablet (0.4 mg total) under the tongue every 5 (five) minutes as needed for chest pain.   omeprazole (PRILOSEC) 20 MG capsule Take 20 mg by mouth daily before breakfast.    SitaGLIPtin -MetFORMIN  HCl (JANUMET  XR) 50-1000 MG TB24 Take 2 tablets by mouth every morning.   ticagrelor  (BRILINTA ) 60 MG  TABS tablet Take 1 tablet (60 mg total) by mouth 2 (two) times daily.     Allergies:   Plavix [clopidogrel bisulfate], Adhesive [tape], and Latex   Social History   Socioeconomic History   Marital status: Married    Spouse name: Not on file   Number of children: Not on file   Years of education: Not on file   Highest education level: Some college, no degree  Occupational History   Not on file  Tobacco Use   Smoking status: Former    Current packs/day: 0.00    Average packs/day: 1 pack/day for 29.0 years (29.0 ttl pk-yrs)    Types: Cigarettes    Start date: 06/14/1977    Quit date: 06/14/2006    Years since quitting: 17.7    Passive exposure: Past  Smokeless tobacco: Never  Vaping Use   Vaping status: Never Used  Substance and Sexual Activity   Alcohol  use: No   Drug use: No   Sexual activity: Yes    Birth control/protection: None  Other Topics Concern   Not on file  Social History Narrative   Marital status: married      Children: grown children; 9 grandchildren; 1 gg      Lives: with husband, 2 birds/[employer] operator      Employment:  [employer] x 18 years; billing      Tobacco: quit smoking 05/2006      Alcohol :  None     Exercise: walking 30 minutes per day   Social Drivers of Health   Financial Resource Strain: Low Risk  (01/19/2024)   Overall Financial Resource Strain (CARDIA)    Difficulty of Paying Living Expenses: Not hard at all  Food Insecurity: No Food Insecurity (01/19/2024)   Hunger Vital Sign    Worried About Running Out of Food in the Last Year: Never true    Ran Out of Food in the Last Year: Never true  Transportation Needs: No Transportation Needs (01/19/2024)   PRAPARE - Administrator, Civil Service (Medical): No    Lack of Transportation (Non-Medical): No  Physical Activity: Sufficiently Active (01/19/2024)   Exercise Vital Sign    Days of Exercise per Week: 6 days    Minutes of Exercise per Session: 30 min  Stress: Stress Concern  Present (01/19/2024)   [employer] of Occupational Health - Occupational Stress Questionnaire    Feeling of Stress : To some extent  Social Connections: Socially Isolated (01/19/2024)   Social Connection and Isolation Panel [NHANES]    Frequency of Communication with Friends and Family: Never    Frequency of Social Gatherings with Friends and Family: Never    Attends Religious Services: Never    Diplomatic Services operational officer: No    Attends Engineer, structural: Not on file    Marital Status: Married     Family History: The patient's family history includes Cancer in her father; Diabetes in her daughter; Drug abuse in her brother and daughter; Heart disease in her daughter and paternal grandmother; Heart disease (age of onset: 11) in her mother; Hyperlipidemia in her brother and mother; Hypertension in her brother; Lupus in her daughter; Stroke in her paternal grandfather.  ROS:   Please see the history of present illness.     All other systems reviewed and are negative.  EKGs/Labs/Other Studies Reviewed:    The following studies were reviewed today: Left Heart Cath 07/20/21   Left Ventricle The left ventricular size is normal. The left ventricular systolic function is normal. LV end diastolic pressure is normal. The left ventricular ejection fraction is 55-65% by visual estimate. No regional wall motion abnormalities.  Aortic Valve There is no aortic valve stenosis.    Coronary Diagrams   Diagnostic Dominance: Right  Intervention               Recent Labs: 08/23/2023: ALT 40 01/23/2024: TSH 4.470 03/14/2024: Hemoglobin 13.0; Platelets 224 03/15/2024: BUN 20; Creatinine, Ser 0.97; Potassium 4.2; Sodium 138  Recent Lipid Panel    Component Value Date/Time   CHOL 109 08/08/2023 1003   CHOL 127 10/28/2014 0801   TRIG 152 (H) 08/08/2023 1003   TRIG 192 (H) 10/28/2014 0801   HDL 34 (L) 08/08/2023 1003   HDL 30 (L) 10/28/2014 0801  CHOLHDL 3.2  08/08/2023 1003   CHOLHDL 3.8 08/08/2016 0731   VLDL 22 08/08/2016 0731   LDLCALC 49 08/08/2023 1003   LDLCALC 59 10/28/2014 0801     Risk Assessment/Calculations:                Physical Exam:    VS:  BP 124/60 (BP Location: Left Arm, Patient Position: Sitting, Cuff Size: Normal)   Pulse 67   Ht 5\' 2"  (1.575 m)   Wt 158 lb (71.7 kg)   SpO2 98%   BMI 28.90 kg/m     Wt Readings from Last 3 Encounters:  03/18/24 158 lb (71.7 kg)  03/14/24 155 lb 10.3 oz (70.6 kg)  01/23/24 155 lb 11.2 oz (70.6 kg)     GEN:  Well nourished, well developed in no acute distress HEENT: Normal NECK: No JVD; No carotid bruits LYMPHATICS: No lymphadenopathy CARDIAC: RRR, no murmurs, rubs, gallops RESPIRATORY:  Clear to auscultation without rales, wheezing or rhonchi  ABDOMEN: Soft, non-tender, non-distended MUSCULOSKELETAL:  No edema; No deformity  SKIN: Warm and dry NEUROLOGIC:  Alert and oriented x 3 PSYCHIATRIC:  Normal affect   ASSESSMENT:    1. Coronary artery disease involving native coronary artery of native heart with unstable angina pectoris (HCC)   2. Type 2 diabetes mellitus with other circulatory complication, without long-term current use of insulin  (HCC)   3. Hyperlipidemia LDL goal <70   4. Essential hypertension     PLAN:    In order of problems listed above:  1.  CAD/old MI -s/p multiple PCIs over the years. Last treated with scoring balloon PCI of the LCx in Sept 2022.  - appears to have increasing angina over the past 2 months.  She is on optimal medical therapy with Toprol  XL 37.5 mg daily, Imdur  90 mg daily, amlodipine  5 mg bid.  On DAPT Given current symptoms recommend repeat cardiac cath. If restenosis may consider Shockwave and/or Drug coated balloon.  The procedure and risks were reviewed including but not limited to death, myocardial infarction, stroke, arrythmias, bleeding, transfusion, emergency surgery, dye allergy, or renal dysfunction. The patient  voices understanding and is agreeable to proceed. - recommend femoral approach given small radial artery size in the past.    2.  Hyperlipidemia -Last LDL was 49, at goal -Would continue current medication regimen - update labs today   3.  Diabetes -A1c 5.7%   4.  Hypertensive heart disease -Blood pressure well-controlled today  -Continue low-sodium, heart healthy diet              Medication Adjustments/Labs and Tests Ordered: Current medicines are reviewed at length with the patient today.  Concerns regarding medicines are outlined above.  No orders of the defined types were placed in this encounter.  No orders of the defined types were placed in this encounter.   There are no Patient Instructions on file for this visit.   Signed, Deborahann Poteat [place], MD  03/18/2024 10:41 AM     [facility]

## 2024-03-14 NOTE — Progress Notes (Signed)
 Cardiology Office Note:    Date:  03/18/2024   ID:  Suzanne Watkins, DOB [birth year], MRN [id number]  PCP:  de [place], Raymond J, MD   [facility] Providers Cardiologist:  Clifton Safley [place], MD     Referring MD: de [place], Alonza Jansky, MD   Chief Complaint  Patient presents with   Coronary Artery Disease   Chest Pain    History of Present Illness:    Suzanne Watkins is a 73 y.o. female seen for follow up CAD. She is a former patient of Dr Jacquelynn Matter. She has a history of DM, HLD, HTN. She has had multiple stents in the past beginning in 2009 and last in Sept 2022. At that time she had Scoring balloon angioplasty for in stent restenosis of the OM. Noted severe calcification and noted that if stenosis recurred would consider Shockwave therapy. There was also some discussion about drug coated balloon when available.   On follow up today she notes that a couple of months ago she was able to walk 20 minutes in her house without significant angina. Since then she notes she can only walk 3 minutes before having to stop due to chest pain/pressure. Discomfort with rest then she can continue. Was seen in ED 3 days ago. Ecg and troponins were negative.   Past Medical History:  Diagnosis Date   Anginal pain (HCC)    Arthritis    "probably in my hands" (09/04/2013)   Basal cell carcinoma of nostril 05/2008   Cancer (HCC)    Phreesia 05/17/2020   Cervical cancer (HCC) 1986   Coronary atherosclerosis of native coronary artery 09/04/2013   RCA and obtuse marginal stent/DES-2010   Diabetes mellitus without complication (HCC)    Phreesia 05/17/2020   Displaced fracture of distal end of right fibula 10/10/2018   Fibromyalgia    "dx'd in 1994"   Full dentures    GERD (gastroesophageal reflux disease)    Hyperlipidemia    Hypertension    Myocardial infarction (HCC)    Phreesia 05/17/2020   Obesity    Shortness of breath    "just related to angina I was having recently" (09/04/2013)   Type II  diabetes mellitus (HCC)    followed by pcp   Unstable angina (HCC) 09/04/2013   Wears glasses     Past Surgical History:  Procedure Laterality Date   CARPAL TUNNEL RELEASE Right 07/1992   CERVICAL CONE BIOPSY  03/1985   CORONARY ANGIOPLASTY WITH STENT PLACEMENT  07/2009, 08/2009; 09/04/2013   "1+1+2; total of 4" (09/04/2013)   CORONARY BALLOON ANGIOPLASTY N/A 10/06/2020   Procedure: CORONARY BALLOON ANGIOPLASTY;  Surgeon: Lucendia Rusk, MD;  Location: [facility] INVASIVE CV LAB;  Service: Cardiovascular;  Laterality: N/A;   CORONARY BALLOON ANGIOPLASTY N/A 07/30/2021   Procedure: CORONARY BALLOON ANGIOPLASTY;  Surgeon: Lucendia Rusk, MD;  Location: [facility] INVASIVE CV LAB;  Service: Cardiovascular;  Laterality: N/A;   CORONARY STENT INTERVENTION N/A 01/15/2021   Procedure: CORONARY STENT INTERVENTION;  Surgeon: Lucendia Rusk, MD;  Location: [facility] INVASIVE CV LAB;  Service: Cardiovascular;  Laterality: N/A;   FRACTURE SURGERY N/A    Phreesia 05/17/2020   LEFT HEART CATH AND CORONARY ANGIOGRAPHY N/A 10/06/2020   Procedure: LEFT HEART CATH AND CORONARY ANGIOGRAPHY;  Surgeon: Lucendia Rusk, MD;  Location: [facility] INVASIVE CV LAB;  Service: Cardiovascular;  Laterality: N/A;   LEFT HEART CATH AND CORONARY ANGIOGRAPHY N/A 01/15/2021   Procedure: LEFT HEART CATH AND CORONARY ANGIOGRAPHY;  Surgeon:  Lucendia Rusk, MD;  [facility] INVASIVE CV LAB;  Service: Cardiovascular;  Laterality: N/A;   LEFT HEART CATH AND CORONARY ANGIOGRAPHY N/A 07/30/2021   Procedure: LEFT HEART CATH AND CORONARY ANGIOGRAPHY;  Surgeon: Lucendia Rusk, MD;  Location: [facility] INVASIVE CV LAB;  Service: Cardiovascular;  Laterality: N/A;   LEFT HEART CATHETERIZATION WITH CORONARY ANGIOGRAM N/A 09/04/2013   Procedure: LEFT HEART CATHETERIZATION WITH CORONARY ANGIOGRAM;  Surgeon: Dorothye Gathers, MD;  Location: [facility] CATH LAB;  Service: Cardiovascular;  Laterality: N/A;   MOHS SURGERY Left 05/2008   "nostril"   ORIF ANKLE FRACTURE  Right 10/10/2018   Procedure: OPEN REDUCTION INTERNAL FIXATION (ORIF) ANKLE FRACTURE;  Surgeon: Adonica Hoose, MD;  Location: [facility] ORS;  Service: Orthopedics;  Laterality: Right;   SHOULDER OPEN ROTATOR CUFF REPAIR Right 10/2004   "got 5 pins in" (09/04/2013)   TUBAL LIGATION Bilateral 1979    Current Medications: Current Meds  Medication Sig   amLODipine  (NORVASC ) 5 MG tablet TAKE 1 TABLET(5 MG) BY MOUTH IN THE MORNING AND AT BEDTIME   aspirin  81 MG chewable tablet Chew 1 tablet (81 mg total) by mouth daily.   atorvastatin  (LIPITOR ) 80 MG tablet TAKE 1 TABLET(80 MG) BY MOUTH DAILY AT 6 PM   Collagen Hydrolysate, Bovine, POWD by Does not apply route.   cyanocobalamin (VITAMIN B12) 250 MCG tablet Take 250 mcg by mouth daily.   diphenhydrAMINE  (BENADRYL ) 50 MG capsule Take 50 mg by mouth at bedtime.   ezetimibe  (ZETIA ) 10 MG tablet TAKE 1 TABLET(10 MG) BY MOUTH DAILY   gabapentin  (NEURONTIN ) 100 MG capsule Take 1 capsule (100 mg total) by mouth at bedtime. TAKE 1 TO 3 CAPSULES(100 TO 300 MG) BY MOUTH AT BEDTIME   glucose blood (ACCU-CHEK GUIDE) test strip Use as instructed   isosorbide  mononitrate (IMDUR ) 60 MG 24 hr tablet Take 1 tablet (60 mg total) by mouth daily. (Patient taking differently: Take 90 mg by mouth daily.)   KRILL OIL PO Take by mouth.   metoprolol  succinate (TOPROL -XL) 25 MG 24 hr tablet TAKE 1 TABLET(25 MG) BY MOUTH DAILY (Patient taking differently: Take 37.5 mg by mouth daily. TAKE 1 TABLET(25 MG) BY MOUTH DAILY)   Multiple Vitamin (MULTIVITAMIN WITH MINERALS) TABS tablet Take 1 tablet by mouth at bedtime. Centrum Silver for Women   nitroGLYCERIN  (NITROSTAT ) 0.4 MG SL tablet Place 1 tablet (0.4 mg total) under the tongue every 5 (five) minutes as needed for chest pain.   omeprazole (PRILOSEC) 20 MG capsule Take 20 mg by mouth daily before breakfast.    SitaGLIPtin -MetFORMIN  HCl (JANUMET  XR) 50-1000 MG TB24 Take 2 tablets by mouth every morning.   ticagrelor  (BRILINTA ) 60 MG  TABS tablet Take 1 tablet (60 mg total) by mouth 2 (two) times daily.     Allergies:   Plavix [clopidogrel bisulfate], Adhesive [tape], and Latex   Social History   Socioeconomic History   Marital status: Married    Spouse name: Not on file   Number of children: Not on file   Years of education: Not on file   Highest education level: Some college, no degree  Occupational History   Not on file  Tobacco Use   Smoking status: Former    Current packs/day: 0.00    Average packs/day: 1 pack/day for 29.0 years (29.0 ttl pk-yrs)    Types: Cigarettes    Start date: 06/14/1977    Quit date: 06/14/2006    Years since quitting: 17.7    Passive exposure: Past  Smokeless tobacco: Never  Vaping Use   Vaping status: Never Used  Substance and Sexual Activity   Alcohol  use: No   Drug use: No   Sexual activity: Yes    Birth control/protection: None  Other Topics Concern   Not on file  Social History Narrative   Marital status: married      Children: grown children; 9 grandchildren; 1 gg      Lives: with husband, 2 birds/[employer] operator      Employment:  [employer] x 18 years; billing      Tobacco: quit smoking 05/2006      Alcohol :  None     Exercise: walking 30 minutes per day   Social Drivers of Health   Financial Resource Strain: Low Risk  (01/19/2024)   Overall Financial Resource Strain (CARDIA)    Difficulty of Paying Living Expenses: Not hard at all  Food Insecurity: No Food Insecurity (01/19/2024)   Hunger Vital Sign    Worried About Running Out of Food in the Last Year: Never true    Ran Out of Food in the Last Year: Never true  Transportation Needs: No Transportation Needs (01/19/2024)   PRAPARE - Administrator, Civil Service (Medical): No    Lack of Transportation (Non-Medical): No  Physical Activity: Sufficiently Active (01/19/2024)   Exercise Vital Sign    Days of Exercise per Week: 6 days    Minutes of Exercise per Session: 30 min  Stress: Stress Concern  Present (01/19/2024)   [employer] of Occupational Health - Occupational Stress Questionnaire    Feeling of Stress : To some extent  Social Connections: Socially Isolated (01/19/2024)   Social Connection and Isolation Panel [NHANES]    Frequency of Communication with Friends and Family: Never    Frequency of Social Gatherings with Friends and Family: Never    Attends Religious Services: Never    Diplomatic Services operational officer: No    Attends Engineer, structural: Not on file    Marital Status: Married     Family History: The patient's family history includes Cancer in her father; Diabetes in her daughter; Drug abuse in her brother and daughter; Heart disease in her daughter and paternal grandmother; Heart disease (age of onset: 11) in her mother; Hyperlipidemia in her brother and mother; Hypertension in her brother; Lupus in her daughter; Stroke in her paternal grandfather.  ROS:   Please see the history of present illness.     All other systems reviewed and are negative.  EKGs/Labs/Other Studies Reviewed:    The following studies were reviewed today: Left Heart Cath 07/20/21   Left Ventricle The left ventricular size is normal. The left ventricular systolic function is normal. LV end diastolic pressure is normal. The left ventricular ejection fraction is 55-65% by visual estimate. No regional wall motion abnormalities.  Aortic Valve There is no aortic valve stenosis.    Coronary Diagrams   Diagnostic Dominance: Right  Intervention               Recent Labs: 08/23/2023: ALT 40 01/23/2024: TSH 4.470 03/14/2024: Hemoglobin 13.0; Platelets 224 03/15/2024: BUN 20; Creatinine, Ser 0.97; Potassium 4.2; Sodium 138  Recent Lipid Panel    Component Value Date/Time   CHOL 109 08/08/2023 1003   CHOL 127 10/28/2014 0801   TRIG 152 (H) 08/08/2023 1003   TRIG 192 (H) 10/28/2014 0801   HDL 34 (L) 08/08/2023 1003   HDL 30 (L) 10/28/2014 0801  CHOLHDL 3.2  08/08/2023 1003   CHOLHDL 3.8 08/08/2016 0731   VLDL 22 08/08/2016 0731   LDLCALC 49 08/08/2023 1003   LDLCALC 59 10/28/2014 0801     Risk Assessment/Calculations:                Physical Exam:    VS:  BP 124/60 (BP Location: Left Arm, Patient Position: Sitting, Cuff Size: Normal)   Pulse 67   Ht 5\' 2"  (1.575 m)   Wt 158 lb (71.7 kg)   SpO2 98%   BMI 28.90 kg/m     Wt Readings from Last 3 Encounters:  03/18/24 158 lb (71.7 kg)  03/14/24 155 lb 10.3 oz (70.6 kg)  01/23/24 155 lb 11.2 oz (70.6 kg)     GEN:  Well nourished, well developed in no acute distress HEENT: Normal NECK: No JVD; No carotid bruits LYMPHATICS: No lymphadenopathy CARDIAC: RRR, no murmurs, rubs, gallops RESPIRATORY:  Clear to auscultation without rales, wheezing or rhonchi  ABDOMEN: Soft, non-tender, non-distended MUSCULOSKELETAL:  No edema; No deformity  SKIN: Warm and dry NEUROLOGIC:  Alert and oriented x 3 PSYCHIATRIC:  Normal affect   ASSESSMENT:    1. Coronary artery disease involving native coronary artery of native heart with unstable angina pectoris (HCC)   2. Type 2 diabetes mellitus with other circulatory complication, without long-term current use of insulin  (HCC)   3. Hyperlipidemia LDL goal <70   4. Essential hypertension     PLAN:    In order of problems listed above:  1.  CAD/old MI -s/p multiple PCIs over the years. Last treated with scoring balloon PCI of the LCx in Sept 2022.  - appears to have increasing angina over the past 2 months.  She is on optimal medical therapy with Toprol  XL 37.5 mg daily, Imdur  90 mg daily, amlodipine  5 mg bid.  On DAPT Given current symptoms recommend repeat cardiac cath. If restenosis may consider Shockwave and/or Drug coated balloon.  The procedure and risks were reviewed including but not limited to death, myocardial infarction, stroke, arrythmias, bleeding, transfusion, emergency surgery, dye allergy, or renal dysfunction. The patient  voices understanding and is agreeable to proceed. - recommend femoral approach given small radial artery size in the past.    2.  Hyperlipidemia -Last LDL was 49, at goal -Would continue current medication regimen - update labs today   3.  Diabetes -A1c 5.7%   4.  Hypertensive heart disease -Blood pressure well-controlled today  -Continue low-sodium, heart healthy diet              Medication Adjustments/Labs and Tests Ordered: Current medicines are reviewed at length with the patient today.  Concerns regarding medicines are outlined above.  No orders of the defined types were placed in this encounter.  No orders of the defined types were placed in this encounter.   There are no Patient Instructions on file for this visit.   Signed, Deborahann Poteat [place], MD  03/18/2024 10:41 AM     [facility]

## 2024-03-15 ENCOUNTER — Other Ambulatory Visit: Payer: Self-pay

## 2024-03-15 LAB — CBC
HCT: 38.9 % (ref 36.0–46.0)
Hemoglobin: 13 g/dL (ref 12.0–15.0)
MCH: 31.2 pg (ref 26.0–34.0)
MCHC: 33.4 g/dL (ref 30.0–36.0)
MCV: 93.3 fL (ref 80.0–100.0)
Platelets: 224 10*3/uL (ref 150–400)
RBC: 4.17 MIL/uL (ref 3.87–5.11)
RDW: 13.1 % (ref 11.5–15.5)
WBC: 7.3 10*3/uL (ref 4.0–10.5)
nRBC: 0 % (ref 0.0–0.2)

## 2024-03-15 LAB — BASIC METABOLIC PANEL WITH GFR
Anion gap: 11 (ref 5–15)
BUN: 20 mg/dL (ref 8–23)
CO2: 24 mmol/L (ref 22–32)
Calcium: 9.7 mg/dL (ref 8.9–10.3)
Chloride: 103 mmol/L (ref 98–111)
Creatinine, Ser: 0.97 mg/dL (ref 0.44–1.00)
GFR, Estimated: >60 mL/min (ref >60)
Glucose, Bld: 131 mg/dL — ABNORMAL HIGH (ref 70–99)
Potassium: 4.2 mmol/L (ref 3.5–5.1)
Sodium: 138 mmol/L (ref 135–145)

## 2024-03-15 LAB — TROPONIN I (HIGH SENSITIVITY)
Troponin I (High Sensitivity): 3 ng/L (ref <18)
Troponin I (High Sensitivity): 3 ng/L (ref <18)

## 2024-03-15 MED ORDER — ASPIRIN 81 MG PO CHEW
324.0000 mg | CHEWABLE_TABLET | Freq: Once | ORAL | Status: AC
  Administered 2024-03-15: 324 mg via ORAL
  Filled 2024-03-15: qty 4

## 2024-03-15 NOTE — ED Provider Notes (Signed)
 [place] EMERGENCY DEPARTMENT AT [facility] Provider Note  CSN: [id number] Arrival date & time: 03/14/24 2319  Chief Complaint(s) Chest Pain  HPI Suzanne Watkins is a 73 y.o. female here today with "dull "chest pain that has been ongoing for about a week.  Patient has a history of coronary artery disease, has previously had stents.  Patient had been taking nitroglycerin  with some improvement.  She had been discussing the symptoms with her cardiologist who has an appointment scheduled with her on Monday.  Patient does endorse increasing exercise intolerance.  At this time, patient is chest pain-free.   Past Medical History Past Medical History:  Diagnosis Date   Anginal pain (HCC)    Arthritis    "probably in my hands" (09/04/2013)   Basal cell carcinoma of nostril 05/2008   Cancer (HCC)    Phreesia 05/17/2020   Cervical cancer (HCC) 1986   Coronary atherosclerosis of native coronary artery 09/04/2013   RCA and obtuse marginal stent/DES-2010   Diabetes mellitus without complication (HCC)    Phreesia 05/17/2020   Displaced fracture of distal end of right fibula 10/10/2018   Fibromyalgia    "dx'd in 1994"   Full dentures    GERD (gastroesophageal reflux disease)    Hyperlipidemia    Hypertension    Myocardial infarction (HCC)    Phreesia 05/17/2020   Obesity    Shortness of breath    "just related to angina I was having recently" (09/04/2013)   Type II diabetes mellitus (HCC)    followed by pcp   Unstable angina (HCC) 09/04/2013   Wears glasses    Patient Active Problem List   Diagnosis Date Noted   Elevated TSH 05/23/2023   Angina pectoris (HCC) 10/06/2020   History of tobacco use 10/14/2018   Essential hypertension 12/14/2017   Coronary atherosclerosis of native coronary artery 09/04/2013   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 Authorizing Provider  amLODipine  (NORVASC ) 5 MG tablet TAKE 1 TABLET(5 MG) BY MOUTH IN THE MORNING AND AT BEDTIME 01/31/24   Swaziland, Peter M, MD  aspirin  81 MG chewable tablet Chew 1 tablet (81 mg total) by mouth daily. 10/07/20   Sanjuanita Cruz, NP  atorvastatin  (LIPITOR ) 80 MG tablet TAKE 1 TABLET(80 MG) BY MOUTH DAILY AT 6 PM 03/30/23   Lucendia Rusk, MD  Collagen Hydrolysate, Bovine, POWD by Does not apply route.    [provider]  cyanocobalamin (VITAMIN B12) 250 MCG tablet Take 250 mcg by mouth daily.    [provider]  diphenhydrAMINE  (BENADRYL ) 50 MG capsule Take 50 mg by mouth at bedtime.    [provider]  ezetimibe  (ZETIA ) 10 MG tablet TAKE 1 TABLET(10 MG) BY MOUTH DAILY 03/30/23   Lucendia Rusk, MD  gabapentin  (NEURONTIN ) 100 MG capsule Take 1 capsule (100 mg total) by mouth at bedtime. TAKE 1 TO 3 CAPSULES(100 TO 300 MG) BY MOUTH AT BEDTIME 01/01/24   de [place], Alonza Jansky, MD  glucose blood (ACCU-CHEK GUIDE) test strip Use as instructed 10/11/22   [place], Alonza Jansky, MD  isosorbide  mononitrate (IMDUR ) 60 MG 24 hr tablet Take 1 tablet (60 mg total) by mouth daily. 01/30/24   Swazil[place], Peter M, MD  KRILL OIL PO Take by  mouth.    [provider]  metoprolol  succinate (TOPROL -XL) 25 MG 24 hr tablet TAKE 1 TABLET(25 MG) BY MOUTH DAILY 01/31/24   Swazil[place], Peter M, MD  Multiple Vitamin (MULTIVITAMIN WITH MINERALS) TABS tablet Take 1 tablet by mouth at bedtime. Centrum Silver for Women    [provider]  nitroGLYCERIN  (NITROSTAT ) 0.4 MG SL tablet Place 1 tablet (0.4 mg total) under the tongue every 5 (five) minutes as needed for chest pain. 07/27/22   Lucendia Rusk, MD  omeprazole (PRILOSEC) 20 MG capsule Take 20 mg by mouth daily before breakfast.     [provider]  SitaGLIPtin -MetFORMIN  HCl (JANUMET  XR) 50-1000 MG TB24 Take 2 tablets by  mouth every morning. 01/23/24   [place], Alonza Jansky, MD  ticagrelor  (BRILINTA ) 60 MG TABS tablet Take 1 tablet (60 mg total) by mouth 2 (two) times daily. 09/19/23   [place], Peter M, MD                                                                                                                                    Past Surgical History Past Surgical History:  Procedure Laterality Date   CARPAL TUNNEL RELEASE Right 07/1992   CERVICAL CONE BIOPSY  03/1985   CORONARY ANGIOPLASTY WITH STENT PLACEMENT  07/2009, 08/2009; 09/04/2013   "1+1+2; total of 4" (09/04/2013)   CORONARY BALLOON ANGIOPLASTY N/A 10/06/2020   Procedure: CORONARY BALLOON ANGIOPLASTY;  Surgeon: Lucendia Rusk, MD;  Location: [facility] INVASIVE CV LAB;  Service: Cardiovascular;  Laterality: N/A;   CORONARY BALLOON ANGIOPLASTY N/A 07/30/2021   Procedure: CORONARY BALLOON ANGIOPLASTY;  Surgeon: Lucendia Rusk, MD;  Location: [facility] INVASIVE CV LAB;  Service: Cardiovascular;  Laterality: N/A;   CORONARY STENT INTERVENTION N/A 01/15/2021   Procedure: CORONARY STENT INTERVENTION;  Surgeon: Lucendia Rusk, MD;  Location[facility] INVASIVE CV LAB;  Service: Cardiovascular;  Laterality: N/A;   FRACTURE SURGERY N/A    Phreesia 05/17/2020   LEFT HEART CATH AND CORONARY ANGIOGRAPHY N/A 10/06/2020   Procedure: LEFT HEART CATH AND CORONARY ANGIOGRAPHY;  Surgeon: Lucendia Rusk, MD;  Location: [facility] INVASIVE CV LAB;  Service: Cardiovascular;  Laterality: N/A;   LEFT HEART CATH AND CORONARY ANGIOGRAPHY N/A 01/15/2021   Procedure: LEFT HEART CATH AND CORONARY ANGIOGRAPHY;  Surgeon: Lucendia Rusk, MD;  Location: [facility] INVASIVE CV LAB;  Service: Cardiovascular;  Laterality: N/A;   LEFT HEART CATH AND CORONARY ANGIOGRAPHY N/A 07/30/2021   Procedure: LEFT HEART CATH AND CORONARY ANGIOGRAPHY;  Surgeon: Lucendia Rusk, MD;  Location: [facility] INVASIVE CV LAB;  Service: Cardiovascular;  Laterality: N/A;   LEFT HEART CATHETERIZATION WITH CORONARY ANGIOGRAM  N/A 09/04/2013   Procedure: LEFT HEART CATHETERIZATION WITH CORONARY ANGIOGRAM;  Surgeon: Dorothye Gathers, MD;  Location: [facility] CATH LAB;  Service: Cardiovascular;  Laterality: N/A;   MOHS SURGERY Left 05/2008   "nostril"   ORIF ANKLE FRACTURE Right 10/10/2018   Procedure:  OPEN REDUCTION INTERNAL FIXATION (ORIF) ANKLE FRACTURE;  Surgeon: Adonica Hoose, MD;  Location: [facility] ORS;  Service: Orthopedics;  Laterality: Right;   SHOULDER OPEN ROTATOR CUFF REPAIR Right 10/2004   "got 5 pins in" (09/04/2013)   TUBAL LIGATION Bilateral 1979   Family History Family History  Problem Relation Age of Onset   Heart disease Mother 47       AMI; cause of death   Hyperlipidemia Mother    Cancer Father        Throat   Hyperlipidemia Brother    Hypertension Brother    Drug abuse Brother    Diabetes Daughter    Drug abuse Daughter    Heart disease Daughter    Lupus Daughter    Heart disease Paternal Grandmother    Stroke Paternal Grandfather     Social History Social History   Tobacco Use   Smoking status: Former    Current packs/day: 0.00    Average packs/day: 1 pack/day for 29.0 years (29.0 ttl pk-yrs)    Types: Cigarettes    Start date: 06/14/1977    Quit date: 06/14/2006    Years since quitting: 17.7    Passive exposure: Past   Smokeless tobacco: Never  Vaping Use   Vaping status: Never Used  Substance Use Topics   Alcohol  use: No   Drug use: No   Allergies Plavix [clopidogrel bisulfate], Adhesive [tape], and Latex  Review of Systems Review of Systems  Physical Exam Vital Signs  I have reviewed the triage vital signs BP (!) 130/107   Pulse 75   Temp 98.1 F (36.7 C)   Resp 16   Wt 70.6 kg   SpO2 99%   BMI 28.47 kg/m   Physical Exam Vitals reviewed.  Cardiovascular:     Rate and Rhythm: Normal rate and regular rhythm.  Pulmonary:     Effort: Pulmonary effort is normal.     Breath sounds: Normal breath sounds.  Musculoskeletal:     Right lower leg: No edema.     Left lower  leg: No edema.  Neurological:     Mental Status: She is alert.     ED Results and Treatments Labs (all labs ordered are listed, but only abnormal results are displayed) Labs Reviewed  BASIC METABOLIC PANEL WITH GFR - Abnormal; Notable for the following components:      Result Value   Glucose, Bld 131 (*)    All other components within normal limits  CBC  TROPONIN I (HIGH SENSITIVITY)  TROPONIN I (HIGH SENSITIVITY)                                                                                                                          Radiology DG Chest 2 View Result Date: 03/15/2024 CLINICAL DATA:  Chest pain x1 week. EXAM: CHEST - 2 VIEW COMPARISON:  Chest PA Lat 09/14/2021. FINDINGS: The heart size and mediastinal contours are within normal limits. There are multiple coronary artery stents.  Both lungs are clear. The visualized skeletal structures are intact, with old surgical changes in the right humeral head, chronic thoracic spondylosis, mild osteopenia. IMPRESSION: No evidence of acute chest disease. Coronary artery stents. Electronically Signed   By: Denman Fischer M.D.   On: 03/15/2024 00:03    Pertinent labs & imaging results that were available during my care of the patient were reviewed by me and considered in my medical decision making (see MDM for details).  Medications Ordered in ED Medications  aspirin  chewable tablet 324 mg (324 mg Oral Given 03/15/24 0056)                                                                                                                                     Procedures Procedures  (including critical care time)  Medical Decision Making / ED Course   This patient presents to the ED for concern of chest pain, this involves an extensive number of treatment options, and is a complaint that carries with it a high risk of complications and morbidity.  The differential diagnosis includes stable angina, ACS, less likely PE, less likely  dissection.  MDM: With the duration of patient's symptoms, characteristics of pressure with gradual exercise intolerance, do believe the patient has some degree of worsening ischemia.  Her symptoms are consistent with stable angina.  She is not currently having chest pain at this time.  I reviewed the patient's EKG, no changes compared to prior, no acute ischemia.  Her troponins are negative x 2.  I discussed patient's workup with her and family at bedside.  With the patient's heart score of 4, we discussed getting cardiology consultation in the ED versus close outpatient follow-up which the patient already has.  Patient preferred to proceed with close outpatient follow-up.  Return precautions were discussed with the patient at bedside.  She will return to the emergency room if she develops any new or worsening pain, shortness of breath.  Will discharge.   Additional history obtained: -Additional history obtained from family at bedside -External records from outside source obtained and reviewed including: Chart review including previous notes, labs, imaging, consultation notes   Lab Tests: -I ordered, reviewed, and interpreted labs.   The pertinent results include:   Labs Reviewed  BASIC METABOLIC PANEL WITH GFR - Abnormal; Notable for the following components:      Result Value   Glucose, Bld 131 (*)    All other components within normal limits  CBC  TROPONIN I (HIGH SENSITIVITY)  TROPONIN I (HIGH SENSITIVITY)      EKG normal sinus rhythm, no ST segment depressions or elevations, no T wave inversions, no acute ischemia.  EKG Interpretation Date/Time:    Ventricular Rate:    PR Interval:    QRS Duration:    QT Interval:    QTC Calculation:   R Axis:      Text Interpretation:  Imaging Studies ordered: I ordered imaging studies including chest x-ray I independently visualized and interpreted imaging. I agree with the radiologist interpretation   Medicines  ordered and prescription drug management: Meds ordered this encounter  Medications   aspirin  chewable tablet 324 mg    -I have reviewed the patients home medicines and have made adjustments as needed   Cardiac Monitoring: The patient was maintained on a cardiac monitor.  I personally viewed and interpreted the cardiac monitored which showed an underlying rhythm of: Normal sinus rhythm  Social Determinants of Health:  Factors impacting patients care include: Lack of access to primary care   Reevaluation: After the interventions noted above, I reevaluated the patient and found that they have :improved  Co morbidities that complicate the patient evaluation  Past Medical History:  Diagnosis Date   Anginal pain (HCC)    Arthritis    "probably in my hands" (09/04/2013)   Basal cell carcinoma of nostril 05/2008   Cancer (HCC)    Phreesia 05/17/2020   Cervical cancer (HCC) 1986   Coronary atherosclerosis of native coronary artery 09/04/2013   RCA and obtuse marginal stent/DES-2010   Diabetes mellitus without complication (HCC)    Phreesia 05/17/2020   Displaced fracture of distal end of right fibula 10/10/2018   Fibromyalgia    "dx'd in 1994"   Full dentures    GERD (gastroesophageal reflux disease)    Hyperlipidemia    Hypertension    Myocardial infarction (HCC)    Phreesia 05/17/2020   Obesity    Shortness of breath    "just related to angina I was having recently" (09/04/2013)   Type II diabetes mellitus (HCC)    followed by pcp   Unstable angina (HCC) 09/04/2013   Wears glasses       Dispostion: I considered admission for this patient, however through shared decision making with the patient, she preferred discharge with outpatient follow-up.     Final Clinical Impression(s) / ED Diagnoses Final diagnoses:  Angina concurrent with and due to arteriosclerosis of coronary artery (HCC)     @PCDICTATION @    Afton Horse T, DO 03/15/24 0740

## 2024-03-15 NOTE — Discharge Instructions (Addendum)
 While you were in the emergency room, you had blood work done that was normal.  Your EKG does not have any changes compared to your previous EKGs.  Continue taking all medications as prescribed.  Please follow-up with your cardiologist on Monday.  If you begin to experience worsening pain in your chest or difficulty breathing, please return to the emergency department immediately.

## 2024-03-15 NOTE — ED Notes (Signed)
 Called lab to see why there is a delay in Trop results - repeat has been sent already

## 2024-03-15 NOTE — ED Provider Triage Note (Signed)
 Emergency Medicine Provider Triage Evaluation Note  Suzanne Watkins , a 73 y.o. female  was evaluated in triage.  Pt complains of chest tightness and "angina."  States that she is having a dull ache in the chest.  Sees Dr. Swaziland.  Recently had Toprol  increased.  Has been taking nitroglycerin  today, but it isn't helping.  She states that her symptoms started to worsen today.  Has SOB with exertion and at rest.  Hx of MI.   Review of Systems  Positive: Chest tightness Negative: fever  Physical Exam  BP (!) 147/80 (BP Location: Right Arm)   Pulse 85   Temp 98 F (36.7 C)   Resp 16   Wt 70.6 kg   SpO2 100%   BMI 28.47 kg/m  Gen:   Awake, no distress   Resp:  Normal effort  MSK:   Moves extremities without difficulty  Other:    Medical Decision Making  Medically screening exam initiated at 12:23 AM.  Appropriate orders placed.  STEFAN BLEHM was informed that the remainder of the evaluation will be completed by another provider, this initial triage assessment does not replace that evaluation, and the importance of remaining in the ED until their evaluation is complete.     Sherel Dikes, PA-C 03/15/24 [phone]

## 2024-03-18 ENCOUNTER — Other Ambulatory Visit: Payer: Self-pay | Admitting: Cardiology

## 2024-03-18 ENCOUNTER — Ambulatory Visit: Payer: Medicare PPO | Attending: Cardiology | Admitting: Cardiology

## 2024-03-18 ENCOUNTER — Encounter: Payer: Self-pay | Admitting: Cardiology

## 2024-03-18 VITALS — BP 124/60 | HR 67 | Ht 62.0 in | Wt 158.0 lb

## 2024-03-18 DIAGNOSIS — I25110 Atherosclerotic heart disease of native coronary artery with unstable angina pectoris: Secondary | ICD-10-CM

## 2024-03-18 DIAGNOSIS — I10 Essential (primary) hypertension: Secondary | ICD-10-CM

## 2024-03-18 DIAGNOSIS — I200 Unstable angina: Secondary | ICD-10-CM

## 2024-03-18 DIAGNOSIS — E785 Hyperlipidemia, unspecified: Secondary | ICD-10-CM

## 2024-03-18 DIAGNOSIS — E1159 Type 2 diabetes mellitus with other circulatory complications: Secondary | ICD-10-CM

## 2024-03-18 LAB — LIPID PANEL

## 2024-03-18 NOTE — Patient Instructions (Addendum)
 Medication Instructions:  Continue same medications *If you need a refill on your cardiac medications before your next appointment, please call your pharmacy*  Lab Work: Cmet,cbc,lipid panel today  Testing/Procedures: Cardiac Cath scheduled at [facility]  Wed 5/21  Arrive at 8:00 am  Follow instructions below  Follow-Up: At [facility], you and your health needs are our priority.  As part of our continuing mission to provide you with exceptional heart care, our providers are all part of one team.  This team includes your primary Cardiologist (physician) and Advanced Practice Providers or APPs (Physician Assistants and Nurse Practitioners) who all work together to provide you with the care you need, when you need it.  Your next appointment:  After Cath    Thursday 6/5 at 2:20 pm    Provider:  Marlana Silvan NP   We recommend signing up for the patient portal called "MyChart".  Sign up information is provided on this After Visit Summary.  MyChart is used to connect with patients for Virtual Visits (Telemedicine).  Patients are able to view lab/test results, encounter notes, upcoming appointments, etc.  Non-urgent messages can be sent to your provider as well.   To learn more about what you can do with MyChart, go to [email or web address].       [place] [facility] A DEPT OF [place]. [facility] [address] A DEPT OF THE [place]. [facility] [address] [place] [address] Dept: [phone] Loc: [phone]  SHERIL HAMMOND  03/18/2024  You are scheduled for a Cardiac Catheterization on Wednesday, May 21 with Dr. Peter Swaziland.  1. Please arrive at the [facility] (Main Entrance A) at [facility]: [address] [place], [place] [address] at 8:00 AM (This time is 2 hour(s) before your procedure to ensure your preparation).   Free valet parking service is available. You will check in at ADMITTING. The support person will be asked  to wait in the waiting room.  It is OK to have someone drop you off and come back when you are ready to be discharged.    Special note: Every effort is made to have your procedure done on time. Please understand that emergencies sometimes delay scheduled procedures.  2. Diet: Do not eat solid foods after midnight.  The patient may have clear liquids until 5am upon the day of the procedure.  3. Labs: You will need to have blood drawn on Monday 5/19 at Dr.Jordan's office [facility]   4. Medication instructions in preparation for your procedure:   Hold Janumet  morning of cath and Hold 2 days after cath       On the morning of your procedure, take your Aspirin  81 mg and Brilinta /Ticagrelor  and any morning medicines NOT listed above.  You may use sips of water.  5. Plan to go home the same day, you will only stay overnight if medically necessary. 6. Bring a current list of your medications and current insurance cards. 7. You MUST have a responsible person to drive you home. 8. Someone MUST be with you the first 24 hours after you arrive home or your discharge will be delayed. 9. Please wear clothes that are easy to get on and off and wear slip-on shoes.  Thank you for allowing us  to care for you!   -- [place] [facility]

## 2024-03-19 ENCOUNTER — Telehealth: Payer: Self-pay | Admitting: *Deleted

## 2024-03-19 ENCOUNTER — Ambulatory Visit: Payer: Self-pay | Admitting: Cardiology

## 2024-03-19 LAB — CBC WITH DIFFERENTIAL/PLATELET
Basophils Absolute: 0.1 10*3/uL (ref 0.0–0.2)
Basos: 1 %
EOS (ABSOLUTE): 0.2 10*3/uL (ref 0.0–0.4)
Eos: 2 %
Hematocrit: 40.6 % (ref 34.0–46.6)
Hemoglobin: 13.2 g/dL (ref 11.1–15.9)
Immature Grans (Abs): 0 10*3/uL (ref 0.0–0.1)
Immature Granulocytes: 0 %
Lymphocytes Absolute: 1.4 10*3/uL (ref 0.7–3.1)
Lymphs: 18 %
MCH: 30.9 pg (ref 26.6–33.0)
MCHC: 32.5 g/dL (ref 31.5–35.7)
MCV: 95 fL (ref 79–97)
Monocytes Absolute: 0.7 10*3/uL (ref 0.1–0.9)
Monocytes: 9 %
Neutrophils Absolute: 5.7 10*3/uL (ref 1.4–7.0)
Neutrophils: 70 %
Platelets: 237 10*3/uL (ref 150–450)
RBC: 4.27 x10E6/uL (ref 3.77–5.28)
RDW: 12.9 % (ref 11.7–15.4)
WBC: 8.1 10*3/uL (ref 3.4–10.8)

## 2024-03-19 LAB — COMPREHENSIVE METABOLIC PANEL WITH GFR
ALT: 24 IU/L (ref 0–32)
AST: 28 IU/L (ref 0–40)
Albumin: 4.5 g/dL (ref 3.9–4.9)
Alkaline Phosphatase: 74 IU/L (ref 44–121)
BUN/Creatinine Ratio: 18 (ref 12–28)
BUN: 16 mg/dL (ref 8–27)
Bilirubin Total: 0.6 mg/dL (ref 0.0–1.2)
CO2: 17 mmol/L — ABNORMAL LOW (ref 20–29)
Calcium: 10.2 mg/dL (ref 8.7–10.3)
Chloride: 102 mmol/L (ref 96–106)
Creatinine, Ser: 0.91 mg/dL (ref 0.57–1.00)
Globulin, Total: 2 g/dL (ref 1.5–4.5)
Glucose: 124 mg/dL — ABNORMAL HIGH (ref 70–99)
Potassium: 5.3 mmol/L — ABNORMAL HIGH (ref 3.5–5.2)
Sodium: 139 mmol/L (ref 134–144)
Total Protein: 6.5 g/dL (ref 6.0–8.5)
eGFR: 68 mL/min/{1.73_m2} (ref >59)

## 2024-03-19 LAB — LIPID PANEL
Chol/HDL Ratio: 3.3 ratio (ref 0.0–4.4)
Cholesterol, Total: 120 mg/dL (ref 100–199)
HDL: 36 mg/dL — ABNORMAL LOW (ref >39)
LDL Chol Calc (NIH): 59 mg/dL (ref 0–99)
Triglycerides: 141 mg/dL (ref 0–149)
VLDL Cholesterol Cal: 25 mg/dL (ref 5–40)

## 2024-03-19 NOTE — Telephone Encounter (Signed)
 Cardiac Catheterization scheduled at [facility] for: Wednesday Mar 20, 2024 10 AM Arrival time [facility] Main Entrance A at: 8 AM  Nothing to eat after midnight prior to procedure, clear liquids until 5 AM day of procedure.  Medication instructions: -Hold:  Janumet -day of procedure and 48 hours post procedure -Other usual morning medications can be taken with sips of water including aspirin  81 mg and Brilinta  60 mg.  Plan to go home the same day, you will only stay overnight if medically necessary.  You must have responsible adult to drive you home.  Someone must be with you the first 24 hours after you arrive home.  Reviewed procedure instructions with patient.

## 2024-03-20 ENCOUNTER — Encounter (HOSPITAL_COMMUNITY): Payer: Self-pay | Admitting: Cardiology

## 2024-03-20 ENCOUNTER — Other Ambulatory Visit (HOSPITAL_COMMUNITY): Payer: Self-pay

## 2024-03-20 ENCOUNTER — Other Ambulatory Visit: Payer: Self-pay

## 2024-03-20 ENCOUNTER — Ambulatory Visit (HOSPITAL_COMMUNITY)
Admission: RE | Disposition: A | Discharge status: Home / Self Care | Payer: Self-pay | Source: Home / Self Care | Attending: Cardiology

## 2024-03-20 ENCOUNTER — Ambulatory Visit (HOSPITAL_COMMUNITY)
Admission: RE | Admit: 2024-03-20 | Discharge: 2024-03-21 | Disposition: A | Discharge status: Home / Self Care | Attending: Cardiology | Admitting: Cardiology

## 2024-03-20 DIAGNOSIS — I2510 Atherosclerotic heart disease of native coronary artery without angina pectoris: Secondary | ICD-10-CM | POA: Diagnosis present

## 2024-03-20 DIAGNOSIS — I252 Old myocardial infarction: Secondary | ICD-10-CM | POA: Diagnosis not present

## 2024-03-20 DIAGNOSIS — Z7984 Long term (current) use of oral hypoglycemic drugs: Secondary | ICD-10-CM | POA: Diagnosis not present

## 2024-03-20 DIAGNOSIS — Z955 Presence of coronary angioplasty implant and graft: Secondary | ICD-10-CM | POA: Insufficient documentation

## 2024-03-20 DIAGNOSIS — Z9861 Coronary angioplasty status: Secondary | ICD-10-CM

## 2024-03-20 DIAGNOSIS — Z87891 Personal history of nicotine dependence: Secondary | ICD-10-CM | POA: Diagnosis not present

## 2024-03-20 DIAGNOSIS — E1159 Type 2 diabetes mellitus with other circulatory complications: Secondary | ICD-10-CM | POA: Diagnosis not present

## 2024-03-20 DIAGNOSIS — Z79899 Other long term (current) drug therapy: Secondary | ICD-10-CM | POA: Insufficient documentation

## 2024-03-20 DIAGNOSIS — E785 Hyperlipidemia, unspecified: Secondary | ICD-10-CM | POA: Diagnosis not present

## 2024-03-20 DIAGNOSIS — I200 Unstable angina: Secondary | ICD-10-CM

## 2024-03-20 DIAGNOSIS — I119 Hypertensive heart disease without heart failure: Secondary | ICD-10-CM | POA: Insufficient documentation

## 2024-03-20 DIAGNOSIS — I25118 Atherosclerotic heart disease of native coronary artery with other forms of angina pectoris: Secondary | ICD-10-CM | POA: Diagnosis not present

## 2024-03-20 DIAGNOSIS — E119 Type 2 diabetes mellitus without complications: Secondary | ICD-10-CM

## 2024-03-20 DIAGNOSIS — I10 Essential (primary) hypertension: Secondary | ICD-10-CM | POA: Diagnosis present

## 2024-03-20 DIAGNOSIS — Z7902 Long term (current) use of antithrombotics/antiplatelets: Secondary | ICD-10-CM | POA: Diagnosis not present

## 2024-03-20 HISTORY — PX: CORONARY BALLOON ANGIOPLASTY: CATH118233

## 2024-03-20 HISTORY — PX: CORONARY LITHOTRIPSY: CATH118330

## 2024-03-20 HISTORY — PX: LEFT HEART CATH AND CORONARY ANGIOGRAPHY: CATH118249

## 2024-03-20 LAB — POCT ACTIVATED CLOTTING TIME
Activated Clotting Time: 406 s
Activated Clotting Time: 516 s
Activated Clotting Time: 199 s
Activated Clotting Time: 176 s

## 2024-03-20 LAB — GLUCOSE, CAPILLARY
Glucose-Capillary: 152 mg/dL — ABNORMAL HIGH (ref 70–99)
Glucose-Capillary: 113 mg/dL — ABNORMAL HIGH (ref 70–99)
Glucose-Capillary: 115 mg/dL — ABNORMAL HIGH (ref 70–99)

## 2024-03-20 MED ORDER — SODIUM CHLORIDE 0.9% FLUSH
3.0000 mL | Freq: Two times a day (BID) | INTRAVENOUS | Status: DC
  Administered 2024-03-20 – 2024-03-21 (×2): 3 mL via INTRAVENOUS

## 2024-03-20 MED ORDER — METOPROLOL SUCCINATE ER 25 MG PO TB24
37.5000 mg | ORAL_TABLET | Freq: Every day | ORAL | Status: DC
  Administered 2024-03-21: 37.5 mg via ORAL
  Filled 2024-03-20: qty 2

## 2024-03-20 MED ORDER — MIDAZOLAM HCL 2 MG/2ML IJ SOLN
INTRAMUSCULAR | Status: AC
  Filled 2024-03-20: qty 2

## 2024-03-20 MED ORDER — ORAL CARE MOUTH RINSE: 15.0000 mL | OROMUCOSAL | Status: DC | PRN

## 2024-03-20 MED ORDER — ACETAMINOPHEN 325 MG PO TABS: 650.0000 mg | ORAL_TABLET | ORAL | Status: DC | PRN

## 2024-03-20 MED ORDER — LIDOCAINE HCL (PF) 1 % IJ SOLN
INTRAMUSCULAR | Status: AC
  Filled 2024-03-20: qty 30

## 2024-03-20 MED ORDER — HYDRALAZINE HCL 20 MG/ML IJ SOLN: 10.0000 mg | INTRAMUSCULAR | Status: AC | PRN

## 2024-03-20 MED ORDER — AMLODIPINE BESYLATE 5 MG PO TABS
5.0000 mg | ORAL_TABLET | Freq: Two times a day (BID) | ORAL | Status: DC
  Administered 2024-03-20 – 2024-03-21 (×2): 5 mg via ORAL
  Filled 2024-03-20 (×2): qty 1

## 2024-03-20 MED ORDER — FENTANYL CITRATE (PF) 100 MCG/2ML IJ SOLN
INTRAMUSCULAR | Status: AC
Start: 2024-03-20 — End: ?
  Filled 2024-03-20: qty 2

## 2024-03-20 MED ORDER — LIDOCAINE HCL (PF) 1 % IJ SOLN
INTRAMUSCULAR | Status: DC | PRN
  Administered 2024-03-20: 2 mL

## 2024-03-20 MED ORDER — SODIUM CHLORIDE 0.9% FLUSH: 3.0000 mL | INTRAVENOUS | Status: DC | PRN

## 2024-03-20 MED ORDER — MIDAZOLAM HCL 2 MG/2ML IJ SOLN
INTRAMUSCULAR | Status: AC
Start: 2024-03-20 — End: ?
  Filled 2024-03-20: qty 2

## 2024-03-20 MED ORDER — MIDAZOLAM HCL 2 MG/2ML IJ SOLN
INTRAMUSCULAR | Status: DC | PRN
  Administered 2024-03-20: 1 mg via INTRAVENOUS
  Administered 2024-03-20: 2 mg via INTRAVENOUS

## 2024-03-20 MED ORDER — PANTOPRAZOLE SODIUM 40 MG PO TBEC
40.0000 mg | DELAYED_RELEASE_TABLET | Freq: Every day | ORAL | Status: DC
  Administered 2024-03-21: 40 mg via ORAL
  Filled 2024-03-20: qty 1

## 2024-03-20 MED ORDER — ATORVASTATIN CALCIUM 80 MG PO TABS
80.0000 mg | ORAL_TABLET | Freq: Every day | ORAL | Status: DC
  Administered 2024-03-20: 80 mg via ORAL
  Filled 2024-03-20: qty 1

## 2024-03-20 MED ORDER — NITROGLYCERIN 0.4 MG SL SUBL
0.4000 mg | SUBLINGUAL_TABLET | SUBLINGUAL | 3 refills | Status: AC | PRN
  Filled 2024-03-20: qty 25, 7d supply, fill #0

## 2024-03-20 MED ORDER — NITROGLYCERIN 1 MG/10 ML FOR IR/CATH LAB
INTRA_ARTERIAL | Status: AC
  Filled 2024-03-20: qty 10

## 2024-03-20 MED ORDER — LABETALOL HCL 5 MG/ML IV SOLN: 10.0000 mg | INTRAVENOUS | Status: AC | PRN

## 2024-03-20 MED ORDER — GABAPENTIN 100 MG PO CAPS
100.0000 mg | ORAL_CAPSULE | Freq: Every day | ORAL | Status: DC
  Administered 2024-03-20: 100 mg via ORAL
  Filled 2024-03-20: qty 1

## 2024-03-20 MED ORDER — FENTANYL CITRATE (PF) 100 MCG/2ML IJ SOLN
INTRAMUSCULAR | Status: DC | PRN
  Administered 2024-03-20: 50 ug via INTRAVENOUS
  Administered 2024-03-20 (×2): 25 ug via INTRAVENOUS

## 2024-03-20 MED ORDER — TICAGRELOR 90 MG PO TABS
90.0000 mg | ORAL_TABLET | Freq: Two times a day (BID) | ORAL | Status: DC
  Administered 2024-03-20 – 2024-03-21 (×2): 90 mg via ORAL
  Filled 2024-03-20 (×2): qty 1

## 2024-03-20 MED ORDER — HEPARIN SODIUM (PORCINE) 1000 UNIT/ML IJ SOLN
INTRAMUSCULAR | Status: DC | PRN
  Administered 2024-03-20: 7000 [IU] via INTRAVENOUS

## 2024-03-20 MED ORDER — SODIUM CHLORIDE 0.9% FLUSH: 3.0000 mL | Freq: Two times a day (BID) | INTRAVENOUS | Status: DC

## 2024-03-20 MED ORDER — HEPARIN SODIUM (PORCINE) 1000 UNIT/ML IJ SOLN
INTRAMUSCULAR | Status: AC
  Filled 2024-03-20: qty 10

## 2024-03-20 MED ORDER — ISOSORBIDE MONONITRATE ER 60 MG PO TB24
90.0000 mg | ORAL_TABLET | Freq: Every day | ORAL | Status: DC
  Administered 2024-03-21: 90 mg via ORAL
  Filled 2024-03-20: qty 1

## 2024-03-20 MED ORDER — EZETIMIBE 10 MG PO TABS
10.0000 mg | ORAL_TABLET | Freq: Every day | ORAL | Status: DC
  Administered 2024-03-21: 10 mg via ORAL
  Filled 2024-03-20: qty 1

## 2024-03-20 MED ORDER — ASPIRIN 81 MG PO CHEW: 81.0000 mg | CHEWABLE_TABLET | ORAL | Status: DC

## 2024-03-20 MED ORDER — TICAGRELOR 90 MG PO TABS
90.0000 mg | ORAL_TABLET | Freq: Two times a day (BID) | ORAL | 1 refills | Status: DC
  Filled 2024-03-20 – 2024-04-15 (×2): qty 60, 30d supply, fill #0
  Filled 2024-05-17: qty 60, 30d supply, fill #1

## 2024-03-20 MED ORDER — ONDANSETRON HCL 4 MG/2ML IJ SOLN: 4.0000 mg | Freq: Four times a day (QID) | INTRAMUSCULAR | Status: DC | PRN

## 2024-03-20 MED ORDER — ADULT MULTIVITAMIN W/MINERALS CH
1.0000 | ORAL_TABLET | Freq: Every day | ORAL | Status: DC
  Administered 2024-03-20: 1 via ORAL
  Filled 2024-03-20: qty 1

## 2024-03-20 MED ORDER — ASPIRIN 81 MG PO CHEW
81.0000 mg | CHEWABLE_TABLET | Freq: Every day | ORAL | Status: DC
  Administered 2024-03-21: 81 mg via ORAL
  Filled 2024-03-20: qty 1

## 2024-03-20 MED ORDER — IOHEXOL 350 MG/ML SOLN
INTRAVENOUS | Status: DC | PRN
  Administered 2024-03-20: 130 mL via INTRA_ARTERIAL

## 2024-03-20 MED ORDER — SODIUM CHLORIDE 0.9 % IV SOLN: 250.0000 mL | INTRAVENOUS | Status: DC | PRN

## 2024-03-20 MED ORDER — NITROGLYCERIN 0.4 MG SL SUBL: 0.4000 mg | SUBLINGUAL_TABLET | SUBLINGUAL | Status: DC | PRN

## 2024-03-20 MED ORDER — SODIUM CHLORIDE 0.9 % IV SOLN: 250.0000 mL | INTRAVENOUS | Status: AC | PRN

## 2024-03-20 MED ORDER — SODIUM CHLORIDE 0.9 % IV SOLN
INTRAVENOUS | Status: DC
Start: 2024-03-20 — End: 2024-03-20

## 2024-03-20 NOTE — Progress Notes (Signed)
 Bedrest begins at 1455hrs. Site level remains 0; gauze dressing with tegaderm applied. Clean dry intact

## 2024-03-20 NOTE — Progress Notes (Signed)
 6 Fr sheath pulled from right femoral artery. Manual pressure held for 30 min. Vital signs monitored and remained stable. Site level 0.

## 2024-03-20 NOTE — Progress Notes (Signed)
    Initially planned for same day PCI, but given late sheath pull with needed bedrest will keep overnight for observation. Orders placed.   Braulio Calender, NP-C 03/20/2024, 3:48 PM Pager: [phone]

## 2024-03-20 NOTE — Interval H&P Note (Signed)
 History and Physical Interval Note:  03/20/2024 9:56 AM  Suzanne Watkins  has presented today for surgery, with the diagnosis of chest pain.  The various methods of treatment have been discussed with the patient and family. After consideration of risks, benefits and other options for treatment, the patient has consented to  Procedure(s): LEFT HEART CATH AND CORONARY ANGIOGRAPHY (N/A) as a surgical intervention.  The patient's history has been reviewed, patient examined, no change in status, stable for surgery.  I have reviewed the patient's chart and labs.  Questions were answered to the patient's satisfaction.    Cath Lab Visit (complete for each Cath Lab visit)  Clinical Evaluation Leading to the Procedure:   ACS: No.  Non-ACS:    Anginal Classification: CCS II  Anti-ischemic medical therapy: Maximal Therapy (2 or more classes of medications)  Non-Invasive Test Results: No non-invasive testing performed  Prior CABG: No previous CABG       Donata Fryer [facility] 03/20/2024 9:57 AM

## 2024-03-20 NOTE — Progress Notes (Signed)
 CARDIAC REHAB PHASE I     Post PTCA education including site care, restrictions, risk factors, exercise guidelines, NTG use, antiplatelet therapy importance, heart healthy diabetic diet and CRP2 reviewed. All questions and concerns addressed. Will refer to [facility] for CRP2. Plan for home later today.    [id number] Ronny Colas, RN BSN 03/20/2024 2:30 PM

## 2024-03-21 ENCOUNTER — Encounter: Payer: Self-pay | Admitting: Cardiology

## 2024-03-21 ENCOUNTER — Other Ambulatory Visit (HOSPITAL_COMMUNITY): Payer: Self-pay

## 2024-03-21 DIAGNOSIS — E1159 Type 2 diabetes mellitus with other circulatory complications: Secondary | ICD-10-CM | POA: Diagnosis not present

## 2024-03-21 DIAGNOSIS — I25118 Atherosclerotic heart disease of native coronary artery with other forms of angina pectoris: Secondary | ICD-10-CM

## 2024-03-21 DIAGNOSIS — I119 Hypertensive heart disease without heart failure: Secondary | ICD-10-CM | POA: Diagnosis not present

## 2024-03-21 DIAGNOSIS — Z955 Presence of coronary angioplasty implant and graft: Secondary | ICD-10-CM | POA: Diagnosis not present

## 2024-03-21 DIAGNOSIS — I252 Old myocardial infarction: Secondary | ICD-10-CM | POA: Diagnosis not present

## 2024-03-21 DIAGNOSIS — Z87891 Personal history of nicotine dependence: Secondary | ICD-10-CM | POA: Diagnosis not present

## 2024-03-21 DIAGNOSIS — E785 Hyperlipidemia, unspecified: Secondary | ICD-10-CM | POA: Diagnosis not present

## 2024-03-21 DIAGNOSIS — Z7902 Long term (current) use of antithrombotics/antiplatelets: Secondary | ICD-10-CM | POA: Diagnosis not present

## 2024-03-21 DIAGNOSIS — Z7984 Long term (current) use of oral hypoglycemic drugs: Secondary | ICD-10-CM | POA: Diagnosis not present

## 2024-03-21 LAB — BASIC METABOLIC PANEL WITH GFR
Anion gap: 7 (ref 5–15)
BUN: 15 mg/dL (ref 8–23)
CO2: 24 mmol/L (ref 22–32)
Calcium: 9.6 mg/dL (ref 8.9–10.3)
Chloride: 107 mmol/L (ref 98–111)
Creatinine, Ser: 0.78 mg/dL (ref 0.44–1.00)
GFR, Estimated: >60 mL/min (ref >60)
Glucose, Bld: 116 mg/dL — ABNORMAL HIGH (ref 70–99)
Potassium: 4.5 mmol/L (ref 3.5–5.1)
Sodium: 138 mmol/L (ref 135–145)

## 2024-03-21 LAB — CBC
HCT: 35.4 % — ABNORMAL LOW (ref 36.0–46.0)
Hemoglobin: 12.2 g/dL (ref 12.0–15.0)
MCH: 31.9 pg (ref 26.0–34.0)
MCHC: 34.5 g/dL (ref 30.0–36.0)
MCV: 92.4 fL (ref 80.0–100.0)
Platelets: 195 10*3/uL (ref 150–400)
RBC: 3.83 MIL/uL — ABNORMAL LOW (ref 3.87–5.11)
RDW: 13.1 % (ref 11.5–15.5)
WBC: 8.6 10*3/uL (ref 4.0–10.5)
nRBC: 0 % (ref 0.0–0.2)

## 2024-03-21 LAB — GLUCOSE, CAPILLARY: Glucose-Capillary: 222 mg/dL — ABNORMAL HIGH (ref 70–99)

## 2024-03-21 MED ORDER — ISOSORBIDE MONONITRATE ER 30 MG PO TB24
90.0000 mg | ORAL_TABLET | Freq: Every day | ORAL | 3 refills | Status: DC
  Filled 2024-03-21: qty 270, 90d supply, fill #0

## 2024-03-21 NOTE — Progress Notes (Signed)
 CARDIAC REHAB PHASE I    Post stent education completed 03-20-24. All questions and concerns addressed. Referral sent to [facility] for CRP2. Plan for discharge home today.   Ronny Colas, RN BSN 03/21/2024 11:32 AM

## 2024-03-21 NOTE — Plan of Care (Signed)

## 2024-03-21 NOTE — Progress Notes (Addendum)
 Rounding Note    Patient Name: Suzanne Watkins Date of Encounter: 03/21/2024  [facility] Cardiologist: Peter Swaziland, MD   Subjective   Denies any CP or SOB. No pain in the R groin area cath site  Inpatient Medications    Scheduled Meds:  amLODipine   5 mg Oral BID   aspirin   81 mg Oral Daily   atorvastatin   80 mg Oral QHS   ezetimibe   10 mg Oral Daily   gabapentin   100 mg Oral QHS   isosorbide  mononitrate  90 mg Oral Daily   metoprolol  succinate  37.5 mg Oral Daily   multivitamin with minerals  1 tablet Oral QHS   pantoprazole   40 mg Oral Daily   sodium chloride  flush  3 mL Intravenous Q12H   ticagrelor   90 mg Oral BID   Continuous Infusions:  sodium chloride      PRN Meds: sodium chloride , acetaminophen , nitroGLYCERIN , ondansetron  (ZOFRAN ) IV, mouth rinse, sodium chloride  flush   Vital Signs    Vitals:   03/20/24 1951 03/21/24 0039 03/21/24 0042 03/21/24 0437  BP: (!) 118/54  (!) 109/52 126/67  Pulse: 64  77 69  Resp: 18  18 16   Temp: 98 F (36.7 C) 97.9 F (36.6 C) 97.9 F (36.6 C) 98.4 F (36.9 C)  TempSrc: Oral  Oral Oral  SpO2: 97%  96% 96%  Weight:      Height:        Intake/Output Summary (Last 24 hours) at 03/21/2024 0646 Last data filed at 03/20/2024 2000 Gross per 24 hour  Intake 360 ml  Output --  Net 360 ml      03/20/2024    8:29 AM 03/18/2024   10:11 AM 03/14/2024   11:35 PM  Last 3 Weights  Weight (lbs) 157 lb 158 lb 155 lb 10.3 oz  Weight (kg) 71.215 kg 71.668 kg 70.6 kg      Telemetry    NSR without significant ventricular ectopy - Personally Reviewed  ECG    NSR with RBBB - Personally Reviewed  Physical Exam   GEN: No acute distress.   Neck: No JVD Cardiac: RRR, no murmurs, rubs, or gallops.  Respiratory: Clear to auscultation bilaterally. GI: Soft, nontender, non-distended  MS: No edema; No deformity. Neuro:  Nonfocal  Psych: Normal affect   Labs    High Sensitivity Troponin:   Recent Labs  Lab  03/15/24 0006 03/15/24 0211  TROPONINIHS 3 3     Chemistry Recent Labs  Lab 03/15/24 0006 03/18/24 1133 03/21/24 0406  NA 138 139 138  K 4.2 5.3* 4.5  CL 103 102 107  CO2 24 17* 24  GLUCOSE 131* 124* 116*  BUN 20 16 15   CREATININE 0.97 0.91 0.78  CALCIUM  9.7 10.2 9.6  PROT  --  6.5  --   ALBUMIN  --  4.5  --   AST  --  28  --   ALT  --  24  --   ALKPHOS  --  74  --   BILITOT  --  0.6  --   GFRNONAA >60  --  >60  ANIONGAP 11  --  7    Lipids  Recent Labs  Lab 03/18/24 1133  CHOL 120  TRIG 141  HDL 36*  LABVLDL 25  LDLCALC 59  CHOLHDL 3.3    Hematology Recent Labs  Lab 03/14/24 2348 03/18/24 1133 03/21/24 0406  WBC 7.3 8.1 8.6  RBC 4.17 4.27 3.83*  HGB 13.0  13.2 12.2  HCT 38.9 40.6 35.4*  MCV 93.3 95 92.4  MCH 31.2 30.9 31.9  MCHC 33.4 32.5 34.5  RDW 13.1 12.9 13.1  PLT 224 237 195   Thyroid No results for input(s): "TSH", "FREET4" in the last 168 hours.  BNPNo results for input(s): "BNP", "PROBNP" in the last 168 hours.  DDimer No results for input(s): "DDIMER" in the last 168 hours.   Radiology    CARDIAC CATHETERIZATION Result Date: 03/20/2024   RPDA lesion is 50% stenosed.   2nd RPL lesion is 100% stenosed.   1st Mrg-1 lesion is 90% stenosed.   1st Mrg-2 lesion is 80% stenosed.   Non-stenotic Mid LAD lesion was previously treated.   Non-stenotic Prox RCA lesion was previously treated.   Non-stenotic Mid RCA to Dist RCA lesion was previously treated.   Drug-coated balloon angioplasty was performed using a BALL DC AGENT 3.0X20.   Drug-coated balloon angioplasty was performed using a BALLOON SCOREFLEX 2.50X15.   Post intervention, there is a 0% residual stenosis.   Post intervention, there is a 0% residual stenosis.   The left ventricular systolic function is normal.   LV end diastolic pressure is normal.   The left ventricular ejection fraction is 55-65% by visual estimate.   Recommend uninterrupted dual antiplatelet therapy with Aspirin  81mg  daily and  Ticagrelor  90mg  twice daily for a minimum of 6 months (stable ischemic heart disease-Class I recommendation). 2 vessel obstructive CAD involving instent restenosis in 2 areas in the first OM. Stents in the LAD and throughout the RCA are patent. 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 Normal LV function Normal LVEDP Successful PCI of the first OM with Scoring balloon, ICL Shockwave therapy, high pressure noncompliant balloon followed by Drug coated balloon therapy Plan: DAPT with Brilinta  90 mg bid, ASA 81 mg daily for at least 6 months. Then long term therapy with Brilinta  60 mg bid. Anticipate same day DC    Cardiac Studies   Cath 03/20/2024   RPDA lesion is 50% stenosed.   2nd RPL lesion is 100% stenosed.   1st Mrg-1 lesion is 90% stenosed.   1st Mrg-2 lesion is 80% stenosed.   Non-stenotic Mid LAD lesion was previously treated.   Non-stenotic Prox RCA lesion was previously treated.   Non-stenotic Mid RCA to Dist RCA lesion was previously treated.   Drug-coated balloon angioplasty was performed using a BALL DC AGENT 3.0X20.   Drug-coated balloon angioplasty was performed using a BALLOON SCOREFLEX 2.50X15.   Post intervention, there is a 0% residual stenosis.   Post intervention, there is a 0% residual stenosis.   The left ventricular systolic function is normal.   LV end diastolic pressure is normal.   The left ventricular ejection fraction is 55-65% by visual estimate.   Recommend uninterrupted dual antiplatelet therapy with Aspirin  81mg  daily and Ticagrelor  90mg  twice daily for a minimum of 6 months (stable ischemic heart disease-Class I recommendation).   2 vessel obstructive CAD involving instent restenosis in 2 areas in the first OM. Stents in the LAD and throughout the RCA are patent. 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  Normal LV function Normal LVEDP Successful PCI of the first OM with Scoring balloon, ICL Shockwave therapy, high pressure noncompliant balloon  followed by Drug coated balloon therapy   Plan: DAPT with Brilinta  90 mg bid, ASA 81 mg daily for at least 6 months. Then long term therapy with Brilinta  60 mg bid. Anticipate same day DC  Patient Profile     73 y.o. female with PMH of CAD, HTN, HLD and DM II who was recently seen in the office for progressive angina and set up for outpatient cath.  Assessment & Plan    CAD with progressive angina - Cath 03/20/2024 showed 50% RPDA, 80-90% in-stent restenosis of OM1 treated with balloon angioplasty, patent mid LAD and mid to distal RCA stent, occluded PL2 with L to R collaterals, EF 55-65% - ASA and Brilinta  90mg  BID for 6 month then long term therapy with Brilinta  60mg  BID for maintenance.  - stable for DC today  Hypertension: BP controlled, SBP borderline low yesterday afternoon, improved this morning. If low in the future, may cut back amlodipine . Will continue current therapy for now.   Hyperlipidemia: on lipitor   DM II  For questions or updates, please contact [facility] Please consult [email or web address] for contact info under        Signed, Ervin Heath, PA  03/21/2024, 6:46 AM     Patient seen, examined. Available data reviewed. Agree with findings, assessment, and plan as outlined by Ervin Heath, PA.  Patient is independently interviewed and examined.  She is alert, oriented, no distress.  HEENT is normal, lungs clear bilaterally, heart is regular rate rhythm no murmur gallop, abdomen soft nontender, right groin site is clear with no hematoma or ecchymosis, lower extremities have no edema.  I reviewed her cardiac catheterization films from yesterday and she underwent successful PCI of severe in-stent restenosis in the obtuse marginal branch of the circumflex.  Patient treated with high-pressure balloon angioplasty followed by drug-eluting balloon angioplasty.  She will be treated with aspirin  and ticagrelor  for 6 months at the 90 mg twice daily dosing of ticagrelor , then will  resume her previous 60 mg twice daily ticagrelor  dosing.  Otherwise, we will continue her current medical program which includes a high intensity statin drug (atorvastatin  80 mg), ezetimibe , isosorbide , metoprolol  succinate, and amlodipine .  Patient medically stable for discharge today.  Post PCI instructions/restrictions reviewed with the patient.  Arnoldo Lapping, M.D. 03/21/2024 9:50 AM

## 2024-03-21 NOTE — Plan of Care (Signed)
  Problem: Activity: Goal: Ability to return to baseline activity level will improve Outcome: Progressing   Problem: Cardiovascular: Goal: Ability to achieve and maintain adequate cardiovascular perfusion will improve Outcome: Progressing Goal: Vascular access site(s) Level 0-1 will be maintained Outcome: Progressing   Problem: Health Behavior/Discharge Planning: Goal: Ability to manage health-related needs will improve Outcome: Progressing   Problem: Clinical Measurements: Goal: Will remain free from infection Outcome: Progressing Goal: Respiratory complications will improve Outcome: Progressing Goal: Cardiovascular complication will be avoided Outcome: Progressing   Problem: Activity: Goal: Risk for activity intolerance will decrease Outcome: Progressing   Problem: Coping: Goal: Level of anxiety will decrease Outcome: Progressing   Problem: Elimination: Goal: Will not experience complications related to urinary retention Outcome: Progressing   Problem: Safety: Goal: Ability to remain free from injury will improve Outcome: Progressing

## 2024-03-21 NOTE — Discharge Summary (Addendum)
 Discharge Summary    Patient ID: Suzanne Watkins MRN: [id number]; DOB: Jan 23, 1951  Admit date: 03/20/2024 Discharge date: 03/21/2024  PCP:  de [place], Raymond J, MD   [facility] Providers Cardiologist:  Peter Swaziland, MD        Discharge Diagnoses    Principal Problem:   CAD (coronary artery disease) Active Problems:   Diabetes [facility])   Hyperlipidemia   Essential hypertension    Diagnostic Studies/Procedures    Cath 03/20/2024   RPDA lesion is 50% stenosed.   2nd RPL lesion is 100% stenosed.   1st Mrg-1 lesion is 90% stenosed.   1st Mrg-2 lesion is 80% stenosed.   Non-stenotic Mid LAD lesion was previously treated.   Non-stenotic Prox RCA lesion was previously treated.   Non-stenotic Mid RCA to Dist RCA lesion was previously treated.   Drug-coated balloon angioplasty was performed using a BALL DC AGENT 3.0X20.   Drug-coated balloon angioplasty was performed using a BALLOON SCOREFLEX 2.50X15.   Post intervention, there is a 0% residual stenosis.   Post intervention, there is a 0% residual stenosis.   The left ventricular systolic function is normal.   LV end diastolic pressure is normal.   The left ventricular ejection fraction is 55-65% by visual estimate.   Recommend uninterrupted dual antiplatelet therapy with Aspirin  81mg  daily and Ticagrelor  90mg  twice daily for a minimum of 6 months (stable ischemic heart disease-Class I recommendation).   2 vessel obstructive CAD involving instent restenosis in 2 areas in the first OM. Stents in the LAD and throughout the RCA are patent. 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  Normal LV function Normal LVEDP Successful PCI of the first OM with Scoring balloon, ICL Shockwave therapy, high pressure noncompliant balloon followed by Drug coated balloon therapy   Plan: DAPT with Brilinta  90 mg bid, ASA 81 mg daily for at least 6 months. Then long term therapy with Brilinta  60 mg bid. Anticipate same day DC    _____________   History of Present Illness     Suzanne Watkins is a 73 y.o. female with CAD, HTN, HLD and DM II.  She is a former patient of Dr Jacquelynn Matter. She has a history of DM, HLD, HTN. She has had multiple stents in the past beginning in 2009 and last in Sept 2022. At that time she had Scoring balloon angioplasty for in stent restenosis of the OM. Noted severe calcification and noted that if stenosis recurred would consider Shockwave therapy. There was also some discussion about drug coated balloon when available.    On follow up today she notes that a couple of months ago she was able to walk 20 minutes in her house without significant angina. Since then she notes she can only walk 3 minutes before having to stop due to chest pain/pressure. Discomfort with rest then she can continue. Was seen in ED 3 days ago. Ecg and troponins were negative.    Hospital Course     Consultants: N/A   Patient presented to outpatient cardiac catheterization on 5/20 11/2023, cardiac catheterization revealed 50% RPDA, 80-90% in-stent restenosis of OM1 treated with balloon angioplasty, patent mid LAD and mid to distal RCA stent, occluded PL2 with L to R collaterals, EF 55-65%.  Post cath, patient's Brilinta  was increased to 90 mg twice a day.  The plan is to continue aspirin  and Brilinta  90 mg twice a day for 6 months and long-term therapy with Brilinta  60 mg twice a day  for maintenance afterward.  Patient was seen in the morning of 03/22/2024 at which time she was doing well without any recurrent chest pain or shortness of breath.  Right femoral cath site are stable on exam with no bleeding or hematoma.  Patient is stable for discharge from the cardiac perspective.  Patient follow-up has been scheduled for 6/5.      Did the patient have an acute coronary syndrome (MI, NSTEMI, STEMI, etc) this admission?:  No                               Did the patient have a percutaneous coronary intervention (stent /  angioplasty)?:  Yes.     Cath/PCI Registry Performance & Quality Measures: Aspirin  prescribed? - Yes ADP Receptor Inhibitor (Plavix/Clopidogrel, Brilinta /Ticagrelor  or Effient/Prasugrel) prescribed (includes medically managed patients)? - Yes High Intensity Statin (Lipitor  40-80mg  or Crestor 20-40mg ) prescribed? - Yes For EF <40%, was ACEI/ARB prescribed? - Not Applicable (EF >/= 40%) For EF <40%, Aldosterone Antagonist (Spironolactone or Eplerenone) prescribed? - Not Applicable (EF >/= 40%) Cardiac Rehab Phase II ordered? - Yes         _____________  Discharge Vitals Blood pressure 110/73, pulse (!) 53, temperature 97.6 F (36.4 C), temperature source Oral, resp. rate 18, height 5\' 2"  (1.575 m), weight 71.2 kg, SpO2 96%.  Filed Weights   03/20/24 0829  Weight: 71.2 kg    Labs & Radiologic Studies    CBC Recent Labs    03/18/24 1133 03/21/24 0406  WBC 8.1 8.6  NEUTROABS 5.7  --   HGB 13.2 12.2  HCT 40.6 35.4*  MCV 95 92.4  PLT 237 195   Basic Metabolic Panel Recent Labs    16/10/96 1133 03/21/24 0406  NA 139 138  K 5.3* 4.5  CL 102 107  CO2 17* 24  GLUCOSE 124* 116*  BUN 16 15  CREATININE 0.91 0.78  CALCIUM  10.2 9.6   Liver Function Tests Recent Labs    03/18/24 1133  AST 28  ALT 24  ALKPHOS 74  BILITOT 0.6  PROT 6.5  ALBUMIN 4.5   No results for input(s): "LIPASE", "AMYLASE" in the last 72 hours. High Sensitivity Troponin:   Recent Labs  Lab 03/15/24 0006 03/15/24 0211  TROPONINIHS 3 3    BNP Invalid input(s): "POCBNP" D-Dimer No results for input(s): "DDIMER" in the last 72 hours. Hemoglobin A1C No results for input(s): "HGBA1C" in the last 72 hours. Fasting Lipid Panel Recent Labs    03/18/24 1133  CHOL 120  HDL 36*  LDLCALC 59  TRIG 045  CHOLHDL 3.3   Thyroid Function Tests No results for input(s): "TSH", "T4TOTAL", "T3FREE", "THYROIDAB" in the last 72 hours.  Invalid input(s): "FREET3" _____________  CARDIAC  CATHETERIZATION Result Date: 03/20/2024   RPDA lesion is 50% stenosed.   2nd RPL lesion is 100% stenosed.   1st Mrg-1 lesion is 90% stenosed.   1st Mrg-2 lesion is 80% stenosed.   Non-stenotic Mid LAD lesion was previously treated.   Non-stenotic Prox RCA lesion was previously treated.   Non-stenotic Mid RCA to Dist RCA lesion was previously treated.   Drug-coated balloon angioplasty was performed using a BALL DC AGENT 3.0X20.   Drug-coated balloon angioplasty was performed using a BALLOON SCOREFLEX 2.50X15.   Post intervention, there is a 0% residual stenosis.   Post intervention, there is a 0% residual stenosis.   The left ventricular systolic function is normal.  LV end diastolic pressure is normal.   The left ventricular ejection fraction is 55-65% by visual estimate.   Recommend uninterrupted dual antiplatelet therapy with Aspirin  81mg  daily and Ticagrelor  90mg  twice daily for a minimum of 6 months (stable ischemic heart disease-Class I recommendation). 2 vessel obstructive CAD involving instent restenosis in 2 areas in the first OM. Stents in the LAD and throughout the RCA are patent. 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 Normal LV function Normal LVEDP Successful PCI of the first OM with Scoring balloon, ICL Shockwave therapy, high pressure noncompliant balloon followed by Drug coated balloon therapy Plan: DAPT with Brilinta  90 mg bid, ASA 81 mg daily for at least 6 months. Then long term therapy with Brilinta  60 mg bid. Anticipate same day DC   DG Chest 2 View Result Date: 03/15/2024 CLINICAL DATA:  Chest pain x1 week. EXAM: CHEST - 2 VIEW COMPARISON:  Chest PA Lat 09/14/2021. FINDINGS: The heart size and mediastinal contours are within normal limits. There are multiple coronary artery stents. Both lungs are clear. The visualized skeletal structures are intact, with old surgical changes in the right humeral head, chronic thoracic spondylosis, mild osteopenia. IMPRESSION: No evidence of acute  chest disease. Coronary artery stents. Electronically Signed   By: Denman Fischer M.D.   On: 03/15/2024 00:03   Disposition   Pt is being discharged home today in good condition.  Follow-up Plans & Appointments     Follow-up Information     Jude Norton, NP Follow up on 04/04/2024.   Specialties: Cardiology, Family Medicine Why: @2 :20PM. Cardiology follow up Contact information: [address] [place] [address] [phone]                Discharge Instructions     Amb Referral to Cardiac Rehabilitation   Complete by: As directed    Diagnosis: PTCA   After initial evaluation and assessments completed: Virtual Based Care may be provided alone or in conjunction with Phase 2 Cardiac Rehab based on patient barriers.: Yes   Intensive Cardiac Rehabilitation (ICR) [facility] location only OR Traditional Cardiac Rehabilitation (TCR) *If criteria for ICR are not met will enroll in TCR [facility] only): Yes   Diet - low sodium heart healthy   Complete by: As directed    Discharge instructions   Complete by: As directed    No lifting over 5 lbs for 1 week. No sexual activity for 1 week. Keep procedure site clean & dry. If you notice increased pain, swelling, bleeding or pus, call/return!  You may shower, but no soaking baths/hot tubs/pools for 1 week.   Increase activity slowly   Complete by: As directed         Discharge Medications   Allergies as of 03/21/2024       Reactions   Plavix [clopidogrel Bisulfate] Other (See Comments)   Stomach upset   Adhesive [tape] Rash   Site latex allergy   Latex Rash        Medication List     TAKE these medications    Accu-Chek Guide test strip Generic drug: glucose blood Use as instructed   amLODipine  5 MG tablet Commonly known as: NORVASC  TAKE 1 TABLET(5 MG) BY MOUTH IN THE MORNING AND AT BEDTIME   aspirin  81 MG chewable tablet Chew 1 tablet (81 mg total) by mouth daily.   atorvastatin  80 MG tablet Commonly known as:  LIPITOR  TAKE 1 TABLET(80 MG) BY MOUTH DAILY AT 6 PM   Collagen Hydrolysate (  Bovine) Powd Take 1 capsule by mouth daily.   cyanocobalamin 250 MCG tablet Commonly known as: VITAMIN B12 Take 250 mcg by mouth daily.   diphenhydrAMINE  50 MG capsule Commonly known as: BENADRYL  Take 50 mg by mouth at bedtime as needed for sleep.   ezetimibe  10 MG tablet Commonly known as: ZETIA  TAKE 1 TABLET(10 MG) BY MOUTH DAILY   gabapentin  100 MG capsule Commonly known as: NEURONTIN  Take 1 capsule (100 mg total) by mouth at bedtime. TAKE 1 TO 3 CAPSULES(100 TO 300 MG) BY MOUTH AT BEDTIME What changed:  how much to take additional instructions   isosorbide  mononitrate 30 MG 24 hr tablet Commonly known as: IMDUR  Take 3 tablets (90 mg total) by mouth daily. What changed:  medication strength how much to take   Janumet  XR 50-1000 MG Tb24 Generic drug: SitaGLIPtin -MetFORMIN  HCl Take 2 tablets by mouth every morning.   metoprolol  succinate 25 MG 24 hr tablet Commonly known as: TOPROL -XL TAKE 1 TABLET(25 MG) BY MOUTH DAILY What changed: See the new instructions.   multivitamin with minerals Tabs tablet Take 1 tablet by mouth at bedtime. Centrum Silver for Women   nitroGLYCERIN  0.4 MG SL tablet Commonly known as: NITROSTAT  Place 1 tablet (0.4 mg total) under the tongue every 5 (five) minutes as needed for chest pain.   omeprazole 20 MG capsule Commonly known as: PRILOSEC Take 20 mg by mouth daily before breakfast.   ticagrelor  90 MG Tabs tablet Commonly known as: BRILINTA  Take 1 tablet (90 mg total) by mouth 2 (two) times daily. What changed:  medication strength how much to take           Outstanding Labs/Studies   N/A  Duration of Discharge Encounter: APP Time: 15 minutes   Signed, Ervin Heath, PA 03/21/2024, 10:49 AM  Patient seen, examined. Available data reviewed. Agree with findings, assessment, and plan as outlined by Ervin Heath, PA.  Patient is independently  interviewed and examined.  She is alert, oriented, no distress.  HEENT is normal, lungs clear bilaterally, heart is regular rate rhythm no murmur gallop, abdomen soft nontender, right groin site is clear with no hematoma or ecchymosis, lower extremities have no edema.  I reviewed her cardiac catheterization films from yesterday and she underwent successful PCI of severe in-stent restenosis in the obtuse marginal branch of the circumflex.  Patient treated with high-pressure balloon angioplasty followed by drug-eluting balloon angioplasty.  She will be treated with aspirin  and ticagrelor  for 6 months at the 90 mg twice daily dosing of ticagrelor , then will resume her previous 60 mg twice daily ticagrelor  dosing.  Otherwise, we will continue her current medical program which includes a high intensity statin drug (atorvastatin  80 mg), ezetimibe , isosorbide , metoprolol  succinate, and amlodipine .  Patient medically stable for discharge today.  Post PCI instructions/restrictions reviewed with the patient.  MD time conducting patient discharge equal to 20 minutes.  This includes time spent reviewing her cardiac catheterization films, medical regimen, lab data, and counseling her regarding discharge instructions and follow-up planning.   Arnoldo Lapping, M.D. 03/21/2024 11:57 AM

## 2024-03-22 ENCOUNTER — Other Ambulatory Visit (HOSPITAL_COMMUNITY): Payer: Self-pay

## 2024-03-22 MED FILL — Nitroglycerin IV Soln 100 MCG/ML in D5W: INTRA_ARTERIAL | Qty: 10 | Status: AC

## 2024-03-23 LAB — LIPOPROTEIN A (LPA): Lipoprotein (a): 156.6 nmol/L — ABNORMAL HIGH (ref <75.0)

## 2024-03-26 NOTE — Telephone Encounter (Signed)
 Spoke to patient Dr.Jordan advised take Metoprolol  Succ 25 mg 1&1/2 tablets (37.5 mg) daily

## 2024-03-28 ENCOUNTER — Telehealth (HOSPITAL_COMMUNITY): Payer: Self-pay

## 2024-03-28 NOTE — Telephone Encounter (Signed)
 Pt insurance is active and benefits verified through [facility]. Co-pay $20.00, DED $0.00/$0.00 met, out of pocket $3,040.00/$130.00 met, co-insurance 0%. No pre-authorization required. [employer]port, 03/28/24 @ 3:56PM, REF#[id number]   How many CR sessions are covered? (36 visits for TCR, 72 visits for ICR)72 Is this a lifetime maximum or an annual maximum? Annual Has the member used any of these services to date? No Is there a time limit (weeks/months) on start of program and/or program completion? No     Will contact patient to see if she is interested in the Cardiac Rehab Program. If interested, patient will need to complete follow up appt. Once completed, patient will be contacted for scheduling upon review by the RN Navigator.

## 2024-03-28 NOTE — Telephone Encounter (Signed)
 Attempted to call patient in regards to Cardiac Rehab - LM on VM

## 2024-03-30 ENCOUNTER — Other Ambulatory Visit: Payer: Self-pay | Admitting: Interventional Cardiology

## 2024-04-01 ENCOUNTER — Other Ambulatory Visit (HOSPITAL_BASED_OUTPATIENT_CLINIC_OR_DEPARTMENT_OTHER): Payer: Self-pay | Admitting: *Deleted

## 2024-04-01 DIAGNOSIS — G6289 Other specified polyneuropathies: Secondary | ICD-10-CM

## 2024-04-01 MED ORDER — GABAPENTIN 100 MG PO CAPS: 100.0000 mg | ORAL_CAPSULE | Freq: Every day | ORAL | 0 refills | Status: DC

## 2024-04-04 ENCOUNTER — Other Ambulatory Visit (HOSPITAL_COMMUNITY): Payer: Self-pay

## 2024-04-04 ENCOUNTER — Encounter: Payer: Self-pay | Admitting: Nurse Practitioner

## 2024-04-04 ENCOUNTER — Ambulatory Visit: Attending: Nurse Practitioner | Admitting: Nurse Practitioner

## 2024-04-04 VITALS — BP 94/50 | HR 75 | Ht 62.0 in | Wt 161.0 lb

## 2024-04-04 DIAGNOSIS — E1159 Type 2 diabetes mellitus with other circulatory complications: Secondary | ICD-10-CM | POA: Diagnosis not present

## 2024-04-04 DIAGNOSIS — E785 Hyperlipidemia, unspecified: Secondary | ICD-10-CM

## 2024-04-04 DIAGNOSIS — I10 Essential (primary) hypertension: Secondary | ICD-10-CM | POA: Diagnosis not present

## 2024-04-04 DIAGNOSIS — I2510 Atherosclerotic heart disease of native coronary artery without angina pectoris: Secondary | ICD-10-CM | POA: Diagnosis not present

## 2024-04-04 MED ORDER — METOPROLOL SUCCINATE ER 25 MG PO TB24: 37.5000 mg | ORAL_TABLET | Freq: Every day | ORAL | 3 refills | Status: AC

## 2024-04-04 MED ORDER — AMLODIPINE BESYLATE 5 MG PO TABS: 5.0000 mg | ORAL_TABLET | Freq: Every day | ORAL | 3 refills | Status: AC

## 2024-04-04 NOTE — Progress Notes (Unsigned)
 Office Visit    Patient Name: Suzanne Watkins Date of Encounter: 04/04/2024  Primary Care Provider:  [place], Alonza Jansky, MD Primary Cardiologist:  Peter Swaziland, MD  Chief Complaint    73 year old female with a history of CAD s/p prior stenting-mLAD, pRCA, m-dRCA, OM1 x2 (ISR), PTCA-RPL, OM1, and more recently PTCA-OM1 in 02/2024, hypertension, hyperlipidemia, type 2 diabetes, GERD, and arthritis who presents for hospital follow-up related to CAD s/p PCI.  Past Medical History    Past Medical History:  Diagnosis Date   Anginal pain (HCC)    Arthritis    "probably in my hands" (09/04/2013)   Basal cell carcinoma of nostril 05/2008   Cancer (HCC)    Phreesia 05/17/2020   Cervical cancer (HCC) 1986   Coronary atherosclerosis of native coronary artery 09/04/2013   RCA and obtuse marginal stent/DES-2010   Diabetes mellitus without complication (HCC)    Phreesia 05/17/2020   Displaced fracture of distal end of right fibula 10/10/2018   Fibromyalgia    "dx'd in 1994"   Full dentures    GERD (gastroesophageal reflux disease)    Hyperlipidemia    Hypertension    Myocardial infarction (HCC)    Phreesia 05/17/2020   Obesity    Shortness of breath    "just related to angina I was having recently" (09/04/2013)   Type II diabetes mellitus (HCC)    followed by pcp   Unstable angina (HCC) 09/04/2013   Wears glasses    Past Surgical History:  Procedure Laterality Date   CARPAL TUNNEL RELEASE Right 07/1992   CERVICAL CONE BIOPSY  03/1985   CORONARY ANGIOPLASTY WITH STENT PLACEMENT  07/2009, 08/2009; 09/04/2013   "1+1+2; total of 4" (09/04/2013)   CORONARY BALLOON ANGIOPLASTY N/A 10/06/2020   Procedure: CORONARY BALLOON ANGIOPLASTY;  Surgeon: Lucendia Rusk, MD;  Location: [facility] INVASIVE CV LAB;  Service: Cardiovascular;  Laterality: N/A;   CORONARY BALLOON ANGIOPLASTY N/A 07/30/2021   Procedure: CORONARY BALLOON ANGIOPLASTY;  Surgeon: Lucendia Rusk, MD;  Location: [facility] INVASIVE CV  LAB;  Service: Cardiovascular;  Laterality: N/A;   CORONARY BALLOON ANGIOPLASTY N/A 03/20/2024   Procedure: CORONARY BALLOON ANGIOPLASTY;  Surgeon: Swaziland, Peter M, MD;  Location: [facility] INVASIVE CV LAB;  Service: Cardiovascular;  Laterality: N/A;   CORONARY LITHOTRIPSY N/A 03/20/2024   Procedure: CORONARY LITHOTRIPSY;  Surgeon: Swaziland, Peter M, MD;  Location: [facility] INVASIVE CV LAB;  Service: Cardiovascular;  Laterality: N/A;   CORONARY STENT INTERVENTION N/A 01/15/2021   Procedure: CORONARY STENT INTERVENTION;  Surgeon: Lucendia Rusk, MD;  Location: [facility] INVASIVE CV LAB;  Service: Cardiovascular;  Laterality: N/A;   FRACTURE SURGERY N/A    Phreesia 05/17/2020   LEFT HEART CATH AND CORONARY ANGIOGRAPHY N/A 10/06/2020   Procedure: LEFT HEART CATH AND CORONARY ANGIOGRAPHY;  Surgeon: Lucendia Rusk, MD;  Location: [facility] INVASIVE CV LAB;  Service: Cardiovascular;  Laterality: N/A;   LEFT HEART CATH AND CORONARY ANGIOGRAPHY N/A 01/15/2021   Procedure: LEFT HEART CATH AND CORONARY ANGIOGRAPHY;  Surgeon: Lucendia Rusk, MD;  Location: [facility] INVASIVE CV LAB;  Service: Cardiovascular;  Laterality: N/A;   LEFT HEART CATH AND CORONARY ANGIOGRAPHY N/A 07/30/2021   Procedure: LEFT HEART CATH AND CORONARY ANGIOGRAPHY;  Surgeon: Lucendia Rusk, MD;  Location: [facility] INVASIVE CV LAB;  Service: Cardiovascular;  Laterality: N/A;   LEFT HEART CATH AND CORONARY ANGIOGRAPHY N/A 03/20/2024   Procedure: LEFT HEART CATH AND CORONARY ANGIOGRAPHY;  Surgeon: Swaziland, Peter M, MD;  Location: [facility] INVASIVE CV  LAB;  Service: Cardiovascular;  Laterality: N/A;   LEFT HEART CATHETERIZATION WITH CORONARY ANGIOGRAM N/A 09/04/2013   Procedure: LEFT HEART CATHETERIZATION WITH CORONARY ANGIOGRAM;  Surgeon: Dorothye Gathers, MD;  Location: [facility] CATH LAB;  Service: Cardiovascular;  Laterality: N/A;   MOHS SURGERY Left 05/2008   "nostril"   ORIF ANKLE FRACTURE Right 10/10/2018   Procedure: OPEN REDUCTION INTERNAL FIXATION (ORIF) ANKLE FRACTURE;   Surgeon: Adonica Hoose, MD;  Location: [facility] ORS;  Service: Orthopedics;  Laterality: Right;   SHOULDER OPEN ROTATOR CUFF REPAIR Right 10/2004   "got 5 pins in" (09/04/2013)   TUBAL LIGATION Bilateral 1979    Allergies  Allergies  Allergen Reactions   Plavix [Clopidogrel Bisulfate] Other (See Comments)    Stomach upset   Adhesive [Tape] Rash    Site latex allergy   Latex Rash     Labs/Other Studies Reviewed    The following studies were reviewed today:  Cardiac Studies & Procedures   ______________________________________________________________________________________________ CARDIAC CATHETERIZATION  CARDIAC CATHETERIZATION 03/20/2024  Conclusion   RPDA lesion is 50% stenosed.   2nd RPL lesion is 100% stenosed.   1st Mrg-1 lesion is 90% stenosed.   1st Mrg-2 lesion is 80% stenosed.   Non-stenotic Mid LAD lesion was previously treated.   Non-stenotic Prox RCA lesion was previously treated.   Non-stenotic Mid RCA to Dist RCA lesion was previously treated.   Drug-coated balloon angioplasty was performed using a BALL DC AGENT 3.0X20.   Drug-coated balloon angioplasty was performed using a BALLOON SCOREFLEX 2.50X15.   Post intervention, there is a 0% residual stenosis.   Post intervention, there is a 0% residual stenosis.   The left ventricular systolic function is normal.   LV end diastolic pressure is normal.   The left ventricular ejection fraction is 55-65% by visual estimate.   Recommend uninterrupted dual antiplatelet therapy with Aspirin  81mg  daily and Ticagrelor  90mg  twice daily for a minimum of 6 months (stable ischemic heart disease-Class I recommendation).  2 vessel obstructive CAD involving instent restenosis in 2 areas in the first OM. Stents in the LAD and throughout the RCA are patent. 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 Normal LV function Normal LVEDP Successful PCI of the first OM with Scoring balloon, ICL Shockwave therapy, high pressure  noncompliant balloon followed by Drug coated balloon therapy  Plan: DAPT with Brilinta  90 mg bid, ASA 81 mg daily for at least 6 months. Then long term therapy with Brilinta  60 mg bid. Anticipate same day DC  Findings Coronary Findings Diagnostic  Dominance: Right  Left Anterior Descending There is mild diffuse disease throughout the vessel. Non-stenotic Mid LAD lesion was previously treated.  Left Circumflex There is mild diffuse disease throughout the vessel.  First Obtuse Marginal Branch Vessel is large in size. 1st Mrg-1 lesion is 90% stenosed. The lesion was previously treated using a drug eluting stent over 2 years ago. 1st Mrg-2 lesion is 80% stenosed. Vessel is the culprit lesion. The lesion is type C. The lesion is calcified. The lesion was previously treated between 6-12 months ago. Previously placed stent displays restenosis.  Right Coronary Artery Non-stenotic Prox RCA lesion was previously treated. Non-stenotic Mid RCA to Dist RCA lesion was previously treated.  Right Posterior Descending Artery RPDA lesion is 50% stenosed.  Second Right Posterolateral Branch Collaterals 2nd RPL filled by collaterals from Dist LAD.  2nd RPL lesion is 100% stenosed. The lesion is chronically occluded. The lesion was previously treated .  Intervention  1st Mrg-1 lesion  Angioplasty CATH LAUNCHER 6FR EBU3.5 guide catheter was inserted. WIRE ASAHI PROWATER 180CM guidewire used to cross lesion. Drug-coated balloon angioplasty was performed using a BALL DC AGENT 3.0X20.  A second ballloon was used, using a cutting  BALLOON SCOREFLEX 2.50X15. A third ballloon was used, using a non-compliant BALLOON SAPPHIRE NC24 3.0X15. IVL Intravascular Lithotripsy was performed using a CATH SHOCKWAVE C2 3.0X12 with a maximum pressure of 4 atm, for 60 pulses. Post-Intervention Lesion Assessment The intervention was successful. Pre-interventional TIMI flow is 3. Post-intervention TIMI flow is 3. No  complications occurred at this lesion. There is a 0% residual stenosis post intervention.  1st Mrg-2 lesion Angioplasty Drug-coated balloon angioplasty was performed using a BALLOON SCOREFLEX 2.50X15.  A second ballloon was used, using a cutting  BALLOON SCOREFLEX 2.50X15. A third ballloon was used, using a non-compliant BALLOON SAPPHIRE NC24 3.0X15. IVL Intravascular Lithotripsy was performed using a CATH SHOCKWAVE C2 3.0X12 for 60 pulses. Post-Intervention Lesion Assessment The intervention was successful. Pre-interventional TIMI flow is 3. Post-intervention TIMI flow is 3. No complications occurred at this lesion. There is a 0% residual stenosis post intervention.   CARDIAC CATHETERIZATION  CARDIAC CATHETERIZATION 07/30/2021  Conclusion   2nd RPL lesion is 80% stenosed. There is TIMI 3 flow in this small vessel. It is unchanged from prior.   RPDA lesion is 50% stenosed.   1st Mrg-proximal lesion is 95% stenosed- instent restenosis.  Scoring balloon angioplasty was performed using a BALLN SCOREFLEX 2.50X15,followed by PTCA with 3.0 mm [place] balloon.   Post intervention, there is a 0% residual stenosis.   1st Mrg- lesion is 80% stenosed distali in stent restenosis in distal stent.  Scoring balloon angioplasty was performed using a BALLN SCOREFLEX 2.50X15, followed by PTCA with 2.75 mm [place] balloon.   Post intervention, there is a 10% residual stenosis.   Non-stenotic Mid LAD lesion was previously treated.   Non-stenotic Prox RCA lesion was previously treated.   Non-stenotic Mid RCA to Dist RCA lesion was previously treated.   Non-stenotic 1st Mrg-1 lesion was previously treated.   The left ventricular systolic function is normal.   LV end diastolic pressure is normal.   The left ventricular ejection fraction is 55-65% by visual estimate.   There is no aortic valve stenosis.  Continue aggressive secondary prevention.   Plan for discharge in AM.  BP borderline during cath.  Decreasing  dosages of BP meds for now.  Likely can return to baseline doses at discharge.  If she has restenosis again in the same area proximally, would consider dilatation with a shockwave balloon given 180 degree arc of calcium  behind stented segment.  Findings Coronary Findings Diagnostic  Dominance: Right  Left Anterior Descending There is mild diffuse disease throughout the vessel. Non-stenotic Mid LAD lesion was previously treated.  Left Circumflex There is mild diffuse disease throughout the vessel.  First Obtuse Marginal Branch Vessel is large in size. Non-stenotic 1st Mrg-1 lesion was previously treated. 1st Mrg-2 lesion is 95% stenosed. Vessel is the culprit lesion. The lesion is type C. The lesion is calcified. The lesion was previously treated between 6-12 months ago. Previously placed stent displays restenosis. 1st Mrg-3 lesion is 80% stenosed.  Right Coronary Artery Non-stenotic Prox RCA lesion was previously treated. Non-stenotic Mid RCA to Dist RCA lesion was previously treated.  Right Posterior Descending Artery RPDA lesion is 50% stenosed.  Second Right Posterolateral Branch 2nd RPL lesion is 80% stenosed. The lesion is chronically occluded. The lesion was previously treated .  Intervention  1st Mrg-2 lesion Angioplasty CATH LAUNCHER 6FR EBU 4 guide catheter was inserted. WIRE ASAHI PROWATER 180CM guidewire used to cross lesion. Scoring balloon angioplasty was performed using a BALLN SCOREFLEX 2.50X15. After scoring balloon, a 3.0 [place] balloon was used to postdilate the stent. IVUS showed some heavily calcified areas. Post-Intervention Lesion Assessment The intervention was successful. Pre-interventional TIMI flow is 3. Post-intervention TIMI flow is 3. No complications occurred at this lesion. Ultrasound (IVUS) was performed on the lesion post PCI using a CATHETER OPTICROSS HD. Stent well apposed. Lesion characteristics:  calcified. There is a 0% residual stenosis post  intervention.  1st Mrg-3 lesion Angioplasty WIRE ASAHI PROWATER 180CM guidewire used to cross lesion. Scoring balloon angioplasty was performed using a BALLN SCOREFLEX 2.50X15. Scoring balloon was followed by a 2.75 [place] balloon inflated to high pressure. Post-Intervention Lesion Assessment There is a 10% residual stenosis post intervention.              ______________________________________________________________________________________________     Recent Labs: 01/23/2024: TSH 4.470 03/18/2024: ALT 24 03/21/2024: BUN 15; Creatinine, Ser 0.78; Hemoglobin 12.2; Platelets 195; Potassium 4.5; Sodium 138  Recent Lipid Panel    Component Value Date/Time   CHOL 120 03/18/2024 1133   CHOL 127 10/28/2014 0801   TRIG 141 03/18/2024 1133   TRIG 192 (H) 10/28/2014 0801   HDL 36 (L) 03/18/2024 1133   HDL 30 (L) 10/28/2014 0801   CHOLHDL 3.3 03/18/2024 1133   CHOLHDL 3.8 08/08/2016 0731   VLDL 22 08/08/2016 0731   LDLCALC 59 03/18/2024 1133   LDLCALC 59 10/28/2014 0801    History of Present Illness    73 year old female with the above past medical history including CAD s/p prior stenting-mLAD, pRCA, m-dRCA, OM1 x2 (ISR), PTCA-RPL, OM1, and more recently PTCA-OM1 in 02/2024, hypertension, hyperlipidemia, type 2 diabetes, GERD, and arthritis.  She has a history of significant CAD s/p multiple interventions as above.  She was last seen in the office on 03/18/2024 and reported increased chest pain with exertion.  She was referred for outpatient cardiac catheterization which revealed 50% RPDA, 89% OM1 (ISR) s/p PTCA, patent mid LAD and mid to distal RCA stents, occluded PL 2 with left-to-right collaterals, EF 55 to 65%.  Brilinta  was increased to 90 mg twice daily post cath.  It was recommended she continue aspirin  and Brilinta  90 mg twice daily x 6 months followed by long-term therapy with Brilinta  60 mg twice daily.  She was discharged home in stable condition 03/22/2024.  She presents today  for follow-up.  Since her procedure She has done well from a cardiac standpoint.  She is increasing her activity, she denies any chest pain, dyspnea, palpitations, dizziness, edema, PND, with apnea, weight gain.  Her BP has been running on the low side.  She is taking metoprolol  37.5 mg daily as well as instructed by Dr. Sw[place].  Will reflect this in her medications.  Will decrease amlodipine  to 5 mg daily (she is currently taking 5 mg twice daily).  Right groin site without bruising, bleeding, bruit or hematoma.  Overall, she reports feeling well.  Follow-up in 3 to 4 months.  BP Readings from Last 3 Encounters:  04/04/24 (!) 94/50  03/21/24 110/73  03/18/24 124/60    1. CAD: S/p prior stenting-mLAD, pRCA, m-dRCA, OM1 x2 (ISR), PTCA-RPL, OM1, and more recently PTCA-OM1 in 02/2024. Stable with no anginal symptoms. No indication for ischemic evaluation.  Continue   2. Hypertension: BP well controlled. Continue current antihypertensive regimen.  3. Hyperlipidemia: LDL was 59 in 02/2024.   4. Type 2 diabetes: A1c  was 5.6 in 12/2023.   5. Disposition: Follow-up in   Home Medications    Current Outpatient Medications  Medication Sig Dispense Refill   amLODipine  (NORVASC ) 5 MG tablet TAKE 1 TABLET(5 MG) BY MOUTH IN THE MORNING AND AT BEDTIME 180 tablet 3   aspirin  81 MG chewable tablet Chew 1 tablet (81 mg total) by mouth daily. 90 tablet 0   atorvastatin  (LIPITOR ) 80 MG tablet TAKE 1 TABLET(80 MG) BY MOUTH DAILY AT 6 PM 90 tablet 3   Collagen Hydrolysate, Bovine, POWD Take 1 capsule by mouth daily.     cyanocobalamin (VITAMIN B12) 250 MCG tablet Take 250 mcg by mouth daily.     diphenhydrAMINE  (BENADRYL ) 50 MG capsule Take 50 mg by mouth at bedtime as needed for sleep.     ezetimibe  (ZETIA ) 10 MG tablet TAKE 1 TABLET(10 MG) BY MOUTH DAILY 90 tablet 3   gabapentin  (NEURONTIN ) 100 MG capsule Take 1 capsule (100 mg total) by mouth at bedtime. TAKE 1 TO 3 CAPSULES(100 TO 300 MG) BY MOUTH AT  BEDTIME 270 capsule 0   glucose blood (ACCU-CHEK GUIDE) test strip Use as instructed 300 each 12   isosorbide  mononitrate (IMDUR ) 30 MG 24 hr tablet Take 3 tablets (90 mg total) by mouth daily. 270 tablet 3   metoprolol  succinate (TOPROL -XL) 25 MG 24 hr tablet TAKE 1 TABLET(25 MG) BY MOUTH DAILY (Patient taking differently: Take 37.5 mg by mouth daily.) 90 tablet 3   Multiple Vitamin (MULTIVITAMIN WITH MINERALS) TABS tablet Take 1 tablet by mouth at bedtime. Centrum Silver for Women     nitroGLYCERIN  (NITROSTAT ) 0.4 MG SL tablet Place 1 tablet (0.4 mg total) under the tongue every 5 (five) minutes as needed for chest pain. 28 tablet 3   omeprazole (PRILOSEC) 20 MG capsule Take 20 mg by mouth daily before breakfast.      SitaGLIPtin -MetFORMIN  HCl (JANUMET  XR) 50-1000 MG TB24 Take 2 tablets by mouth every morning. 180 tablet 1   ticagrelor  (BRILINTA ) 90 MG TABS tablet Take 1 tablet (90 mg total) by mouth 2 (two) times daily. 180 tablet 1   No current facility-administered medications for this visit.     Review of Systems    ***.  All other systems reviewed and are otherwise negative except as noted above. {The patient has an active order for outpatient cardiac rehabilitation.   Please indicate if the patient is ready to start. Do NOT delete this.  It will auto delete.  Refresh note, then sign.              Click here to document readiness and see contraindications.  :1}  Cardiac Rehabilitation Eligibility Assessment      Physical Exam    VS:  BP (!) 94/50   Pulse 75   Ht 5\' 2"  (1.575 m)   Wt 161 lb (73 kg)   SpO2 94%   BMI 29.45 kg/m  GEN: Well nourished, well developed, in no acute distress. HEENT: normal. Neck: Supple, no JVD, carotid bruits, or masses. Cardiac: RRR, no murmurs, rubs, or gallops. No clubbing, cyanosis, edema.  Radials/DP/PT 2+ and equal bilaterally.  Respiratory:  Respirations regular and unlabored, clear to auscultation bilaterally. GI: Soft, nontender,  nondistended, BS + x 4. MS: no deformity or atrophy. Skin: warm and dry, no rash. Neuro:  Strength and sensation are intact. Psych: Normal affect.  Accessory Clinical Findings  ECG personally reviewed by me today - EKG Interpretation Date/Time:  Thursday April 04 2024 14:15:03 EDT Ventricular Rate:  75 PR Interval:  170 QRS Duration:  114 QT Interval:  420 QTC Calculation: 469 R Axis:   -71  Text Interpretation: Normal sinus rhythm Low voltage QRS Incomplete right bundle branch block Left anterior fascicular block When compared with ECG of 21-Mar-2024 06:16, No significant change was found Confirmed by Marlana Silvan ([id number]) on 04/04/2024 2:31:51 PM  - no acute changes.   Lab Results  Component Value Date   WBC 8.6 03/21/2024   HGB 12.2 03/21/2024   HCT 35.4 (L) 03/21/2024   MCV 92.4 03/21/2024   PLT 195 03/21/2024   Lab Results  Component Value Date   CREATININE 0.78 03/21/2024   BUN 15 03/21/2024   NA 138 03/21/2024   K 4.5 03/21/2024   CL 107 03/21/2024   CO2 24 03/21/2024   Lab Results  Component Value Date   ALT 24 03/18/2024   AST 28 03/18/2024   ALKPHOS 74 03/18/2024   BILITOT 0.6 03/18/2024   Lab Results  Component Value Date   CHOL 120 03/18/2024   HDL 36 (L) 03/18/2024   LDLCALC 59 03/18/2024   TRIG 141 03/18/2024   CHOLHDL 3.3 03/18/2024    Lab Results  Component Value Date   HGBA1C 5.6 01/23/2024    Assessment & Plan    1.  ***      Jude Norton, NP 04/04/2024, 2:32 PM

## 2024-04-04 NOTE — Patient Instructions (Signed)
 Medication Instructions:  DECREASE AMLODIPINE  5 MG TAKE ONE TABLET DAILY CONTINUE TOPROL  XL 25 MG TAKE ONE AND ONE HALF TABLET DAILY   Follow-Up: At [facility], you and your health needs are our priority.  As part of our continuing mission to provide you with exceptional heart care, our providers are all part of one team.  This team includes your primary Cardiologist (physician) and Advanced Practice Providers or APPs (Physician Assistants and Nurse Practitioners) who all work together to provide you with the care you need, when you need it.  Your next appointment:   3-4 month(s)  Provider:   Peter [place], MD or Marlana Silvan, NP   We recommend signing up for the patient portal called "MyChart".  Sign up information is provided on this After Visit Summary.  MyChart is used to connect with patients for Virtual Visits (Telemedicine).  Patients are able to view lab/test results, encounter notes, upcoming appointments, etc.  Non-urgent messages can be sent to your provider as well.   To learn more about what you can do with MyChart, go to [email or web address].   Other Instructions MONITOR YOUR BLOOD PRESSURE. REPORT ANY READINGS GREATER THAN 130/80

## 2024-04-05 ENCOUNTER — Encounter: Payer: Self-pay | Admitting: Nurse Practitioner

## 2024-04-08 ENCOUNTER — Encounter (HOSPITAL_COMMUNITY): Payer: Self-pay

## 2024-04-13 LAB — HM DIABETES EYE EXAM

## 2024-04-14 ENCOUNTER — Other Ambulatory Visit (HOSPITAL_COMMUNITY): Payer: Self-pay

## 2024-04-15 ENCOUNTER — Other Ambulatory Visit (HOSPITAL_COMMUNITY): Payer: Self-pay

## 2024-04-16 ENCOUNTER — Other Ambulatory Visit (HOSPITAL_COMMUNITY): Payer: Self-pay

## 2024-04-18 ENCOUNTER — Other Ambulatory Visit (HOSPITAL_COMMUNITY): Payer: Self-pay

## 2024-04-25 ENCOUNTER — Telehealth (HOSPITAL_COMMUNITY): Payer: Self-pay

## 2024-04-25 NOTE — Telephone Encounter (Signed)
 Attempted follow-up call to patient in regards to cardiac rehab referral- no answer, left message on home number (cell number does not have a voicemail box available). Informed patient we have been trying to reach them for a couple weeks now and will go ahead and close their referral, and if they have any questions or would like us  to reopen it to call us  back. Also sent MyChart message.  Closing referral.

## 2024-05-13 DIAGNOSIS — H5213 Myopia, bilateral: Secondary | ICD-10-CM | POA: Diagnosis not present

## 2024-05-13 DIAGNOSIS — E119 Type 2 diabetes mellitus without complications: Secondary | ICD-10-CM | POA: Diagnosis not present

## 2024-05-13 DIAGNOSIS — H2513 Age-related nuclear cataract, bilateral: Secondary | ICD-10-CM | POA: Diagnosis not present

## 2024-05-20 ENCOUNTER — Other Ambulatory Visit (HOSPITAL_COMMUNITY): Payer: Self-pay

## 2024-05-30 DIAGNOSIS — H2512 Age-related nuclear cataract, left eye: Secondary | ICD-10-CM | POA: Diagnosis not present

## 2024-05-30 DIAGNOSIS — Z961 Presence of intraocular lens: Secondary | ICD-10-CM | POA: Diagnosis not present

## 2024-05-30 DIAGNOSIS — H2511 Age-related nuclear cataract, right eye: Secondary | ICD-10-CM | POA: Diagnosis not present

## 2024-06-13 DIAGNOSIS — H2511 Age-related nuclear cataract, right eye: Secondary | ICD-10-CM | POA: Diagnosis not present

## 2024-06-13 DIAGNOSIS — Z961 Presence of intraocular lens: Secondary | ICD-10-CM | POA: Diagnosis not present

## 2024-06-13 DIAGNOSIS — H25811 Combined forms of age-related cataract, right eye: Secondary | ICD-10-CM | POA: Diagnosis not present

## 2024-06-16 ENCOUNTER — Encounter: Payer: Self-pay | Admitting: Cardiology

## 2024-06-18 ENCOUNTER — Encounter (HOSPITAL_BASED_OUTPATIENT_CLINIC_OR_DEPARTMENT_OTHER): Payer: Self-pay | Admitting: Family Medicine

## 2024-06-24 ENCOUNTER — Encounter (HOSPITAL_BASED_OUTPATIENT_CLINIC_OR_DEPARTMENT_OTHER): Payer: Self-pay | Admitting: Family Medicine

## 2024-06-24 ENCOUNTER — Encounter (HOSPITAL_BASED_OUTPATIENT_CLINIC_OR_DEPARTMENT_OTHER): Payer: Self-pay | Admitting: *Deleted

## 2024-06-24 ENCOUNTER — Ambulatory Visit (HOSPITAL_BASED_OUTPATIENT_CLINIC_OR_DEPARTMENT_OTHER): Admitting: Family Medicine

## 2024-06-24 VITALS — BP 117/67 | HR 69 | Ht 62.5 in | Wt 166.5 lb

## 2024-06-24 DIAGNOSIS — E1159 Type 2 diabetes mellitus with other circulatory complications: Secondary | ICD-10-CM | POA: Diagnosis not present

## 2024-06-24 DIAGNOSIS — Z7984 Long term (current) use of oral hypoglycemic drugs: Secondary | ICD-10-CM | POA: Diagnosis not present

## 2024-06-24 DIAGNOSIS — Z23 Encounter for immunization: Secondary | ICD-10-CM

## 2024-06-24 LAB — POCT GLYCOSYLATED HEMOGLOBIN (HGB A1C)
HbA1c POC (<> result, manual entry): 5.6 % (ref 4.0–5.6)
HbA1c, POC (controlled diabetic range): 5.6 % (ref 0.0–7.0)
Hemoglobin A1C: 5.6 % (ref 4.0–5.6)

## 2024-06-24 MED ORDER — JANUMET XR 50-1000 MG PO TB24: 2.0000 | ORAL_TABLET | Freq: Every morning | ORAL | 1 refills | Status: AC

## 2024-06-24 NOTE — Progress Notes (Signed)
    Procedures performed today:    None.  Independent interpretation of notes and tests performed by another provider:   None.  Brief History, Exam, Impression, and Recommendations:    BP 117/67 (BP Location: Left Arm, Patient Position: Sitting, Cuff Size: Normal)   Pulse 69   Ht 5' 2.5 (1.588 m)   Wt 166 lb 8 oz (75.5 kg)   SpO2 100%   BMI 29.97 kg/m   Type 2 diabetes mellitus with other circulatory complication, without long-term current use of insulin  [facility]) Assessment & Plan: Patient reports that she has been doing well, continues with medications as prescribed. Last hemoglobin A1c checked several months ago ago was at goal - 5.6%.  No issues with polyuria or polydipsia. Does have some evidence of neuropathy, a few long toenails.  Did discuss possible referral to podiatry to assist with nail care given underlying neuropathy.  Patient declines at this time, however will consider. We will check labs today as below for monitoring. No changes to medication  Orders: -     Janumet  XR; Take 2 tablets by mouth every morning.  Dispense: 180 tablet; Refill: 1  Return in about 4 months (around 10/24/2024) for diabetes, med check.   ___________________________________________ Mahari Strahm de [place], MD, ABFM, CAQSM Primary Care and Sports Medicine [facility]

## 2024-06-24 NOTE — Patient Instructions (Signed)

## 2024-06-24 NOTE — Assessment & Plan Note (Addendum)
 Patient reports that she has been doing well, continues with medications as prescribed. Last hemoglobin A1c checked several months ago ago was at goal - 5.6%.  No issues with polyuria or polydipsia. Does have some evidence of neuropathy, a few long toenails.  Did discuss possible referral to podiatry to assist with nail care given underlying neuropathy.  Patient declines at this time, however will consider. We will check labs today as below for monitoring. No changes to medication

## 2024-06-30 NOTE — Progress Notes (Unsigned)
 Cardiology Office Note:    Date:  07/05/2024   ID:  Suzanne Watkins, DOB 27-Apr-1951, MRN [id number]  PCP:  de [place], Raymond J, MD   [facility] Providers Cardiologist:  Lyzette Reinhardt [place], MD     Referring MD: de [place], Quintin PARAS, MD   Chief Complaint  Patient presents with   Coronary Artery Disease    History of Present Illness:    Suzanne Watkins is a 73 y.o. female seen for follow up CAD. She is a former patient of Dr Dann. She has a history of DM, HLD, HTN. She has had multiple stents in the past beginning in 2009 and last in Sept 2022. At that time she had Scoring balloon angioplasty for in stent restenosis of the OM. Noted severe calcification.  When last seen she had progressive angina. She was referred for outpatient cardiac catheterization on 03/20/24 which revealed 50% RPDA, 89% OM1 (ISR) s/p PTCA with Shockwave and drug coated balloon, patent mid LAD and mid to distal RCA stents, occluded PL 2 with left-to-right collaterals, EF 55 to 65%.  Brilinta  was increased to 90 mg twice daily post cath.  It was recommended she continue aspirin  and Brilinta  90 mg twice daily x 6 months followed by long-term therapy with Brilinta  60 mg twice daily.  She was discharged home in stable condition 03/22/2024.  On follow up today she feels great. No angina. Walking 30 minutes a day. No bleeding. No dyspnea.     Past Medical History:  Diagnosis Date   Anginal pain (HCC)    Arthritis    probably in my hands (09/04/2013)   Basal cell carcinoma of nostril 05/2008   Cancer (HCC)    Phreesia 05/17/2020   Cervical cancer (HCC) 1986   Coronary atherosclerosis of native coronary artery 09/04/2013   RCA and obtuse marginal stent/DES-2010   Diabetes mellitus without complication (HCC)    Phreesia 05/17/2020   Displaced fracture of distal end of right fibula 10/10/2018   Fibromyalgia    dx'd in 1994   Full dentures    GERD (gastroesophageal reflux disease)    Hyperlipidemia     Hypertension    Myocardial infarction (HCC)    Phreesia 05/17/2020   Obesity    Shortness of breath    just related to angina I was having recently (09/04/2013)   Type II diabetes mellitus (HCC)    followed by pcp   Unstable angina (HCC) 09/04/2013   Wears glasses     Past Surgical History:  Procedure Laterality Date   CARPAL TUNNEL RELEASE Right 07/1992   CERVICAL CONE BIOPSY  03/1985   CORONARY ANGIOPLASTY WITH STENT PLACEMENT  07/2009, 08/2009; 09/04/2013   1+1+2; total of 4 (09/04/2013)   CORONARY BALLOON ANGIOPLASTY N/A 10/06/2020   Procedure: CORONARY BALLOON ANGIOPLASTY;  Surgeon: Dann Candyce RAMAN, MD;  Location: [facility] INVASIVE CV LAB;  Service: Cardiovascular;  Laterality: N/A;   CORONARY BALLOON ANGIOPLASTY N/A 07/30/2021   Procedure: CORONARY BALLOON ANGIOPLASTY;  Surgeon: Dann Candyce RAMAN, MD;  Location: [facility] INVASIVE CV LAB;  Service: Cardiovascular;  Laterality: N/A;   CORONARY BALLOON ANGIOPLASTY N/A 03/20/2024   Procedure: CORONARY BALLOON ANGIOPLASTY;  Surgeon: [place], Hadlyn Amero M, MD;  Location: [facility] INVASIVE CV LAB;  Service: Cardiovascular;  Laterality: N/A;   CORONARY LITHOTRIPSY N/A 03/20/2024   Procedure: CORONARY LITHOTRIPSY;  Surgeon: [place], Lynn Sissel M, MD;  Location: [facility] INVASIVE CV LAB;  Service: Cardiovascular;  Laterality: N/A;   CORONARY STENT INTERVENTION N/A 01/15/2021   Procedure:  CORONARY STENT INTERVENTION;  Surgeon: Dann Candyce RAMAN, MD;  Location: [facility] INVASIVE CV LAB;  Service: Cardiovascular;  Laterality: N/A;   FRACTURE SURGERY N/A    Phreesia 05/17/2020   LEFT HEART CATH AND CORONARY ANGIOGRAPHY N/A 10/06/2020   Procedure: LEFT HEART CATH AND CORONARY ANGIOGRAPHY;  Surgeon: Dann Candyce RAMAN, MD;  Location: [facility] INVASIVE CV LAB;  Service: Cardiovascular;  Laterality: N/A;   LEFT HEART CATH AND CORONARY ANGIOGRAPHY N/A 01/15/2021   Procedure: LEFT HEART CATH AND CORONARY ANGIOGRAPHY;  Surgeon: Dann Candyce RAMAN, MD;  Location: [facility] INVASIVE CV LAB;   Service: Cardiovascular;  Laterality: N/A;   LEFT HEART CATH AND CORONARY ANGIOGRAPHY N/A 07/30/2021   Procedure: LEFT HEART CATH AND CORONARY ANGIOGRAPHY;  Surgeon: Dann Candyce RAMAN, MD;  Location: [facility] INVASIVE CV LAB;  Service: Cardiovascular;  Laterality: N/A;   LEFT HEART CATH AND CORONARY ANGIOGRAPHY N/A 03/20/2024   Procedure: LEFT HEART CATH AND CORONARY ANGIOGRAPHY;  Surgeon: [place], Jaecion Dempster M, MD;  Location: [facility] INVASIVE CV LAB;  Service: Cardiovascular;  Laterality: N/A;   LEFT HEART CATHETERIZATION WITH CORONARY ANGIOGRAM N/A 09/04/2013   Procedure: LEFT HEART CATHETERIZATION WITH CORONARY ANGIOGRAM;  Surgeon: Oneil Parchment, MD;  Location: [facility] CATH LAB;  Service: Cardiovascular;  Laterality: N/A;   MOHS SURGERY Left 05/2008   nostril   ORIF ANKLE FRACTURE Right 10/10/2018   Procedure: OPEN REDUCTION INTERNAL FIXATION (ORIF) ANKLE FRACTURE;  Surgeon: Fidel Rogue, MD;  Location: [facility] ORS;  Service: Orthopedics;  Laterality: Right;   SHOULDER OPEN ROTATOR CUFF REPAIR Right 10/2004   got 5 pins in (09/04/2013)   TUBAL LIGATION Bilateral 1979    Current Medications: Current Meds  Medication Sig   amLODipine  (NORVASC ) 5 MG tablet Take 1 tablet (5 mg total) by mouth daily.   aspirin  81 MG chewable tablet Chew 1 tablet (81 mg total) by mouth daily.   atorvastatin  (LIPITOR ) 80 MG tablet TAKE 1 TABLET(80 MG) BY MOUTH DAILY AT 6 PM   Collagen Hydrolysate, Bovine, POWD Take 1 capsule by mouth daily.   cyanocobalamin (VITAMIN B12) 250 MCG tablet Take 250 mcg by mouth daily.   diphenhydrAMINE  (BENADRYL ) 50 MG capsule Take 50 mg by mouth at bedtime as needed for sleep.   ezetimibe  (ZETIA ) 10 MG tablet TAKE 1 TABLET(10 MG) BY MOUTH DAILY   gabapentin  (NEURONTIN ) 100 MG capsule Take 1 capsule (100 mg total) by mouth at bedtime. TAKE 1 TO 3 CAPSULES(100 TO 300 MG) BY MOUTH AT BEDTIME   glucose blood (ACCU-CHEK GUIDE) test strip Use as instructed   isosorbide  mononitrate (IMDUR ) 60 MG 24 hr tablet  Take 60 mg by mouth daily.   metoprolol  succinate (TOPROL  XL) 25 MG 24 hr tablet Take 1.5 tablets (37.5 mg total) by mouth daily.   Multiple Vitamin (MULTIVITAMIN WITH MINERALS) TABS tablet Take 1 tablet by mouth at bedtime. Centrum Silver for Women   nitroGLYCERIN  (NITROSTAT ) 0.4 MG SL tablet Place 1 tablet (0.4 mg total) under the tongue every 5 (five) minutes as needed for chest pain.   omeprazole (PRILOSEC) 20 MG capsule Take 20 mg by mouth daily before breakfast.    SitaGLIPtin -MetFORMIN  HCl (JANUMET  XR) 50-1000 MG TB24 Take 2 tablets by mouth every morning.   ticagrelor  (BRILINTA ) 90 MG TABS tablet Take 1 tablet (90 mg total) by mouth 2 (two) times daily.     Allergies:   Plavix [clopidogrel bisulfate], Adhesive [tape], and Latex   Social History   Socioeconomic History   Marital status: Married  Spouse name: Not on file   Number of children: Not on file   Years of education: Not on file   Highest education level: Some college, no degree  Occupational History   Not on file  Tobacco Use   Smoking status: Former    Current packs/day: 0.00    Average packs/day: 1 pack/day for 29.0 years (29.0 ttl pk-yrs)    Types: Cigarettes    Start date: 06/14/1977    Quit date: 06/14/2006    Years since quitting: 18.0    Passive exposure: Past   Smokeless tobacco: Never  Vaping Use   Vaping status: Never Used  Substance and Sexual Activity   Alcohol  use: No   Drug use: No   Sexual activity: Yes    Birth control/protection: None  Other Topics Concern   Not on file  Social History Narrative   Marital status: married      Children: grown children; 9 grandchildren; 1 gg      Lives: with husband, 2 birds/[employer] Camera operator      Employment:  [employer] x 18 years; billing      Tobacco: quit smoking 05/2006      Alcohol :  None     Exercise: walking 30 minutes per day   Social Drivers of Health   Financial Resource Strain: Low Risk  (06/20/2024)   Overall Financial Resource Strain  (CARDIA)    Difficulty of Paying Living Expenses: Not very hard  Food Insecurity: No Food Insecurity (06/20/2024)   Hunger Vital Sign    Worried About Running Out of Food in the Last Year: Never true    Ran Out of Food in the Last Year: Never true  Transportation Needs: No Transportation Needs (06/20/2024)   PRAPARE - Administrator, Civil Service (Medical): No    Lack of Transportation (Non-Medical): No  Physical Activity: Sufficiently Active (06/20/2024)   Exercise Vital Sign    Days of Exercise per Week: 5 days    Minutes of Exercise per Session: 30 min  Stress: No Stress Concern Present (06/20/2024)   [employer] of Occupational Health - Occupational Stress Questionnaire    Feeling of Stress: Only a little  Social Connections: Unknown (06/20/2024)   Social Connection and Isolation Panel    Frequency of Communication with Friends and Family: Patient declined    Frequency of Social Gatherings with Friends and Family: Patient declined    Attends Religious Services: Never    Diplomatic Services operational officer: No    Attends Engineer, structural: Not on file    Marital Status: Married     Family History: The patient's family history includes Cancer in her father; Diabetes in her daughter; Drug abuse in her brother and daughter; Heart disease in her daughter and paternal grandmother; Heart disease (age of onset: 68) in her mother; Hyperlipidemia in her brother and mother; Hypertension in her brother; Lupus in her daughter; Stroke in her paternal grandfather.  ROS:   Please see the history of present illness.     All other systems reviewed and are negative.  EKGs/Labs/Other Studies Reviewed:    The following studies were reviewed today: Left Heart Cath 07/20/21   Left Ventricle The left ventricular size is normal. The left ventricular systolic function is normal. LV end diastolic pressure is normal. The left ventricular ejection fraction is 55-65% by  visual estimate. No regional wall motion abnormalities.  Aortic Valve There is no aortic valve stenosis.    Coronary  Diagrams   Diagnostic Dominance: Right  Intervention     Cardiac cath 03/20/24: Procedures  LEFT HEART CATH AND CORONARY ANGIOGRAPHY  CORONARY LITHOTRIPSY  CORONARY BALLOON ANGIOPLASTY   Conclusion      RPDA lesion is 50% stenosed.   2nd RPL lesion is 100% stenosed.   1st Mrg-1 lesion is 90% stenosed.   1st Mrg-2 lesion is 80% stenosed.   Non-stenotic Mid LAD lesion was previously treated.   Non-stenotic Prox RCA lesion was previously treated.   Non-stenotic Mid RCA to Dist RCA lesion was previously treated.   Drug-coated balloon angioplasty was performed using a BALL DC AGENT 3.0X20.   Drug-coated balloon angioplasty was performed using a BALLOON SCOREFLEX 2.50X15.   Post intervention, there is a 0% residual stenosis.   Post intervention, there is a 0% residual stenosis.   The left ventricular systolic function is normal.   LV end diastolic pressure is normal.   The left ventricular ejection fraction is 55-65% by visual estimate.   Recommend uninterrupted dual antiplatelet therapy with Aspirin  81mg  daily and Ticagrelor  90mg  twice daily for a minimum of 6 months (stable ischemic heart disease-Class I recommendation).   2 vessel obstructive CAD involving instent restenosis in 2 areas in the first OM. Stents in the LAD and throughout the RCA are patent. 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  Normal LV function Normal LVEDP Successful PCI of the first OM with Scoring balloon, ICL Shockwave therapy, high pressure noncompliant balloon followed by Drug coated balloon therapy   Plan: DAPT with Brilinta  90 mg bid, ASA 81 mg daily for at least 6 months. Then long term therapy with Brilinta  60 mg bid. Anticipate same day DC            Recent Labs: 01/23/2024: TSH 4.470 03/18/2024: ALT 24 03/21/2024: BUN 15; Creatinine, Ser 0.78; Hemoglobin 12.2; Platelets  195; Potassium 4.5; Sodium 138  Recent Lipid Panel    Component Value Date/Time   CHOL 120 03/18/2024 1133   CHOL 127 10/28/2014 0801   TRIG 141 03/18/2024 1133   TRIG 192 (H) 10/28/2014 0801   HDL 36 (L) 03/18/2024 1133   HDL 30 (L) 10/28/2014 0801   CHOLHDL 3.3 03/18/2024 1133   CHOLHDL 3.8 08/08/2016 0731   VLDL 22 08/08/2016 0731   LDLCALC 59 03/18/2024 1133   LDLCALC 59 10/28/2014 0801     Risk Assessment/Calculations:                Physical Exam:    VS:  BP (!) 106/52   Pulse 68   Ht 5' 2.5 (1.588 m)   Wt 163 lb (73.9 kg)   SpO2 96%   BMI 29.34 kg/m     Wt Readings from Last 3 Encounters:  07/05/24 163 lb (73.9 kg)  06/24/24 166 lb 8 oz (75.5 kg)  04/04/24 161 lb (73 kg)     GEN:  Well nourished, well developed in no acute distress HEENT: Normal NECK: No JVD; No carotid bruits LYMPHATICS: No lymphadenopathy CARDIAC: RRR, no murmurs, rubs, gallops RESPIRATORY:  Clear to auscultation without rales, wheezing or rhonchi  ABDOMEN: Soft, non-tender, non-distended MUSCULOSKELETAL:  No edema; No deformity  SKIN: Warm and dry NEUROLOGIC:  Alert and oriented x 3 PSYCHIATRIC:  Normal affect   ASSESSMENT:    1. Coronary artery disease involving native coronary artery of native heart without angina pectoris   2. Type 2 diabetes mellitus with other circulatory complication, without long-term current use of insulin  (HCC)  3. Essential hypertension   4. Hyperlipidemia LDL goal <70      PLAN:    In order of problems listed above:  1.  CAD/old MI -s/p multiple PCIs over the years. treated with scoring balloon PCI of the LCx in Sept 2022. Recent cath showed restenosis treated with Shockwave therapy and drug coated balloon.  - now angina free She is on optimal medical therapy with Toprol  XL 37.5 mg daily, Imdur  60 mg daily, amlodipine  5 mg bid.  On DAPT Follow up in 6 months.   2.  Hyperlipidemia -Last LDL was 59, at goal -Would continue current  medication regimen - update labs today   3.  Diabetes -A1c 5.6%   4.  Hypertensive heart disease -Blood pressure well-controlled today  -Continue low-sodium, heart healthy diet              Medication Adjustments/Labs and Tests Ordered: Current medicines are reviewed at length with the patient today.  Concerns regarding medicines are outlined above.  No orders of the defined types were placed in this encounter.  No orders of the defined types were placed in this encounter.   Patient Instructions  Medication Instructions:  Continue same medications *If you need a refill on your cardiac medications before your next appointment, please call your pharmacy*  Lab Work: None ordered  Testing/Procedures: None ordered  Follow-Up: At [facility], you and your health needs are our priority.  As part of our continuing mission to provide you with exceptional heart care, our providers are all part of one team.  This team includes your primary Cardiologist (physician) and Advanced Practice Providers or APPs (Physician Assistants and Nurse Practitioners) who all work together to provide you with the care you need, when you need it.  Your next appointment:  6 months   Call in Nov to schedule March appointment    Provider:  Dr.Anye Brose   We recommend signing up for the patient portal called MyChart.  Sign up information is provided on this After Visit Summary.  MyChart is used to connect with patients for Virtual Visits (Telemedicine).  Patients are able to view lab/test results, encounter notes, upcoming appointments, etc.  Non-urgent messages can be sent to your provider as well.   To learn more about what you can do with MyChart, go to [email or web address].          Signed, Sherley Mckenney [place], MD  07/05/2024 9:36 AM    [facility]

## 2024-07-03 ENCOUNTER — Other Ambulatory Visit (HOSPITAL_BASED_OUTPATIENT_CLINIC_OR_DEPARTMENT_OTHER): Payer: Self-pay | Admitting: *Deleted

## 2024-07-03 DIAGNOSIS — G6289 Other specified polyneuropathies: Secondary | ICD-10-CM

## 2024-07-03 MED ORDER — GABAPENTIN 100 MG PO CAPS: 100.0000 mg | ORAL_CAPSULE | Freq: Every day | ORAL | 0 refills | Status: DC

## 2024-07-05 ENCOUNTER — Ambulatory Visit: Attending: Cardiology | Admitting: Cardiology

## 2024-07-05 ENCOUNTER — Encounter: Payer: Self-pay | Admitting: Cardiology

## 2024-07-05 VITALS — BP 106/52 | HR 68 | Ht 62.5 in | Wt 163.0 lb

## 2024-07-05 DIAGNOSIS — E1159 Type 2 diabetes mellitus with other circulatory complications: Secondary | ICD-10-CM | POA: Diagnosis not present

## 2024-07-05 DIAGNOSIS — I10 Essential (primary) hypertension: Secondary | ICD-10-CM | POA: Diagnosis not present

## 2024-07-05 DIAGNOSIS — E785 Hyperlipidemia, unspecified: Secondary | ICD-10-CM

## 2024-07-05 DIAGNOSIS — I2510 Atherosclerotic heart disease of native coronary artery without angina pectoris: Secondary | ICD-10-CM

## 2024-07-05 NOTE — Patient Instructions (Signed)

## 2024-07-07 ENCOUNTER — Encounter (HOSPITAL_BASED_OUTPATIENT_CLINIC_OR_DEPARTMENT_OTHER): Payer: Self-pay | Admitting: Family Medicine

## 2024-07-08 ENCOUNTER — Other Ambulatory Visit (HOSPITAL_BASED_OUTPATIENT_CLINIC_OR_DEPARTMENT_OTHER): Payer: Self-pay | Admitting: *Deleted

## 2024-07-08 DIAGNOSIS — Z23 Encounter for immunization: Secondary | ICD-10-CM

## 2024-07-08 MED ORDER — COVID-19 MRNA VACCINE (PFIZER) 30 MCG/0.3ML IM SUSP: 0.3000 mL | Freq: Once | INTRAMUSCULAR | 0 refills | Status: AC

## 2024-07-08 NOTE — Addendum Note (Signed)
 Addended by: HERMINE LATUS R on: 07/08/2024 08:52 AM   Modules accepted: Orders

## 2024-07-12 DIAGNOSIS — Z961 Presence of intraocular lens: Secondary | ICD-10-CM | POA: Diagnosis not present

## 2024-07-14 ENCOUNTER — Encounter (HOSPITAL_BASED_OUTPATIENT_CLINIC_OR_DEPARTMENT_OTHER): Payer: Self-pay | Admitting: Family Medicine

## 2024-10-10 NOTE — Progress Notes (Signed)
 Suzanne Watkins                                          MRN: [id number]   10/10/2024   The [facility]BCI Quality Team Specialist reviewed this patient medical record for the purposes of chart review for care gap closure. The following were reviewed: abstraction for care gap closure-controlling blood pressure.    VBCI Quality Team

## 2024-10-14 ENCOUNTER — Other Ambulatory Visit: Payer: Self-pay | Admitting: Cardiology

## 2024-10-15 ENCOUNTER — Other Ambulatory Visit (HOSPITAL_BASED_OUTPATIENT_CLINIC_OR_DEPARTMENT_OTHER): Payer: Self-pay | Admitting: *Deleted

## 2024-10-15 DIAGNOSIS — G6289 Other specified polyneuropathies: Secondary | ICD-10-CM

## 2024-10-15 MED ORDER — GABAPENTIN 100 MG PO CAPS: 100.0000 mg | ORAL_CAPSULE | Freq: Every day | ORAL | 0 refills | Status: AC

## 2024-10-17 ENCOUNTER — Encounter: Payer: Self-pay | Admitting: Cardiology

## 2024-10-17 MED ORDER — TICAGRELOR 90 MG PO TABS: 90.0000 mg | ORAL_TABLET | Freq: Two times a day (BID) | ORAL | 1 refills | Status: AC

## 2024-11-12 ENCOUNTER — Telehealth: Payer: Self-pay

## 2024-11-12 NOTE — Telephone Encounter (Signed)
 I can let her know that she can see Dr. Vita or Suzanne Watkins.    Copied from CRM [id number]. Topic: Appointments - Scheduling Inquiry for Clinic >> Nov 12, 2024 11:00 AM Suzanne Watkins wrote: Reason for CRM: Patient states she used be a patient with Suzanne Watkins before she moved, and really wants to be established once more as her patient. Let patient know Dr Oris is not accepting new patients, but patient request to reach and speak to Dr Oris

## 2024-12-25 ENCOUNTER — Ambulatory Visit (HOSPITAL_BASED_OUTPATIENT_CLINIC_OR_DEPARTMENT_OTHER): Admitting: Family Medicine

## 2025-01-03 ENCOUNTER — Ambulatory Visit: Admitting: Cardiology
